# Patient Record
Sex: Female | Born: 1970 | Race: White | Hispanic: No | Smoking: Current every day smoker
Health system: Southern US, Community
[De-identification: ages and names within clinical notes are randomized; demographics above are authoritative.]

## PROBLEM LIST (undated history)

## (undated) DIAGNOSIS — J9819 Other pulmonary collapse: Secondary | ICD-10-CM

## (undated) DIAGNOSIS — F329 Major depressive disorder, single episode, unspecified: Secondary | ICD-10-CM

## (undated) DIAGNOSIS — E7800 Pure hypercholesterolemia, unspecified: Secondary | ICD-10-CM

## (undated) DIAGNOSIS — J449 Chronic obstructive pulmonary disease, unspecified: Secondary | ICD-10-CM

## (undated) DIAGNOSIS — F32A Depression, unspecified: Secondary | ICD-10-CM

## (undated) DIAGNOSIS — I10 Essential (primary) hypertension: Secondary | ICD-10-CM

## (undated) HISTORY — PX: ENDOMETRIAL ABLATION: SHX621

## (undated) HISTORY — PX: CHEST TUBE INSERTION: SHX231

---

## 2018-01-19 ENCOUNTER — Emergency Department (HOSPITAL_COMMUNITY): Payer: BLUE CROSS/BLUE SHIELD

## 2018-01-19 ENCOUNTER — Emergency Department (HOSPITAL_COMMUNITY)
Admission: EM | Admit: 2018-01-19 | Discharge: 2018-01-19 | Disposition: A | Discharge status: Home / Self Care | Payer: BLUE CROSS/BLUE SHIELD | Attending: Emergency Medicine | Admitting: Emergency Medicine

## 2018-01-19 ENCOUNTER — Encounter (HOSPITAL_COMMUNITY): Payer: Self-pay | Admitting: *Deleted

## 2018-01-19 ENCOUNTER — Other Ambulatory Visit: Payer: Self-pay

## 2018-01-19 DIAGNOSIS — E119 Type 2 diabetes mellitus without complications: Secondary | ICD-10-CM | POA: Insufficient documentation

## 2018-01-19 DIAGNOSIS — I10 Essential (primary) hypertension: Secondary | ICD-10-CM | POA: Insufficient documentation

## 2018-01-19 DIAGNOSIS — Z79899 Other long term (current) drug therapy: Secondary | ICD-10-CM | POA: Diagnosis not present

## 2018-01-19 DIAGNOSIS — R079 Chest pain, unspecified: Secondary | ICD-10-CM

## 2018-01-19 DIAGNOSIS — R0789 Other chest pain: Secondary | ICD-10-CM | POA: Insufficient documentation

## 2018-01-19 DIAGNOSIS — F17200 Nicotine dependence, unspecified, uncomplicated: Secondary | ICD-10-CM | POA: Diagnosis not present

## 2018-01-19 HISTORY — DX: Essential (primary) hypertension: I10

## 2018-01-19 HISTORY — DX: Major depressive disorder, single episode, unspecified: F32.9

## 2018-01-19 HISTORY — DX: Depression, unspecified: F32.A

## 2018-01-19 HISTORY — DX: Other pulmonary collapse: J98.19

## 2018-01-19 HISTORY — DX: Pure hypercholesterolemia, unspecified: E78.00

## 2018-01-19 LAB — BASIC METABOLIC PANEL
ANION GAP: 10 (ref 5–15)
BUN: 12 mg/dL (ref 6–20)
CO2: 25 mmol/L (ref 22–32)
Calcium: 8.8 mg/dL — ABNORMAL LOW (ref 8.9–10.3)
Chloride: 102 mmol/L (ref 98–111)
Creatinine, Ser: 0.91 mg/dL (ref 0.44–1.00)
GFR calc Af Amer: >60 mL/min (ref >60)
GFR calc non Af Amer: >60 mL/min (ref >60)
GLUCOSE: 106 mg/dL — AB (ref 70–99)
POTASSIUM: 3.2 mmol/L — AB (ref 3.5–5.1)
Sodium: 137 mmol/L (ref 135–145)

## 2018-01-19 LAB — CBC WITH DIFFERENTIAL/PLATELET
BASOS ABS: 0.1 10*3/uL (ref 0.0–0.1)
Basophils Relative: 1 %
Eosinophils Absolute: 0.2 10*3/uL (ref 0.0–0.7)
Eosinophils Relative: 3 %
HEMATOCRIT: 40.6 % (ref 36.0–46.0)
Hemoglobin: 13.9 g/dL (ref 12.0–15.0)
LYMPHS PCT: 30 %
Lymphs Abs: 2.1 10*3/uL (ref 0.7–4.0)
MCH: 32.6 pg (ref 26.0–34.0)
MCHC: 34.2 g/dL (ref 30.0–36.0)
MCV: 95.1 fL (ref 78.0–100.0)
MONO ABS: 0.5 10*3/uL (ref 0.1–1.0)
MONOS PCT: 7 %
NEUTROS ABS: 4.2 10*3/uL (ref 1.7–7.7)
Neutrophils Relative %: 59 %
Platelets: 288 10*3/uL (ref 150–400)
RBC: 4.27 MIL/uL (ref 3.87–5.11)
RDW: 13.9 % (ref 11.5–15.5)
WBC: 7 10*3/uL (ref 4.0–10.5)

## 2018-01-19 LAB — TROPONIN I: Troponin I: <0.03 ng/mL (ref <0.03)

## 2018-01-19 NOTE — ED Triage Notes (Signed)
Pt c/o left chest pain that goes into the left axilla along with SOB, nausea, dizziness x 1 week. Pt reports her Klonopin was recently decreased and she feels this may be related. Pt reports she also changed job positions 2 months ago and has been doing heavy lifting.

## 2018-01-19 NOTE — Discharge Instructions (Addendum)
To help treat the pain, take ibuprofen 3 times a day with meals.  We will also help to use heat on the sore area 3 or 4 times a day.  Additionally stopping smoking will likely improve your discomfort.  If you began coughing or develop a fever, please see your doctor for further treatment.  Avoid lifting as much as possible for several days.

## 2018-01-19 NOTE — ED Provider Notes (Signed)
[facility] EMERGENCY DEPARTMENT Provider Note   CSN: [id number] Arrival date & time: 01/19/18  1841     History   Chief Complaint Chief Complaint  Patient presents with  . Chest Pain    HPI Sherry [name] is a 47 y.o. female.  HPI   She presents for evaluation of pain left axilla, fairly constant for 4 days.  No specific known trauma although she changed her job about 2 weeks ago and now is lifting more than usual.  She denies fever, chills, cough, persistent shortness of breath.  There is no change in her pain going from supine to sitting.  She denies nausea, vomiting, weakness or dizziness.  No prior similar problem.  She smokes cigarettes.  She is taking her usual medications as prescribed.  She has a history of "lung collapse, left-sided.  She denies breast tenderness, nipple discharge or mass.  She states she had a normal mammogram, 2 months ago.  There are no other known modifying factors.  Past Medical History:  Diagnosis Date  . Depression   . Diabetes mellitus without complication ([place])   . High cholesterol   . Hypertension   . Lung collapse    left    There are no active problems to display for this patient.   Past Surgical History:  Procedure Laterality Date  . ENDOMETRIAL ABLATION       OB History   None      Home Medications    Prior to Admission medications   Medication Sig Start Date End Date Taking? Authorizing Provider  acetaminophen (TYLENOL) 650 MG CR tablet Take by mouth.   Yes [provider]  amitriptyline (ELAVIL) 50 MG tablet Take 50 mg by mouth daily.  01/07/16  Yes [provider]  clonazePAM (KLONOPIN) 0.5 MG tablet TK SS T PO QAM AND 1 T PO QHS 01/04/18  Yes [provider]  hydrochlorothiazide (HYDRODIURIL) 25 MG tablet Take 25 mg by mouth daily.  02/11/16  Yes [provider]  ibuprofen (ADVIL,MOTRIN) 200 MG tablet Take 200 mg by mouth every 6 (six) hours as needed for headache or mild pain.   Yes  [provider]  magnesium 30 MG tablet Take 250 mg by mouth daily.   Yes [provider]  pantoprazole (PROTONIX) 40 MG tablet TAKE (1) TABLET DAILY 12/09/15  Yes [provider]  QUEtiapine (SEROQUEL) 100 MG tablet Take 100 mg by mouth at bedtime.  02/11/16  Yes [provider]  rosuvastatin (CRESTOR) 40 MG tablet Take 40 mg by mouth daily.  02/10/16  Yes [provider]    Family History No family history on file.  Social History Social History   Tobacco Use  . Smoking status: Current Every Day Smoker  . Smokeless tobacco: Never Used  Substance Use Topics  . Alcohol use: Yes    Comment: occasionally   . Drug use: Never     Allergies   Dimenhydrinate   Review of Systems Review of Systems  All other systems reviewed and are negative.    Physical Exam Updated Vital Signs BP 110/76   Pulse 68   Temp 97.9 F (36.6 C) (Oral)   Resp 18   Ht 5\' 5"  (1.651 m)   Wt 64.4 kg (142 lb)   SpO2 100%   BMI 23.63 kg/m   Physical Exam  Constitutional: She is oriented to person, place, and time. She appears well-developed and well-nourished. No distress.  HENT:  Head: Normocephalic and atraumatic.  Right Ear: External ear normal.  Left Ear: External ear normal.  Eyes: Pupils are equal, round, and reactive to light. Conjunctivae and EOM are normal.  Neck: Normal range of motion and phonation normal. Neck supple.  Cardiovascular: Normal rate, regular rhythm and normal heart sounds.  Pulmonary/Chest: Effort normal and breath sounds normal. No respiratory distress. She exhibits tenderness (Moderate left upper and left axillary tenderness along the chest wall.  No associated deformity.). She exhibits no bony tenderness.  Abdominal: Soft. There is no tenderness.  Musculoskeletal: Normal range of motion. She exhibits no edema, tenderness or deformity.  Neurological: She is alert and oriented to person, place, and time. No cranial nerve deficit  or sensory deficit. She exhibits normal muscle tone. Coordination normal.  Skin: Skin is warm, dry and intact.  Psychiatric: She has a normal mood and affect. Her behavior is normal. Judgment and thought content normal.  Nursing note and vitals reviewed.    ED Treatments / Results  Labs (all labs ordered are listed, but only abnormal results are displayed) Labs Reviewed  BASIC METABOLIC PANEL - Abnormal; Notable for the following components:      Result Value   Potassium 3.2 (*)    Glucose, Bld 106 (*)    Calcium 8.8 (*)    All other components within normal limits  TROPONIN I  CBC WITH DIFFERENTIAL/PLATELET    EKG EKG Interpretation  Date/Time:  Thursday January 19 2018 18:52:09 EDT Ventricular Rate:  79 PR Interval:    QRS Duration: 94 QT Interval:  409 QTC Calculation: 469 R Axis:   92 Text Interpretation:  Sinus rhythm Consider left atrial enlargement Borderline right axis deviation Borderline T abnormalities, anterior leads No old tracing to compare Confirmed by Daleen Bo [phone]) on 01/19/2018 8:27:06 PM   Radiology Dg Chest 2 View  Result Date: 01/19/2018 CLINICAL DATA:  left chest pain that goes into the left axilla along with SOB, nausea, dizziness x 1 week. History of DM, HTN, collapsed lung. EXAM: CHEST - 2 VIEW COMPARISON:  None. FINDINGS: Midline trachea. Normal heart size and mediastinal contours. No pleural effusion or pneumothorax. Mild biapical pleural thickening. Moderate diffuse pulmonary interstitial thickening. No lobar consolidation. IMPRESSION: No acute cardiopulmonary disease. Peribronchial thickening which may relate to chronic bronchitis or smoking. Electronically Signed   By: Abigail Miyamoto M.D.   On: 01/19/2018 20:04    Procedures Procedures (including critical care time)  Medications Ordered in ED Medications - No data to display   Initial Impression / Assessment and Plan / ED Course  I have reviewed the triage vital signs and the nursing  notes.  Pertinent labs & imaging results that were available during my care of the patient were reviewed by me and considered in my medical decision making (see chart for details).  Clinical Course as of Jan 20 2123  Thu Jan 19, 2018  2026 Normal except potassium low, glucose high, calcium low  Basic metabolic panel(!) [EW]  6433 Normal  Troponin I [EW]  2026 Normal  CBC with Differential [EW]  2026 Abnormal, consistent with bronchitis  DG Chest 2 View [EW]  2034 Normal  Basic metabolic panel(!) [EW]  2951 Normal  Troponin I [EW]  2034 normal  CBC with Differential [EW]    Clinical Course User Index [EW] Daleen Bo, MD     Patient Vitals for the past 24 hrs:  BP Temp Temp src Pulse Resp SpO2 Height Weight  01/19/18 2030 110/76 - - 68 18 100 % - -  01/19/18 2000 102/71 - - 70 15 98 % - -  01/19/18 1900 120/83 - - 77 17 100 % - -  01/19/18 1853 114/77 97.9 F (36.6 C) Oral 80 20 98 % - -  01/19/18 1852 - - - - - - 5\' 5"  (1.651 m) 64.4 kg (142 lb)    At discharge- reevaluation with update and discussion. After initial assessment and treatment, an updated evaluation reveals she is comfortable has no further complaints.  Findings discussed and questions answered. Daleen Bo   Medical Decision Making: Nonspecific chest pain, doubt ACS, PE or pneumonia.  Patient is a smoker.  No good evidence for bronchitis at this time.  Likely component of chest wall pain, related to work efforts.  CRITICAL CARE-no Performed by: Daleen Bo   Nursing Notes Reviewed/ Care Coordinated Applicable Imaging Reviewed Interpretation of Laboratory Data incorporated into ED treatment  The patient appears reasonably screened and/or stabilized for discharge and I doubt any other medical condition or other [facility] requiring further screening, evaluation, or treatment in the ED at this time prior to discharge.  Plan: Home Medications-continue usual medications, ibuprofen for pain relief; Home  Treatments-heat to affected area; return here if the recommended treatment, does not improve the symptoms; Recommended follow up-PCP, as needed    Final Clinical Impressions(s) / ED Diagnoses   Final diagnoses:  Nonspecific chest pain    ED Discharge Orders    None       Daleen Bo, MD 01/19/18 2125

## 2018-02-26 ENCOUNTER — Encounter (HOSPITAL_COMMUNITY): Payer: Self-pay | Admitting: *Deleted

## 2018-02-26 ENCOUNTER — Emergency Department (HOSPITAL_COMMUNITY)
Admission: EM | Admit: 2018-02-26 | Discharge: 2018-02-26 | Disposition: A | Discharge status: Home / Self Care | Payer: BLUE CROSS/BLUE SHIELD | Attending: Emergency Medicine | Admitting: Emergency Medicine

## 2018-02-26 ENCOUNTER — Emergency Department (HOSPITAL_COMMUNITY): Payer: BLUE CROSS/BLUE SHIELD

## 2018-02-26 ENCOUNTER — Other Ambulatory Visit: Payer: Self-pay

## 2018-02-26 DIAGNOSIS — R0789 Other chest pain: Secondary | ICD-10-CM | POA: Diagnosis not present

## 2018-02-26 DIAGNOSIS — F17210 Nicotine dependence, cigarettes, uncomplicated: Secondary | ICD-10-CM | POA: Diagnosis not present

## 2018-02-26 DIAGNOSIS — I10 Essential (primary) hypertension: Secondary | ICD-10-CM | POA: Insufficient documentation

## 2018-02-26 DIAGNOSIS — Z79899 Other long term (current) drug therapy: Secondary | ICD-10-CM | POA: Insufficient documentation

## 2018-02-26 DIAGNOSIS — R079 Chest pain, unspecified: Secondary | ICD-10-CM | POA: Diagnosis present

## 2018-02-26 DIAGNOSIS — E7800 Pure hypercholesterolemia, unspecified: Secondary | ICD-10-CM | POA: Insufficient documentation

## 2018-02-26 LAB — TROPONIN I: Troponin I: <0.03 ng/mL (ref <0.03)

## 2018-02-26 LAB — BASIC METABOLIC PANEL
Anion gap: 8 (ref 5–15)
BUN: 14 mg/dL (ref 6–20)
CO2: 28 mmol/L (ref 22–32)
CREATININE: 0.74 mg/dL (ref 0.44–1.00)
Calcium: 9.2 mg/dL (ref 8.9–10.3)
Chloride: 103 mmol/L (ref 98–111)
GFR calc non Af Amer: >60 mL/min (ref >60)
GLUCOSE: 102 mg/dL — AB (ref 70–99)
Potassium: 3.4 mmol/L — ABNORMAL LOW (ref 3.5–5.1)
Sodium: 139 mmol/L (ref 135–145)

## 2018-02-26 LAB — CBC
HCT: 42.3 % (ref 36.0–46.0)
Hemoglobin: 14.2 g/dL (ref 12.0–15.0)
MCH: 32.2 pg (ref 26.0–34.0)
MCHC: 33.6 g/dL (ref 30.0–36.0)
MCV: 95.9 fL (ref 78.0–100.0)
Platelets: 298 10*3/uL (ref 150–400)
RBC: 4.41 MIL/uL (ref 3.87–5.11)
RDW: 13.9 % (ref 11.5–15.5)
WBC: 7.3 10*3/uL (ref 4.0–10.5)

## 2018-02-26 LAB — LIPASE, BLOOD: Lipase: 30 U/L (ref 11–51)

## 2018-02-26 MED ORDER — ONDANSETRON HCL 4 MG/2ML IJ SOLN
4.0000 mg | Freq: Once | INTRAMUSCULAR | Status: AC
  Administered 2018-02-26: 4 mg via INTRAVENOUS
  Filled 2018-02-26: qty 2

## 2018-02-26 MED ORDER — IBUPROFEN 600 MG PO TABS: 600.0000 mg | ORAL_TABLET | Freq: Four times a day (QID) | ORAL | 0 refills | Status: DC | PRN

## 2018-02-26 MED ORDER — MORPHINE SULFATE (PF) 4 MG/ML IV SOLN
4.0000 mg | Freq: Once | INTRAVENOUS | Status: AC
  Administered 2018-02-26: 4 mg via INTRAVENOUS
  Filled 2018-02-26: qty 1

## 2018-02-26 MED ORDER — KETOROLAC TROMETHAMINE 30 MG/ML IJ SOLN
30.0000 mg | Freq: Once | INTRAMUSCULAR | Status: AC
  Administered 2018-02-26: 30 mg via INTRAVENOUS
  Filled 2018-02-26: qty 1

## 2018-02-26 MED ORDER — HYDROCODONE-ACETAMINOPHEN 5-325 MG PO TABS: 1.0000 | ORAL_TABLET | ORAL | 0 refills | Status: DC | PRN

## 2018-02-26 NOTE — ED Provider Notes (Signed)
[facility] EMERGENCY DEPARTMENT Provider Note   CSN: [id number] Arrival date & time: 02/26/18  1544     History   Chief Complaint Chief Complaint  Patient presents with  . Chest Pain    HPI Sherry [name] is a 47 y.o. female.  Pt presents to the ED today with CP.  The pt said it started yesterday when she was getting into a friend's pool.  She was climbing down a ladder, but facing away from the ladder.  She felt a sudden pain as she was climbing.  It hurts with movement and with a deep breath.  She has never had anything like this in the past.     Past Medical History:  Diagnosis Date  . Depression   . High cholesterol   . Hypertension   . Lung collapse    left    There are no active problems to display for this patient.   Past Surgical History:  Procedure Laterality Date  . CHEST TUBE INSERTION    . ENDOMETRIAL ABLATION       OB History   None      Home Medications    Prior to Admission medications   Medication Sig Start Date End Date Taking? Authorizing Provider  acetaminophen (TYLENOL) 650 MG CR tablet Take by mouth.    [provider]  amitriptyline (ELAVIL) 50 MG tablet Take 50 mg by mouth daily.  01/07/16   [provider]  clonazePAM (KLONOPIN) 0.5 MG tablet TK SS T PO QAM AND 1 T PO QHS 01/04/18   [provider]  hydrochlorothiazide (HYDRODIURIL) 25 MG tablet Take 25 mg by mouth daily.  02/11/16   [provider]  HYDROcodone-acetaminophen (NORCO/VICODIN) 5-325 MG tablet Take 1 tablet by mouth every 4 (four) hours as needed. 02/26/18   Isla Pence, MD  ibuprofen (ADVIL,MOTRIN) 600 MG tablet Take 1 tablet (600 mg total) by mouth every 6 (six) hours as needed. 02/26/18   Isla Pence, MD  magnesium 30 MG tablet Take 250 mg by mouth daily.    [provider]  pantoprazole (PROTONIX) 40 MG tablet TAKE (1) TABLET DAILY 12/09/15   [provider]  QUEtiapine (SEROQUEL) 100 MG tablet Take 100 mg by mouth  at bedtime.  02/11/16   [provider]  rosuvastatin (CRESTOR) 40 MG tablet Take 40 mg by mouth daily.  02/10/16   [provider]    Family History No family history on file.  Social History Social History   Tobacco Use  . Smoking status: Current Every Day Smoker    Packs/day: 1.00    Types: Cigarettes  . Smokeless tobacco: Never Used  Substance Use Topics  . Alcohol use: Yes    Comment: occasionally   . Drug use: Never     Allergies   Dimenhydrinate   Review of Systems Review of Systems  Cardiovascular: Positive for chest pain.  All other systems reviewed and are negative.    Physical Exam Updated Vital Signs BP 98/63   Pulse 63   Temp 97.8 F (36.6 C) (Oral)   Resp 16   Ht 5' 5.5" (1.664 m)   Wt 61.2 kg   SpO2 96%   BMI 22.12 kg/m   Physical Exam  Constitutional: She is oriented to person, place, and time. She appears well-developed and well-nourished.  HENT:  Head: Normocephalic and atraumatic.  Eyes: Pupils are equal, round, and reactive to light. EOM are normal.  Neck: Normal range of motion. Neck supple.  Cardiovascular: Normal rate and regular rhythm.  Pulmonary/Chest: Effort normal and breath sounds normal.    Abdominal: Soft. Bowel sounds are normal.  Musculoskeletal: Normal range of motion.       Right lower leg: Normal.       Left lower leg: Normal.  Neurological: She is alert and oriented to person, place, and time.  Skin: Skin is warm and dry. Capillary refill takes less than 2 seconds.  Psychiatric: She has a normal mood and affect. Her behavior is normal.  Nursing note and vitals reviewed.    ED Treatments / Results  Labs (all labs ordered are listed, but only abnormal results are displayed) Labs Reviewed  BASIC METABOLIC PANEL - Abnormal; Notable for the following components:      Result Value   Potassium 3.4 (*)    Glucose, Bld 102 (*)    All other components within normal limits  CBC  TROPONIN I  LIPASE,  BLOOD    EKG EKG Interpretation  Date/Time:  Sunday February 26 2018 15:56:51 EDT Ventricular Rate:  78 PR Interval:  146 QRS Duration: 82 QT Interval:  378 QTC Calculation: 430 R Axis:   103 Text Interpretation:  Normal sinus rhythm Right atrial enlargement Rightward axis ST & T wave abnormality, consider anterolateral ischemia Abnormal ECG No significant change since last tracing Confirmed by Jolynda Townley ([id number]) on 02/26/2018 4:02:57 PM   Radiology Dg Chest 2 View  Result Date: 02/26/2018 CLINICAL DATA:  Sharp intermittent midsternal chest pain. EXAM: CHEST - 2 VIEW COMPARISON:  01/19/2018 FINDINGS: Cardiomediastinal silhouette is normal. Mediastinal contours appear intact. There is no evidence of focal airspace consolidation, pleural effusion or pneumothorax. Coarsening of the interstitium and peribronchial thickening, not significantly changed. Osseous structures are without acute abnormality. Soft tissues are grossly normal. IMPRESSION: No lobar consolidation. Coarsening of the interstitium and peribronchial thickening, with chronic appearance likely related to a chronic interstitial lung changes. Electronically Signed   By: Dobrinka  Dimitrova M.D.   On: 02/26/2018 16:59    Procedures Procedures (including critical care time)  Medications Ordered in ED Medications  ketorolac (TORADOL) 30 MG/ML injection 30 mg (has no administration in time range)  morphine 4 MG/ML injection 4 mg (has no administration in time range)  morphine 4 MG/ML injection 4 mg (4 mg Intravenous Given 02/26/18 1630)  ondansetron (ZOFRAN) injection 4 mg (4 mg Intravenous Given 02/26/18 1632)     Initial Impression / Assessment and Plan / ED Course  I have reviewed the triage vital signs and the nursing notes.  Pertinent labs & imaging results that were available during my care of the patient were reviewed by me and considered in my medical decision making (see chart for details).    Pain is very  pleuritic.  Very low suspicion for CAD.  Pt is stable for d/c  Return if worse.  Final Clinical Impressions(s) / ED Diagnoses   Final diagnoses:  Chest wall pain    ED Discharge Orders         Ordered    HYDROcodone-acetaminophen (NORCO/VICODIN) 5-325 MG tablet  Every 4 hours PRN     02/26/18 1713    ibuprofen (ADVIL,MOTRIN) 600 MG tablet  Every 6 hours PRN     08 /18/19 [place], Anacaren Kohan, MD 02/26/18 1734

## 2018-02-26 NOTE — ED Triage Notes (Addendum)
Pt c/o pulling sensation with intermittent sharp pain to mid chest that started yesterday when she was climbing down the stairs into a pool. No radiation of pain. Pt reports SOB when the pain gets really bad. Denies n/v, dizziness.

## 2018-02-26 NOTE — ED Notes (Signed)
Pt in rad 

## 2018-03-06 ENCOUNTER — Emergency Department (HOSPITAL_COMMUNITY): Payer: BLUE CROSS/BLUE SHIELD

## 2018-03-06 ENCOUNTER — Encounter (HOSPITAL_COMMUNITY): Payer: Self-pay | Admitting: Emergency Medicine

## 2018-03-06 ENCOUNTER — Emergency Department (HOSPITAL_COMMUNITY)
Admission: EM | Admit: 2018-03-06 | Discharge: 2018-03-07 | Disposition: A | Discharge status: Home / Self Care | Payer: BLUE CROSS/BLUE SHIELD | Attending: Emergency Medicine | Admitting: Emergency Medicine

## 2018-03-06 ENCOUNTER — Other Ambulatory Visit: Payer: Self-pay

## 2018-03-06 DIAGNOSIS — F17210 Nicotine dependence, cigarettes, uncomplicated: Secondary | ICD-10-CM | POA: Insufficient documentation

## 2018-03-06 DIAGNOSIS — R0789 Other chest pain: Secondary | ICD-10-CM | POA: Diagnosis not present

## 2018-03-06 DIAGNOSIS — Z79899 Other long term (current) drug therapy: Secondary | ICD-10-CM | POA: Insufficient documentation

## 2018-03-06 DIAGNOSIS — I10 Essential (primary) hypertension: Secondary | ICD-10-CM | POA: Insufficient documentation

## 2018-03-06 DIAGNOSIS — R079 Chest pain, unspecified: Secondary | ICD-10-CM | POA: Diagnosis present

## 2018-03-06 LAB — COMPREHENSIVE METABOLIC PANEL
ALT: 11 U/L (ref 0–44)
ANION GAP: 9 (ref 5–15)
AST: 14 U/L — ABNORMAL LOW (ref 15–41)
Albumin: 4.2 g/dL (ref 3.5–5.0)
Alkaline Phosphatase: 70 U/L (ref 38–126)
BILIRUBIN TOTAL: 0.4 mg/dL (ref 0.3–1.2)
BUN: 13 mg/dL (ref 6–20)
CALCIUM: 9.7 mg/dL (ref 8.9–10.3)
CO2: 27 mmol/L (ref 22–32)
Chloride: 103 mmol/L (ref 98–111)
Creatinine, Ser: 0.81 mg/dL (ref 0.44–1.00)
GFR calc non Af Amer: >60 mL/min (ref >60)
Glucose, Bld: 99 mg/dL (ref 70–99)
Potassium: 3.9 mmol/L (ref 3.5–5.1)
Sodium: 139 mmol/L (ref 135–145)
TOTAL PROTEIN: 7.3 g/dL (ref 6.5–8.1)

## 2018-03-06 LAB — CBC
HEMATOCRIT: 41.5 % (ref 36.0–46.0)
Hemoglobin: 14 g/dL (ref 12.0–15.0)
MCH: 32.7 pg (ref 26.0–34.0)
MCHC: 33.7 g/dL (ref 30.0–36.0)
MCV: 97 fL (ref 78.0–100.0)
Platelets: 341 10*3/uL (ref 150–400)
RBC: 4.28 MIL/uL (ref 3.87–5.11)
RDW: 14.6 % (ref 11.5–15.5)
WBC: 10.5 10*3/uL (ref 4.0–10.5)

## 2018-03-06 LAB — TROPONIN I: Troponin I: <0.03 ng/mL (ref <0.03)

## 2018-03-06 NOTE — ED Triage Notes (Signed)
Pt states she was here one week ago because her "chest popped." Pt states that today she was at work and when she moves her chest hurts worse. Pt states the pain feels the same as last week.

## 2018-03-07 LAB — D-DIMER, QUANTITATIVE: D-Dimer, Quant: <0.27 ug/mL-FEU (ref 0.00–0.50)

## 2018-03-07 MED ORDER — TRAMADOL HCL 50 MG PO TABS
100.0000 mg | ORAL_TABLET | Freq: Once | ORAL | Status: AC
  Administered 2018-03-07: 100 mg via ORAL
  Filled 2018-03-07: qty 2

## 2018-03-07 MED ORDER — ONDANSETRON HCL 4 MG PO TABS
4.0000 mg | ORAL_TABLET | Freq: Once | ORAL | Status: AC
  Administered 2018-03-07: 4 mg via ORAL
  Filled 2018-03-07: qty 1

## 2018-03-07 MED ORDER — MELOXICAM 15 MG PO TABS: 15.0000 mg | ORAL_TABLET | Freq: Every day | ORAL | 0 refills | Status: DC

## 2018-03-07 MED ORDER — KETOROLAC TROMETHAMINE 10 MG PO TABS
10.0000 mg | ORAL_TABLET | Freq: Once | ORAL | Status: AC
  Administered 2018-03-07: 10 mg via ORAL
  Filled 2018-03-07: qty 1

## 2018-03-07 MED ORDER — DEXAMETHASONE SODIUM PHOSPHATE 10 MG/ML IJ SOLN
10.0000 mg | Freq: Once | INTRAMUSCULAR | Status: AC
  Administered 2018-03-07: 10 mg via INTRAMUSCULAR
  Filled 2018-03-07: qty 1

## 2018-03-07 MED ORDER — TRAMADOL HCL 50 MG PO TABS: ORAL_TABLET | ORAL | 0 refills | Status: DC

## 2018-03-07 NOTE — ED Provider Notes (Signed)
[facility] EMERGENCY DEPARTMENT Provider Note   CSN: [id number] Arrival date & time: 03/06/18  2116     History   Chief Complaint Chief Complaint  Patient presents with  . Chest Pain    HPI Sherry [name] is a 47 y.o. female.  Patient is a 47 year old female who presents to the emergency department with complaint of chest pain. Patient states that on August 18 she developed pain in her chest.  The pain hurt when she would take a deep breath.  She felt like she felt a pop in her chest.  She was diagnosed with chest wall pain.  She was then evaluated by her primary physician, and taken out of work until today.  The patient states after 3 hours of work today the pain was severe.  The patient states that she has to do a lot of pulling and pushing, and at times she has to lift a few heavy objects.  She says she could not take the pain any longer and came to the emergency department.  She now has pain when she takes a deep breath or when she turns in a certain position.  She presents now for assistance with this pain.  The history is provided by the patient.  Chest Pain   Pertinent negatives include no abdominal pain, no back pain, no cough, no dizziness, no fever, no palpitations and no shortness of breath.  Pertinent negatives for past medical history include no seizures.    Past Medical History:  Diagnosis Date  . Depression   . High cholesterol   . Hypertension   . Lung collapse    left    There are no active problems to display for this patient.   Past Surgical History:  Procedure Laterality Date  . CHEST TUBE INSERTION    . ENDOMETRIAL ABLATION       OB History   None      Home Medications    Prior to Admission medications   Medication Sig Start Date End Date Taking? Authorizing Provider  acetaminophen (TYLENOL) 650 MG CR tablet Take 650 mg by mouth every 8 (eight) hours as needed for pain.    Yes [provider]  amitriptyline (ELAVIL) 50 MG tablet Take  50 mg by mouth every morning.  01/07/16  Yes [provider]  clonazePAM (KLONOPIN) 0.5 MG tablet Take 0.5 mg by mouth 2 (two) times daily.  01/04/18  Yes [provider]  hydrochlorothiazide (HYDRODIURIL) 25 MG tablet Take 25 mg by mouth daily.  02/11/16  Yes [provider]  ibuprofen (ADVIL,MOTRIN) 600 MG tablet Take 1 tablet (600 mg total) by mouth every 6 (six) hours as needed. 02/26/18  Yes Isla Pence, MD  Magnesium 250 MG TABS Take 250 mg by mouth daily as needed (for cramping).    Yes [provider]  pantoprazole (PROTONIX) 40 MG tablet Take 40 mg by mouth daily.  12/09/15  Yes [provider]  QUEtiapine (SEROQUEL) 100 MG tablet Take 100 mg by mouth at bedtime.  02/11/16  Yes [provider]  rosuvastatin (CRESTOR) 40 MG tablet Take 40 mg by mouth every morning.  02/10/16  Yes [provider]  HYDROcodone-acetaminophen (NORCO/VICODIN) 5-325 MG tablet Take 1 tablet by mouth every 4 (four) hours as needed. Patient not taking: Reported on 03/06/2018 02/26/18   Isla Pence, MD    Family History No family history on file.  Social History Social History   Tobacco Use  . Smoking status: Current  Every Day Smoker    Packs/day: 1.00    Types: Cigarettes  . Smokeless tobacco: Never Used  Substance Use Topics  . Alcohol use: Yes    Comment: occasionally   . Drug use: Never     Allergies   Dimenhydrinate   Review of Systems Review of Systems  Constitutional: Negative for activity change, chills and fever.       All ROS Neg except as noted in HPI  HENT: Negative for nosebleeds.   Eyes: Negative for photophobia and discharge.  Respiratory: Negative for cough, shortness of breath and wheezing.   Cardiovascular: Positive for chest pain. Negative for palpitations.  Gastrointestinal: Negative for abdominal pain and blood in stool.  Genitourinary: Negative for dysuria, frequency and hematuria.  Musculoskeletal: Negative for  arthralgias, back pain and neck pain.  Skin: Negative.   Neurological: Negative for dizziness, seizures and speech difficulty.  Psychiatric/Behavioral: Negative for confusion and hallucinations.     Physical Exam Updated Vital Signs BP 112/70 (BP Location: Right Arm)   Pulse 81   Temp 97.8 F (36.6 C) (Oral)   Resp 17   SpO2 98%   Physical Exam  Pulmonary/Chest: She exhibits tenderness. She exhibits no crepitus and no retraction.  Pt's husband in room during exam Chest pain at multiple sites with palpation as well as with taking a deep breath.       ED Treatments / Results  Labs (all labs ordered are listed, but only abnormal results are displayed) Labs Reviewed  COMPREHENSIVE METABOLIC PANEL - Abnormal; Notable for the following components:      Result Value   AST 14 (*)    All other components within normal limits  TROPONIN I  CBC    EKG None  Radiology Dg Chest 2 View  Result Date: 03/06/2018 CLINICAL DATA:  Mid chest pain and shortness of breath for a week. Cough for 2 days. EXAM: CHEST - 2 VIEW COMPARISON:  Chest radiograph February 26, 2018 and January 19, 2018 FINDINGS: Cardiomediastinal silhouette is normal. Similarly coarsened pulmonary interstitium. No pleural effusions or focal consolidations. Trachea projects midline and there is no pneumothorax. Biapical pleural thickening. Soft tissue planes and included osseous structures are non-suspicious. IMPRESSION: Similar coarsened pulmonary interstitium, possible chronic interstitial lung disease. Electronically Signed   By: Elon Alas M.D.   On: 03/06/2018 22:30    Procedures Procedures (including critical care time)  Medications Ordered in ED Medications  dexamethasone (DECADRON) injection 10 mg (has no administration in time range)  traMADol (ULTRAM) tablet 100 mg (has no administration in time range)  ketorolac (TORADOL) tablet 10 mg (has no administration in time range)  ondansetron (ZOFRAN) tablet 4  mg (has no administration in time range)     Initial Impression / Assessment and Plan / ED Course  I have reviewed the triage vital signs and the nursing notes.  Pertinent labs & imaging results that were available during my care of the patient were reviewed by me and considered in my medical decision making (see chart for details).       Final Clinical Impressions(s) / ED Diagnoses MDM  Vital signs are within normal limits.  Pulse oximetry is 98% on room air.  Within normal limits by my interpretation.  Troponin is negative for acute cardiac event.  Complete blood count is negative for acute problem. Comprehensive metabolic panel is well within normal limits. D-dimer is negative for acute event.  Patient feels some better after pain medication.  I reviewed the findings  with the patient in terms which she understands.  Patient was given a note to return to work in 1 week.  I have asked the patient to call her primary doctor tomorrow and set up an appointment for follow-up and recheck.  Patient is in agreement with this plan.    Final diagnoses:  Chest wall pain    ED Discharge Orders         Ordered    traMADol (ULTRAM) 50 MG tablet     03/07/18 0146    meloxicam (MOBIC) 15 MG tablet  Daily     03/07/18 0146           Lily Kocher, PA-C 03/07/18 0159    Ezequiel Essex, MD 03/07/18 [phone]

## 2018-03-07 NOTE — ED Provider Notes (Signed)
ED ECG REPORT   Date: 03/07/2018  Rate: 82  Rhythm: normal sinus rhythm  QRS Axis: normal  Intervals: normal  ST/T Wave abnormalities: nonspecific ST changes  Conduction Disutrbances:none  Narrative Interpretation:   Old EKG Reviewed: unchanged  I have personally reviewed the EKG tracing and agree with the computerized printout as noted.    Ezequiel Essex, MD 03/07/18 [phone]

## 2018-03-07 NOTE — Discharge Instructions (Addendum)
Your chest x-ray is negative for fracture or dislocation.  Your chest x-ray does show some chronic markings probably related to smoking.  Please stop smoking.  No acute problem with review of blood work. Use meloxicam 1 tablet daily with food.  Use Ultram every 6 hours as needed for pain.  Please see Dr. Yong Channel in the office for follow-up concerning your chest wall pain.

## 2018-06-05 ENCOUNTER — Other Ambulatory Visit: Payer: Self-pay

## 2018-06-05 ENCOUNTER — Encounter (HOSPITAL_COMMUNITY): Payer: Self-pay | Admitting: Emergency Medicine

## 2018-06-05 ENCOUNTER — Emergency Department (HOSPITAL_COMMUNITY)
Admission: EM | Admit: 2018-06-05 | Discharge: 2018-06-06 | Disposition: A | Discharge status: Home / Self Care | Payer: Self-pay | Attending: Emergency Medicine | Admitting: Emergency Medicine

## 2018-06-05 ENCOUNTER — Emergency Department (HOSPITAL_COMMUNITY): Payer: Self-pay

## 2018-06-05 DIAGNOSIS — Z760 Encounter for issue of repeat prescription: Secondary | ICD-10-CM | POA: Insufficient documentation

## 2018-06-05 DIAGNOSIS — R0789 Other chest pain: Secondary | ICD-10-CM | POA: Insufficient documentation

## 2018-06-05 DIAGNOSIS — Z79899 Other long term (current) drug therapy: Secondary | ICD-10-CM | POA: Insufficient documentation

## 2018-06-05 DIAGNOSIS — I10 Essential (primary) hypertension: Secondary | ICD-10-CM | POA: Insufficient documentation

## 2018-06-05 DIAGNOSIS — F419 Anxiety disorder, unspecified: Secondary | ICD-10-CM | POA: Insufficient documentation

## 2018-06-05 DIAGNOSIS — F17210 Nicotine dependence, cigarettes, uncomplicated: Secondary | ICD-10-CM | POA: Insufficient documentation

## 2018-06-05 DIAGNOSIS — E7800 Pure hypercholesterolemia, unspecified: Secondary | ICD-10-CM | POA: Insufficient documentation

## 2018-06-05 LAB — CBC
HEMATOCRIT: 47 % — AB (ref 36.0–46.0)
Hemoglobin: 15.1 g/dL — ABNORMAL HIGH (ref 12.0–15.0)
MCH: 31.8 pg (ref 26.0–34.0)
MCHC: 32.1 g/dL (ref 30.0–36.0)
MCV: 98.9 fL (ref 80.0–100.0)
Platelets: 357 10*3/uL (ref 150–400)
RBC: 4.75 MIL/uL (ref 3.87–5.11)
RDW: 13.2 % (ref 11.5–15.5)
WBC: 8 10*3/uL (ref 4.0–10.5)
nRBC: 0 % (ref 0.0–0.2)

## 2018-06-05 LAB — BASIC METABOLIC PANEL
Anion gap: 10 (ref 5–15)
BUN: 10 mg/dL (ref 6–20)
CHLORIDE: 102 mmol/L (ref 98–111)
CO2: 27 mmol/L (ref 22–32)
CREATININE: 0.72 mg/dL (ref 0.44–1.00)
Calcium: 9.4 mg/dL (ref 8.9–10.3)
GFR calc Af Amer: >60 mL/min (ref >60)
GFR calc non Af Amer: >60 mL/min (ref >60)
Glucose, Bld: 109 mg/dL — ABNORMAL HIGH (ref 70–99)
Potassium: 3.4 mmol/L — ABNORMAL LOW (ref 3.5–5.1)
Sodium: 139 mmol/L (ref 135–145)

## 2018-06-05 LAB — TROPONIN I: Troponin I: <0.03 ng/mL (ref <0.03)

## 2018-06-05 MED ORDER — LORAZEPAM 2 MG/ML IJ SOLN
1.0000 mg | Freq: Once | INTRAMUSCULAR | Status: AC
  Administered 2018-06-05: 1 mg via INTRAMUSCULAR
  Filled 2018-06-05: qty 1

## 2018-06-05 NOTE — ED Provider Notes (Signed)
[facility], [place] EMERGENCY DEPARTMENT Provider Note   CSN: [id number] Arrival date & time: 06/05/18  1746     History   Chief Complaint Chief Complaint  Patient presents with  . Chest Pain    HPI Sherry [name] is a 47 y.o. female with a history of hypertension, hypercholesterolemia and bipolar disorder with anxiety and panic attacks presenting with a 4-day history of chest pressure with shortness of breath.  She describes the sensation radiates into her left axilla and also into her upper back and across her bilateral shoulders.  She reports previous similar symptoms with anxiety and panic episodes.  She states she has been increasingly anxious since running out of her Klonopin 4 days ago.  Additionally she ran out of her Zoloft yesterday and the Seroquel which she takes for sleep 3 days ago.  She just received a letter from [facility] where her behavioral health provider is, stating that she will be out of the office until the end of January.  Patient is very upset and concerned about been able to get her medicines refilled.  She has had very little sleep in the past 3 days.  She denies homicidal or suicidal ideation.  Her chest pressure has been constant, not worsening with exertion or positional changes.  She has found no alleviators for her symptoms.  The history is provided by the patient and the spouse.    Past Medical History:  Diagnosis Date  . Depression   . High cholesterol   . Hypertension   . Lung collapse    left    There are no active problems to display for this patient.   Past Surgical History:  Procedure Laterality Date  . CHEST TUBE INSERTION    . ENDOMETRIAL ABLATION       OB History   None      Home Medications    Prior to Admission medications   Medication Sig Start Date End Date Taking? Authorizing Provider  acetaminophen (TYLENOL) 650 MG CR tablet Take 650 mg by mouth every 8 (eight) hours as needed for pain.    Yes [provider]  hydrochlorothiazide (HYDRODIURIL) 25 MG tablet Take 25 mg by mouth every morning.  02/11/16  Yes [provider]  ibuprofen (ADVIL,MOTRIN) 600 MG tablet Take 1 tablet (600 mg total) by mouth every 6 (six) hours as needed. 02/26/18  Yes Isla Pence, MD  Magnesium 250 MG TABS Take 250 mg by mouth daily as needed (for cramping).    Yes [provider]  metoprolol succinate (TOPROL-XL) 50 MG 24 hr tablet Take 50 mg by mouth every morning. Take with or immediately following a meal.   Yes [provider]  pantoprazole (PROTONIX) 40 MG tablet Take 40 mg by mouth every morning.  12/09/15  Yes [provider]  rosuvastatin (CRESTOR) 40 MG tablet Take 40 mg by mouth every morning.  02/10/16  Yes [provider]  clonazePAM (KLONOPIN) 0.5 MG tablet Take 1 tablet (0.5 mg total) by mouth 2 (two) times daily as needed for up to 10 days for anxiety. 06/06/18 06/16/18  Evalee Jefferson, PA-C  HYDROcodone-acetaminophen (NORCO/VICODIN) 5-325 MG tablet Take 1 tablet by mouth every 4 (four) hours as needed. Patient not taking: Reported on 03/06/2018 02/26/18   Isla Pence, MD  QUEtiapine (SEROQUEL) 400 MG tablet Take 1 tablet (400 mg total) by mouth at bedtime. 06/06/18   Evalee Jefferson, PA-C  sertraline (ZOLOFT) 100 MG tablet Take 0.5 tablets (50 mg total)  by mouth daily. 06/06/18   Evalee Jefferson, PA-C    Family History No family history on file.  Social History Social History   Tobacco Use  . Smoking status: Current Every Day Smoker    Packs/day: 0.50    Types: Cigarettes  . Smokeless tobacco: Never Used  Substance Use Topics  . Alcohol use: Yes    Comment: occasionally   . Drug use: Never     Allergies   Dimenhydrinate   Review of Systems Review of Systems  Constitutional: Positive for appetite change. Negative for fever.  HENT: Negative for congestion and sore throat.   Eyes: Negative.  Negative for photophobia and visual disturbance.  Respiratory:  Positive for shortness of breath. Negative for chest tightness.   Cardiovascular: Positive for chest pain.  Gastrointestinal: Negative for abdominal pain, nausea and vomiting.  Genitourinary: Negative.   Musculoskeletal: Negative for arthralgias, joint swelling and neck pain.  Skin: Negative.  Negative for rash and wound.  Neurological: Negative for dizziness, weakness, light-headedness, numbness and headaches.  Psychiatric/Behavioral: Positive for sleep disturbance. Negative for agitation, hallucinations and suicidal ideas. The patient is nervous/anxious.      Physical Exam Updated Vital Signs BP 111/71   Pulse 68   Temp 98 F (36.7 C) (Oral)   Resp 13   Ht 5' 5.5" (1.664 m)   Wt 65.8 kg   SpO2 98%   BMI 23.76 kg/m   Physical Exam  Constitutional: She appears well-developed and well-nourished.  Patient quite anxious and tearful upon first evaluation.  HENT:  Head: Normocephalic and atraumatic.  Eyes: Conjunctivae are normal.  Neck: Normal range of motion.  Cardiovascular: Normal rate, regular rhythm, normal heart sounds and intact distal pulses.  Pulmonary/Chest: Effort normal and breath sounds normal. No respiratory distress. She has no decreased breath sounds. She has no wheezes. She has no rhonchi. She has no rales. She exhibits tenderness.  Reproducible chest pain at left axilla    Abdominal: Soft. Bowel sounds are normal. There is no tenderness.  Musculoskeletal: Normal range of motion.  Neurological: She is alert.  Skin: Skin is warm and dry.  Psychiatric: She has a normal mood and affect.  Nursing note and vitals reviewed.    ED Treatments / Results  Labs (all labs ordered are listed, but only abnormal results are displayed) Labs Reviewed  BASIC METABOLIC PANEL - Abnormal; Notable for the following components:      Result Value   Potassium 3.4 (*)    Glucose, Bld 109 (*)    All other components within normal limits  CBC - Abnormal; Notable for the  following components:   Hemoglobin 15.1 (*)    HCT 47.0 (*)    All other components within normal limits  TROPONIN I    EKG EKG Interpretation  Date/Time:  Monday June 05 2018 18:11:25 EST Ventricular Rate:  82 PR Interval:  150 QRS Duration: 76 QT Interval:  372 QTC Calculation: 434 R Axis:   106 Text Interpretation:  Normal sinus rhythm Possible Left atrial enlargement Rightward axis ST & T wave abnormality, consider anterior ischemia Artifact When compared with ECG of 02/26/2018 No significant change was found Confirmed by Francine Graven [phone]) on 06/05/2018 10:51:59 PM  ED ECG REPORT   Date: 06/05/2018 23:08  Repeated due to artifact  Rate: 66  Rhythm: normal sinus rhythm  QRS Axis: right  Intervals: normal  ST/T Wave abnormalities: nonspecific ST changes  Conduction Disutrbances:none  Narrative Interpretation:   Old EKG Reviewed: unchanged  I have personally reviewed the EKG tracing and agree with the computerized printout as noted.   Radiology Dg Chest 2 View  Result Date: 06/05/2018 CLINICAL DATA:  Chest pain and shortness of breath for 2 days, unable to sleep, history hypertension, smoker EXAM: CHEST - 2 VIEW COMPARISON:  03/06/2018 FINDINGS: Normal heart size, mediastinal contours, and pulmonary vascularity. Lungs emphysematous with minimal biapical scarring. No acute infiltrate, pleural effusion or pneumothorax. Osseous structures unremarkable. IMPRESSION: COPD changes without infiltrate. Electronically Signed   By: Lavonia Dana M.D.   On: 06/05/2018 18:31    Procedures Procedures (including critical care time)  Medications Ordered in ED Medications  LORazepam (ATIVAN) tablet 1 mg (has no administration in time range)  LORazepam (ATIVAN) injection 1 mg (1 mg Intramuscular Given 06/05/18 2246)     Initial Impression / Assessment and Plan / ED Course  I have reviewed the triage vital signs and the nursing notes.  Pertinent labs & imaging results  that were available during my care of the patient were reviewed by me and considered in my medical decision making (see chart for details).     Pt with anxiety and panic with insomnia since being out of several of her medications including klonopin, sertraline and seroquel.  She has reproducible chest pain with no ekg changes and a negative troponin, sx lasting 3 days duration and associates this with other episodes of anxiety.  No evidence for cardiac source of sx, especially with reproducible symptoms.  She was given an IM dose of Ativan and her anxiety and symptoms were greatly reduced.  We discussed options for obtaining her medication refills, namely that these will need to come from her primary physician while she is awaiting the return of her psychiatrist.  She was given a refill for 10 days of medication given her adequate time to contact her PCP for further assistance with these medications.  The [place] narcotic database was reviewed and is consistent with patient stated story.  She also presented here with her pill bottles to confirm her medication dosages.  The patient appears reasonably screened and/or stabilized for discharge and I doubt any other medical condition or other [facility] requiring further screening, evaluation, or treatment in the ED at this time prior to discharge.   Final Clinical Impressions(s) / ED Diagnoses   Final diagnoses:  Anxiety  Chest wall pain  Encounter for medication refill    ED Discharge Orders         Ordered    clonazePAM (KLONOPIN) 0.5 MG tablet  2 times daily PRN     06/06/18 0044    sertraline (ZOLOFT) 100 MG tablet  Daily     06/06/18 0044    QUEtiapine (SEROQUEL) 400 MG tablet  Daily at bedtime     06/06/18 0044           Evalee Jefferson, PA-C 06/06/18 0125    Francine Graven, DO 06/08/18 1846

## 2018-06-05 NOTE — ED Notes (Signed)
ED Provider at bedside. 

## 2018-06-05 NOTE — ED Triage Notes (Signed)
Pt c/o LT sided CP that radiates to LT axilla with SOB and lightheadedness. Also endorses pain to bilateral shoulders and lower back. Pt states she ran out of Klonopin and thinks this may be part of her problem.

## 2018-06-05 NOTE — ED Notes (Signed)
Gave EKG to Dr.Mcmanus 

## 2018-06-05 NOTE — ED Notes (Signed)
Pt reports her Psychiatrist is out of office until January and ran out of klonopin.

## 2018-06-06 MED ORDER — SERTRALINE HCL 100 MG PO TABS: 50.0000 mg | ORAL_TABLET | Freq: Every day | ORAL | 0 refills | Status: DC

## 2018-06-06 MED ORDER — LORAZEPAM 1 MG PO TABS
1.0000 mg | ORAL_TABLET | Freq: Once | ORAL | Status: AC
  Administered 2018-06-06: 1 mg via ORAL
  Filled 2018-06-06: qty 1

## 2018-06-06 MED ORDER — CLONAZEPAM 0.5 MG PO TABS: 0.5000 mg | ORAL_TABLET | Freq: Two times a day (BID) | ORAL | 0 refills | Status: DC | PRN

## 2018-06-06 MED ORDER — QUETIAPINE FUMARATE 400 MG PO TABS: 400.0000 mg | ORAL_TABLET | Freq: Every day | ORAL | 0 refills | Status: AC

## 2018-06-06 NOTE — Discharge Instructions (Addendum)
You have been prescribed medication for the next 10 days for your depression and anxiety.  However, your primary doctor should be the one assisting you with your refills until Dr Greggory Brandy has returned to the office.  You have received a sedating medication here, do not drive yourself home tonight.  See the referrals for other options for mental health care.

## 2018-06-20 ENCOUNTER — Emergency Department (HOSPITAL_COMMUNITY)
Admission: EM | Admit: 2018-06-20 | Discharge: 2018-06-21 | Disposition: A | Discharge status: Home / Self Care | Payer: Self-pay | Attending: Emergency Medicine | Admitting: Emergency Medicine

## 2018-06-20 DIAGNOSIS — Z79899 Other long term (current) drug therapy: Secondary | ICD-10-CM | POA: Insufficient documentation

## 2018-06-20 DIAGNOSIS — I10 Essential (primary) hypertension: Secondary | ICD-10-CM | POA: Insufficient documentation

## 2018-06-20 DIAGNOSIS — R42 Dizziness and giddiness: Secondary | ICD-10-CM | POA: Insufficient documentation

## 2018-06-20 DIAGNOSIS — F17210 Nicotine dependence, cigarettes, uncomplicated: Secondary | ICD-10-CM | POA: Insufficient documentation

## 2018-06-20 NOTE — ED Triage Notes (Addendum)
Pt comes in with multiple complaints. C/o dizziness that started yesterday and then nausea all day and emesis x1 in waiting room. Also said her back is hurting.

## 2018-06-21 ENCOUNTER — Other Ambulatory Visit: Payer: Self-pay

## 2018-06-21 ENCOUNTER — Emergency Department (HOSPITAL_COMMUNITY): Payer: Self-pay

## 2018-06-21 ENCOUNTER — Encounter (HOSPITAL_COMMUNITY): Payer: Self-pay

## 2018-06-21 LAB — URINALYSIS, ROUTINE W REFLEX MICROSCOPIC
Bacteria, UA: NONE SEEN
Bilirubin Urine: NEGATIVE
GLUCOSE, UA: NEGATIVE mg/dL
KETONES UR: NEGATIVE mg/dL
Leukocytes, UA: NEGATIVE
NITRITE: NEGATIVE
PH: 6 (ref 5.0–8.0)
Protein, ur: NEGATIVE mg/dL
SPECIFIC GRAVITY, URINE: 1.013 (ref 1.005–1.030)

## 2018-06-21 LAB — COMPREHENSIVE METABOLIC PANEL
ALT: 17 U/L (ref 0–44)
AST: 20 U/L (ref 15–41)
Albumin: 3.7 g/dL (ref 3.5–5.0)
Alkaline Phosphatase: 56 U/L (ref 38–126)
Anion gap: 7 (ref 5–15)
BUN: 14 mg/dL (ref 6–20)
CO2: 28 mmol/L (ref 22–32)
CREATININE: 0.8 mg/dL (ref 0.44–1.00)
Calcium: 9 mg/dL (ref 8.9–10.3)
Chloride: 101 mmol/L (ref 98–111)
GFR calc non Af Amer: >60 mL/min (ref >60)
Glucose, Bld: 117 mg/dL — ABNORMAL HIGH (ref 70–99)
Potassium: 3.7 mmol/L (ref 3.5–5.1)
Sodium: 136 mmol/L (ref 135–145)
TOTAL PROTEIN: 6.4 g/dL — AB (ref 6.5–8.1)
Total Bilirubin: 0.6 mg/dL (ref 0.3–1.2)

## 2018-06-21 LAB — CBC WITH DIFFERENTIAL/PLATELET
Abs Immature Granulocytes: 0.02 10*3/uL (ref 0.00–0.07)
BASOS ABS: 0.1 10*3/uL (ref 0.0–0.1)
Basophils Relative: 1 %
Eosinophils Absolute: 0.2 10*3/uL (ref 0.0–0.5)
Eosinophils Relative: 3 %
HEMATOCRIT: 45.9 % (ref 36.0–46.0)
Hemoglobin: 14.9 g/dL (ref 12.0–15.0)
Immature Granulocytes: 0 %
Lymphocytes Relative: 37 %
Lymphs Abs: 2.7 10*3/uL (ref 0.7–4.0)
MCH: 32.1 pg (ref 26.0–34.0)
MCHC: 32.5 g/dL (ref 30.0–36.0)
MCV: 98.9 fL (ref 80.0–100.0)
Monocytes Absolute: 0.5 10*3/uL (ref 0.1–1.0)
Monocytes Relative: 7 %
NEUTROS PCT: 52 %
Neutro Abs: 3.8 10*3/uL (ref 1.7–7.7)
Platelets: 326 10*3/uL (ref 150–400)
RBC: 4.64 MIL/uL (ref 3.87–5.11)
RDW: 13.2 % (ref 11.5–15.5)
WBC: 7.4 10*3/uL (ref 4.0–10.5)
nRBC: 0 % (ref 0.0–0.2)

## 2018-06-21 LAB — TROPONIN I: Troponin I: <0.03 ng/mL (ref <0.03)

## 2018-06-21 LAB — D-DIMER, QUANTITATIVE: D-Dimer, Quant: <0.27 ug/mL-FEU (ref 0.00–0.50)

## 2018-06-21 LAB — PREGNANCY, URINE: Preg Test, Ur: NEGATIVE

## 2018-06-21 MED ORDER — ONDANSETRON 4 MG PO TBDP: 4.0000 mg | ORAL_TABLET | Freq: Three times a day (TID) | ORAL | 0 refills | Status: DC | PRN

## 2018-06-21 MED ORDER — KETOROLAC TROMETHAMINE 30 MG/ML IJ SOLN
30.0000 mg | Freq: Once | INTRAMUSCULAR | Status: AC
  Administered 2018-06-21: 30 mg via INTRAVENOUS
  Filled 2018-06-21: qty 1

## 2018-06-21 MED ORDER — SODIUM CHLORIDE 0.9 % IV BOLUS
1000.0000 mL | Freq: Once | INTRAVENOUS | Status: AC
  Administered 2018-06-21: 1000 mL via INTRAVENOUS

## 2018-06-21 MED ORDER — ONDANSETRON HCL 4 MG/2ML IJ SOLN
4.0000 mg | Freq: Once | INTRAMUSCULAR | Status: AC
  Administered 2018-06-21: 4 mg via INTRAVENOUS
  Filled 2018-06-21: qty 2

## 2018-06-21 MED ORDER — MECLIZINE HCL 12.5 MG PO TABS
25.0000 mg | ORAL_TABLET | Freq: Once | ORAL | Status: AC
  Administered 2018-06-21: 25 mg via ORAL
  Filled 2018-06-21: qty 2

## 2018-06-21 MED ORDER — MECLIZINE HCL 25 MG PO TABS: 25.0000 mg | ORAL_TABLET | Freq: Three times a day (TID) | ORAL | 0 refills | Status: DC | PRN

## 2018-06-21 NOTE — ED Notes (Signed)
Pt states nausea and dizziness has went away however now complains of a headache 5/10 edp notified

## 2018-06-21 NOTE — Discharge Instructions (Addendum)
Your testing is reassuring.  Take the meclizine as needed for dizziness.  Do not take it if you are working or driving as it can make you sleepy.  Do not take it with your Klonopin.  Follow-up with your doctor.  Return to the ED if develop new or worsening symptoms.

## 2018-06-21 NOTE — ED Provider Notes (Signed)
[facility] EMERGENCY DEPARTMENT Provider Note   CSN: [id number] Arrival date & time: 06/20/18  2347     History   Chief Complaint Chief Complaint  Patient presents with  . Dizziness  . Nausea    HPI Sherry [name] is a 47 y.o. female.  Patient presents with "dizziness" for the past 2 days that is intermittent.  She has a difficult time describing the dizziness but states it is a lightheaded feeling and sometimes a spinning feeling as well.  It lasts for several seconds to minutes at a time and occurs frequently.  It is somewhat worse when she changes positions.  She has had nausea all day and vomited one time today upon arrival to the ED.  She denies any focal weakness, numbness or tingling.  She denies any illicit drug or alcohol use.  She does take chronic benzodiazepines for bipolar disorder.  States she has not had this dizziness in the past.  Also endorses diffuse gradual onset headache associated with nausea.  No fevers or chills.  Complains of left upper chest and axillary pain which is a chronic problem for her but states this is now worse with the ongoing dizziness.  Pain is worse with palpation.  Does have some shortness of breath and pain with breathing.  Denies any vision change.  The history is provided by the patient.  Dizziness  Associated symptoms: chest pain, headaches, nausea, vomiting and weakness   Associated symptoms: no shortness of breath     Past Medical History:  Diagnosis Date  . Depression   . High cholesterol   . Hypertension   . Lung collapse    left    There are no active problems to display for this patient.   Past Surgical History:  Procedure Laterality Date  . CHEST TUBE INSERTION    . ENDOMETRIAL ABLATION       OB History   None      Home Medications    Prior to Admission medications   Medication Sig Start Date End Date Taking? Authorizing Provider  acetaminophen (TYLENOL) 650 MG CR tablet Take 650 mg by mouth every 8 (eight)  hours as needed for pain.     [provider]  clonazePAM (KLONOPIN) 0.5 MG tablet Take 1 tablet (0.5 mg total) by mouth 2 (two) times daily as needed for up to 10 days for anxiety. 06/06/18 06/16/18  Evalee Jefferson, PA-C  hydrochlorothiazide (HYDRODIURIL) 25 MG tablet Take 25 mg by mouth every morning.  02/11/16   [provider]  HYDROcodone-acetaminophen (NORCO/VICODIN) 5-325 MG tablet Take 1 tablet by mouth every 4 (four) hours as needed. Patient not taking: Reported on 03/06/2018 02/26/18   Isla Pence, MD  ibuprofen (ADVIL,MOTRIN) 600 MG tablet Take 1 tablet (600 mg total) by mouth every 6 (six) hours as needed. 02/26/18   Isla Pence, MD  Magnesium 250 MG TABS Take 250 mg by mouth daily as needed (for cramping).     [provider]  metoprolol succinate (TOPROL-XL) 50 MG 24 hr tablet Take 50 mg by mouth every morning. Take with or immediately following a meal.    [provider]  pantoprazole (PROTONIX) 40 MG tablet Take 40 mg by mouth every morning.  12/09/15   [provider]  QUEtiapine (SEROQUEL) 400 MG tablet Take 1 tablet (400 mg total) by mouth at bedtime. 06/06/18   Evalee Jefferson, PA-C  rosuvastatin (CRESTOR) 40 MG tablet Take 40 mg by mouth every morning.  02/10/16  [provider]  sertraline (ZOLOFT) 100 MG tablet Take 0.5 tablets (50 mg total) by mouth daily. 06/06/18   Evalee Jefferson, PA-C    Family History History reviewed. No pertinent family history.  Social History Social History   Tobacco Use  . Smoking status: Current Every Day Smoker    Packs/day: 0.50    Types: Cigarettes  . Smokeless tobacco: Never Used  Substance Use Topics  . Alcohol use: Yes    Comment: occasionally   . Drug use: Never     Allergies   Dimenhydrinate   Review of Systems Review of Systems  Constitutional: Negative for activity change, appetite change and fever.  HENT: Negative for congestion, postnasal drip and rhinorrhea.   Eyes:  Negative for visual disturbance.  Respiratory: Negative for cough, chest tightness and shortness of breath.   Cardiovascular: Positive for chest pain.  Gastrointestinal: Positive for nausea and vomiting. Negative for abdominal pain.  Genitourinary: Negative for dysuria, flank pain, hematuria, vaginal bleeding and vaginal discharge.  Musculoskeletal: Positive for arthralgias and myalgias.  Skin: Negative for rash.  Neurological: Positive for dizziness, weakness, light-headedness and headaches.   all other systems are negative except as noted in the HPI and PMH.     Physical Exam Updated Vital Signs BP 111/78   Pulse 78   Temp 97.8 F (36.6 C) (Oral)   Resp 18   Ht 5\' 5"  (1.651 m)   Wt 65.8 kg   SpO2 100%   BMI 24.13 kg/m   Physical Exam  Constitutional: She is oriented to person, place, and time. She appears well-developed and well-nourished. No distress.  HENT:  Head: Normocephalic and atraumatic.  Mouth/Throat: Oropharynx is clear and moist. No oropharyngeal exudate.  Horizontal nystagmus on the right that is fatigable  Dry cerumen bilaterally  Eyes: Pupils are equal, round, and reactive to light. Conjunctivae and EOM are normal.  Neck: Normal range of motion. Neck supple.  No meningismus.  Cardiovascular: Normal rate, regular rhythm, normal heart sounds and intact distal pulses.  No murmur heard. Pulmonary/Chest: Effort normal and breath sounds normal. No respiratory distress. She exhibits no tenderness.  Abdominal: Soft. There is no tenderness. There is no rebound and no guarding.  Musculoskeletal: Normal range of motion. She exhibits no edema or tenderness.  Neurological: She is alert and oriented to person, place, and time. No cranial nerve deficit. She exhibits normal muscle tone. Coordination normal.  CN 2-12 intact, no ataxia on finger to nose, fatigable nystagmus on right 5/5 strength throughout, no pronator drift, Romberg negative, normal gait.   Skin: Skin is  warm. Capillary refill takes less than 2 seconds.  Psychiatric: She has a normal mood and affect. Her behavior is normal.  Nursing note and vitals reviewed.    ED Treatments / Results  Labs (all labs ordered are listed, but only abnormal results are displayed) Labs Reviewed  URINALYSIS, ROUTINE W REFLEX MICROSCOPIC - Abnormal; Notable for the following components:      Result Value   Hgb urine dipstick SMALL (*)    All other components within normal limits  COMPREHENSIVE METABOLIC PANEL - Abnormal; Notable for the following components:   Glucose, Bld 117 (*)    Total Protein 6.4 (*)    All other components within normal limits  PREGNANCY, URINE  CBC WITH DIFFERENTIAL/PLATELET  TROPONIN I  D-DIMER, QUANTITATIVE (NOT AT [facility])    EKG EKG Interpretation  Date/Time:  Wednesday June 21 2018 00:06:46 EST Ventricular Rate:  76 PR Interval:  QRS Duration: 90 QT Interval:  386 QTC Calculation: 434 R Axis:   85 Text Interpretation:  Sinus rhythm Nonspecific T abnormalities, anterior leads No significant change was found Confirmed by Ezequiel Essex [phone]) on 06/21/2018 12:36:30 AM   Radiology Dg Chest 2 View  Result Date: 06/21/2018 CLINICAL DATA:  Dizziness, nausea, vomiting and back pain since yesterday. Smoker. EXAM: CHEST - 2 VIEW COMPARISON:  06/05/2018. FINDINGS: Normal sized heart. Clear lungs with stable mild prominence of the interstitial markings, mild hyperexpansion of the lungs and mild biapical pleural and parenchymal scarring. Mild thoracic spine degenerative changes. IMPRESSION: No acute abnormality. Stable mild changes of COPD. Electronically Signed   By: Claudie Revering M.D.   On: 06/21/2018 01:30   Ct Head Wo Contrast  Result Date: 06/21/2018 CLINICAL DATA:  Dizziness since yesterday. Nausea today with vomiting. EXAM: CT HEAD WITHOUT CONTRAST TECHNIQUE: Contiguous axial images were obtained from the base of the skull through the vertex without intravenous  contrast. COMPARISON:  None. FINDINGS: Brain: Normal appearing cerebral hemispheres and posterior fossa structures. Normal size and position of the ventricles. No intracranial hemorrhage, mass lesion or CT evidence of acute infarction. Vascular: No hyperdense vessel or unexpected calcification. Skull: Normal. Negative for fracture or focal lesion. Sinuses/Orbits: Unremarkable. Other: None. IMPRESSION: Normal examination. Electronically Signed   By: Claudie Revering M.D.   On: 06/21/2018 01:28    Procedures Procedures (including critical care time)  Medications Ordered in ED Medications  sodium chloride 0.9 % bolus 1,000 mL (has no administration in time range)  ondansetron (ZOFRAN) injection 4 mg (has no administration in time range)  meclizine (ANTIVERT) tablet 25 mg (has no administration in time range)     Initial Impression / Assessment and Plan / ED Course  I have reviewed the triage vital signs and the nursing notes.  Pertinent labs & imaging results that were available during my care of the patient were reviewed by me and considered in my medical decision making (see chart for details).    2 days of dizziness with nausea.  No neurological deficits.  Does have horizontal nystagmus on exam. Describes mostly lightheadedness with intermittent possible vertigo as well.   IVF given. EKG unchanged. Orthostatics positive by heart rate. HCG negative.  Labs reassuring.  Troponin and d-dimer negative.  Chest x-ray negative.  Chest pain has been ongoing for several weeks.  Low suspicion for ACS, PE, or dissection.  CT head is negative.  Dizziness has resolved after meclizine and Zofran.  She is tolerating p.o. and ambulatory.  Suspect peripheral vertigo.  She does have fatigable horizontal nystagmus.  No ataxia.  She is able to ambulate without assistance.  Low suspicion for central cause of vertigo.  Patient tolerating p.o. and ambulatory.  She will be given meclizine and Zofran for home use.   Cautioned not to take this if she is working or driving and did not take it with her Klonopin. Follow-up with PCP.  Return precautions discussed.  Final Clinical Impressions(s) / ED Diagnoses   Final diagnoses:  Vertigo    ED Discharge Orders    None       Jhordyn Hoopingarner, Annie Main, MD 06/21/18 [phone]

## 2018-06-21 NOTE — ED Notes (Signed)
Pt ambulated to restroom without assistance.

## 2018-06-22 NOTE — ED Notes (Signed)
Pt called requesting something cheaper than zofran for nausea.  Dr. Laverta Baltimore prescribed phenergan 25mg  tabs po every 6 hours prn n/v dispense 15 and no refills.  Prescription called in to [facility] in [place].

## 2018-08-02 ENCOUNTER — Emergency Department (HOSPITAL_COMMUNITY): Admission: EM | Admit: 2018-08-02 | Discharge: 2018-08-02 | Discharge status: Against Medical Advice | Payer: Self-pay

## 2018-08-02 DIAGNOSIS — F329 Major depressive disorder, single episode, unspecified: Secondary | ICD-10-CM | POA: Insufficient documentation

## 2018-08-02 DIAGNOSIS — M5412 Radiculopathy, cervical region: Secondary | ICD-10-CM | POA: Insufficient documentation

## 2018-08-02 DIAGNOSIS — F17210 Nicotine dependence, cigarettes, uncomplicated: Secondary | ICD-10-CM | POA: Insufficient documentation

## 2018-08-02 DIAGNOSIS — R42 Dizziness and giddiness: Secondary | ICD-10-CM | POA: Insufficient documentation

## 2018-08-02 DIAGNOSIS — I10 Essential (primary) hypertension: Secondary | ICD-10-CM | POA: Insufficient documentation

## 2018-08-02 DIAGNOSIS — Z79899 Other long term (current) drug therapy: Secondary | ICD-10-CM | POA: Insufficient documentation

## 2018-08-02 NOTE — ED Triage Notes (Signed)
As RN was going to call for pt to be seen in triage, registration notified RN that pt had just left waiting area prior to being seen.

## 2018-08-03 ENCOUNTER — Other Ambulatory Visit: Payer: Self-pay

## 2018-08-03 ENCOUNTER — Emergency Department (HOSPITAL_COMMUNITY)
Admission: EM | Admit: 2018-08-03 | Discharge: 2018-08-03 | Disposition: A | Discharge status: Home / Self Care | Payer: Self-pay | Attending: Emergency Medicine | Admitting: Emergency Medicine

## 2018-08-03 ENCOUNTER — Emergency Department (HOSPITAL_COMMUNITY): Payer: Self-pay

## 2018-08-03 ENCOUNTER — Encounter (HOSPITAL_COMMUNITY): Payer: Self-pay | Admitting: *Deleted

## 2018-08-03 DIAGNOSIS — M5412 Radiculopathy, cervical region: Secondary | ICD-10-CM

## 2018-08-03 DIAGNOSIS — R42 Dizziness and giddiness: Secondary | ICD-10-CM

## 2018-08-03 LAB — CBC WITH DIFFERENTIAL/PLATELET
ABS IMMATURE GRANULOCYTES: 0.02 10*3/uL (ref 0.00–0.07)
Basophils Absolute: 0.1 10*3/uL (ref 0.0–0.1)
Basophils Relative: 1 %
Eosinophils Absolute: 0.1 10*3/uL (ref 0.0–0.5)
Eosinophils Relative: 2 %
HCT: 38.9 % (ref 36.0–46.0)
Hemoglobin: 12.8 g/dL (ref 12.0–15.0)
Immature Granulocytes: 0 %
Lymphocytes Relative: 34 %
Lymphs Abs: 2.4 10*3/uL (ref 0.7–4.0)
MCH: 32.7 pg (ref 26.0–34.0)
MCHC: 32.9 g/dL (ref 30.0–36.0)
MCV: 99.5 fL (ref 80.0–100.0)
Monocytes Absolute: 0.5 10*3/uL (ref 0.1–1.0)
Monocytes Relative: 7 %
Neutro Abs: 4 10*3/uL (ref 1.7–7.7)
Neutrophils Relative %: 56 %
Platelets: 406 10*3/uL — ABNORMAL HIGH (ref 150–400)
RBC: 3.91 MIL/uL (ref 3.87–5.11)
RDW: 14.6 % (ref 11.5–15.5)
WBC: 7 10*3/uL (ref 4.0–10.5)
nRBC: 0 % (ref 0.0–0.2)

## 2018-08-03 LAB — BASIC METABOLIC PANEL
Anion gap: 8 (ref 5–15)
BUN: 12 mg/dL (ref 6–20)
CO2: 25 mmol/L (ref 22–32)
Calcium: 8.8 mg/dL — ABNORMAL LOW (ref 8.9–10.3)
Chloride: 105 mmol/L (ref 98–111)
Creatinine, Ser: 0.73 mg/dL (ref 0.44–1.00)
GFR calc Af Amer: >60 mL/min (ref >60)
GFR calc non Af Amer: >60 mL/min (ref >60)
Glucose, Bld: 104 mg/dL — ABNORMAL HIGH (ref 70–99)
POTASSIUM: 3.8 mmol/L (ref 3.5–5.1)
Sodium: 138 mmol/L (ref 135–145)

## 2018-08-03 LAB — TROPONIN I: Troponin I: <0.03 ng/mL (ref <0.03)

## 2018-08-03 MED ORDER — TRAMADOL HCL 50 MG PO TABS: 50.0000 mg | ORAL_TABLET | Freq: Four times a day (QID) | ORAL | 0 refills | Status: DC | PRN

## 2018-08-03 MED ORDER — METHOCARBAMOL 500 MG PO TABS: 500.0000 mg | ORAL_TABLET | Freq: Three times a day (TID) | ORAL | 0 refills | Status: DC | PRN

## 2018-08-03 MED ORDER — PREDNISONE 20 MG PO TABS: ORAL_TABLET | ORAL | 0 refills | Status: DC

## 2018-08-03 MED ORDER — MECLIZINE HCL 12.5 MG PO TABS
25.0000 mg | ORAL_TABLET | Freq: Once | ORAL | Status: AC
  Administered 2018-08-03: 25 mg via ORAL
  Filled 2018-08-03: qty 2

## 2018-08-03 MED ORDER — KETOROLAC TROMETHAMINE 30 MG/ML IJ SOLN
30.0000 mg | Freq: Once | INTRAMUSCULAR | Status: AC
  Administered 2018-08-03: 30 mg via INTRAVENOUS
  Filled 2018-08-03: qty 1

## 2018-08-03 MED ORDER — OXYCODONE-ACETAMINOPHEN 5-325 MG PO TABS
1.0000 | ORAL_TABLET | Freq: Once | ORAL | Status: AC
  Administered 2018-08-03: 1 via ORAL
  Filled 2018-08-03: qty 1

## 2018-08-03 MED ORDER — MECLIZINE HCL 25 MG PO TABS: 25.0000 mg | ORAL_TABLET | Freq: Three times a day (TID) | ORAL | 0 refills | Status: DC | PRN

## 2018-08-03 MED ORDER — METOCLOPRAMIDE HCL 5 MG/ML IJ SOLN
10.0000 mg | Freq: Once | INTRAMUSCULAR | Status: AC
  Administered 2018-08-03: 10 mg via INTRAVENOUS
  Filled 2018-08-03: qty 2

## 2018-08-03 MED ORDER — SODIUM CHLORIDE 0.9 % IV BOLUS
1000.0000 mL | Freq: Once | INTRAVENOUS | Status: AC
  Administered 2018-08-03: 1000 mL via INTRAVENOUS

## 2018-08-03 MED ORDER — DEXAMETHASONE SODIUM PHOSPHATE 10 MG/ML IJ SOLN
10.0000 mg | Freq: Once | INTRAMUSCULAR | Status: AC
  Administered 2018-08-03: 10 mg via INTRAVENOUS
  Filled 2018-08-03: qty 1

## 2018-08-03 NOTE — ED Triage Notes (Signed)
Pt c/o right shoulder pain and dizziness and states it feels like "something is crawling in my right ear"; pt also c/o bilateral lower back pain

## 2018-08-03 NOTE — ED Provider Notes (Signed)
[facility] EMERGENCY DEPARTMENT Provider Note   CSN: [id number] Arrival date & time: 08/02/18  2235     History   Chief Complaint Chief Complaint  Patient presents with  . Shoulder Pain    HPI Sherry [name] is a 48 y.o. female.  Patient presents to the emergency department with multiple complaints.  Patient reports that she has been experiencing headaches with pain on the right side of her neck.  Pain is now going down her right arm and into the right shoulder blade area.  She describes this as a burning type sensation.  Pain worsens with movement of the neck and arm.  She denies injury.  Patient concerned because she has a family history of heart disease.  Patient reports that the headache is been present for several days and she experiences dizziness with it.  She feels like she is spinning.  She has history of vertigo.  This feels similar.  She reports recent brain scan, would not want to have another one.     Past Medical History:  Diagnosis Date  . Depression   . High cholesterol   . Hypertension   . Lung collapse    left    There are no active problems to display for this patient.   Past Surgical History:  Procedure Laterality Date  . CHEST TUBE INSERTION    . ENDOMETRIAL ABLATION       OB History   No obstetric history on file.      Home Medications    Prior to Admission medications   Medication Sig Start Date End Date Taking? Authorizing Provider  acetaminophen (TYLENOL) 650 MG CR tablet Take 650 mg by mouth every 8 (eight) hours as needed for pain.     [provider]  clonazePAM (KLONOPIN) 0.5 MG tablet Take 1 tablet (0.5 mg total) by mouth 2 (two) times daily as needed for up to 10 days for anxiety. 06/06/18 06/16/18  Evalee Jefferson, PA-C  hydrochlorothiazide (HYDRODIURIL) 25 MG tablet Take 25 mg by mouth every morning.  02/11/16   [provider]  HYDROcodone-acetaminophen (NORCO/VICODIN) 5-325 MG tablet Take 1 tablet by mouth every 4  (four) hours as needed. Patient not taking: Reported on 03/06/2018 02/26/18   Isla Pence, MD  ibuprofen (ADVIL,MOTRIN) 600 MG tablet Take 1 tablet (600 mg total) by mouth every 6 (six) hours as needed. 02/26/18   Isla Pence, MD  Magnesium 250 MG TABS Take 250 mg by mouth daily as needed (for cramping).     [provider]  meclizine (ANTIVERT) 25 MG tablet Take 1 tablet (25 mg total) by mouth 3 (three) times daily as needed for dizziness. 06/21/18   Rancour, Annie Main, MD  metoprolol succinate (TOPROL-XL) 50 MG 24 hr tablet Take 50 mg by mouth every morning. Take with or immediately following a meal.    [provider]  ondansetron (ZOFRAN ODT) 4 MG disintegrating tablet Take 1 tablet (4 mg total) by mouth every 8 (eight) hours as needed for nausea or vomiting. 06/21/18   Rancour, Annie Main, MD  pantoprazole (PROTONIX) 40 MG tablet Take 40 mg by mouth every morning.  12/09/15   [provider]  QUEtiapine (SEROQUEL) 400 MG tablet Take 1 tablet (400 mg total) by mouth at bedtime. 06/06/18   Evalee Jefferson, PA-C  rosuvastatin (CRESTOR) 40 MG tablet Take 40 mg by mouth every morning.  02/10/16   [provider]  sertraline (ZOLOFT) 100 MG tablet Take 0.5 tablets (50 mg total) by  mouth daily. 06/06/18   Evalee Jefferson, PA-C    Family History History reviewed. No pertinent family history.  Social History Social History   Tobacco Use  . Smoking status: Current Every Day Smoker    Packs/day: 0.50    Types: Cigarettes  . Smokeless tobacco: Never Used  Substance Use Topics  . Alcohol use: Yes    Comment: occasionally   . Drug use: Never     Allergies   Dimenhydrinate   Review of Systems Review of Systems  Musculoskeletal: Positive for arthralgias, back pain and neck pain.  Neurological: Positive for dizziness and headaches.  All other systems reviewed and are negative.    Physical Exam Updated Vital Signs BP 97/72 (BP Location: Right Arm)   Pulse (!)  112   Temp 97.7 F (36.5 C) (Oral)   Resp 18   Ht 5' 5.5" (1.664 m)   Wt 68 kg   SpO2 99%   BMI 24.58 kg/m   Physical Exam Vitals signs and nursing note reviewed.  Constitutional:      General: She is not in acute distress.    Appearance: Normal appearance. She is well-developed.  HENT:     Head: Normocephalic and atraumatic.     Right Ear: Hearing normal.     Left Ear: Hearing normal.     Nose: Nose normal.  Eyes:     Conjunctiva/sclera: Conjunctivae normal.     Pupils: Pupils are equal, round, and reactive to light.  Neck:     Musculoskeletal: Normal range of motion and neck supple.  Cardiovascular:     Rate and Rhythm: Regular rhythm.     Heart sounds: S1 normal and S2 normal. No murmur. No friction rub. No gallop.   Pulmonary:     Effort: Pulmonary effort is normal. No respiratory distress.     Breath sounds: Normal breath sounds.  Chest:     Chest wall: No tenderness.  Abdominal:     General: Bowel sounds are normal.     Palpations: Abdomen is soft.     Tenderness: There is no abdominal tenderness. There is no guarding or rebound. Negative signs include Murphy's sign and McBurney's sign.     Hernia: No hernia is present.  Musculoskeletal: Normal range of motion.  Skin:    General: Skin is warm and dry.     Findings: No rash.  Neurological:     Mental Status: She is alert and oriented to person, place, and time.     GCS: GCS eye subscore is 4. GCS verbal subscore is 5. GCS motor subscore is 6.     Cranial Nerves: No cranial nerve deficit.     Sensory: No sensory deficit.     Coordination: Coordination normal.     Deep Tendon Reflexes:     Reflex Scores:      Tricep reflexes are 2+ on the right side.      Bicep reflexes are 2+ on the right side.    Comments: Normal strength and sensation bilateral upper extremities  Psychiatric:        Speech: Speech normal.        Behavior: Behavior normal.        Thought Content: Thought content normal.      ED  Treatments / Results  Labs (all labs ordered are listed, but only abnormal results are displayed) Labs Reviewed  CBC WITH DIFFERENTIAL/PLATELET - Abnormal; Notable for the following components:      Result Value   Platelets  406 (*)    All other components within normal limits  BASIC METABOLIC PANEL - Abnormal; Notable for the following components:   Glucose, Bld 104 (*)    Calcium 8.8 (*)    All other components within normal limits  TROPONIN I    EKG None  Radiology Dg Chest 2 View  Result Date: 08/03/2018 CLINICAL DATA:  48 year old female with chest pain. EXAM: CHEST - 2 VIEW COMPARISON:  Chest radiograph dated 06/21/2018 FINDINGS: Mild diffuse interstitial coarsening and chronic bronchitic changes. No focal consolidation, pleural effusion, or pneumothorax. Biapical subpleural thickening/scarring. The cardiac silhouette is within normal limits. There is osteopenia with degenerative changes of the spine. No acute osseous pathology. IMPRESSION: No active cardiopulmonary disease. Electronically Signed   By: Anner Crete M.D.   On: 08/03/2018 02:09    Procedures Procedures (including critical care time)  Medications Ordered in ED Medications  sodium chloride 0.9 % bolus 1,000 mL (1,000 mLs Intravenous New Bag/Given 08/03/18 0133)  ketorolac (TORADOL) 30 MG/ML injection 30 mg (30 mg Intravenous Given 08/03/18 0134)  metoCLOPramide (REGLAN) injection 10 mg (10 mg Intravenous Given 08/03/18 0135)  meclizine (ANTIVERT) tablet 25 mg (25 mg Oral Given 08/03/18 0133)  dexamethasone (DECADRON) injection 10 mg (10 mg Intravenous Given 08/03/18 0248)  oxyCODONE-acetaminophen (PERCOCET/ROXICET) 5-325 MG per tablet 1 tablet (1 tablet Oral Given 08/03/18 0248)     Initial Impression / Assessment and Plan / ED Course  I have reviewed the triage vital signs and the nursing notes.  Pertinent labs & imaging results that were available during my care of the patient were reviewed by me and  considered in my medical decision making (see chart for details).     Patient presents with complaints of headache, dizziness, neck and back pain.  Patient experiencing pain along the right side of her neck that is causing a burning sensation into the right shoulder and burning pain down the right arm.  This is consistent with a cervical radiculopathy.  She has normal strength bilaterally.  Normal reflexes.  Normal sensation.  Motor function is normal across all 3 nerves in the hand.  Does not require any imaging at this time, can follow-up as an outpatient with symptomatic treatment.  Patient reports family history of heart disease.  This is very atypical.  Cardiac evaluation unremarkable.  Patient complaining of headache.  This is not an unusual headache for her.  She reports recent head CT does not want another one tonight.  As her neurologic exam is normal, I do not feel she requires repeat imaging.  She does have a history of vertigo.  Patient treated symptomatically.  Final Clinical Impressions(s) / ED Diagnoses   Final diagnoses:  Cervical radiculopathy  Vertigo    ED Discharge Orders    None       Pollina, Gwenyth Allegra, MD 08/03/18 [phone]

## 2018-12-02 ENCOUNTER — Emergency Department (HOSPITAL_COMMUNITY): Payer: Self-pay

## 2018-12-02 ENCOUNTER — Encounter (HOSPITAL_COMMUNITY): Payer: Self-pay | Admitting: Emergency Medicine

## 2018-12-02 ENCOUNTER — Emergency Department (HOSPITAL_COMMUNITY)
Admission: EM | Admit: 2018-12-02 | Discharge: 2018-12-02 | Disposition: A | Discharge status: Home / Self Care | Payer: Self-pay | Attending: Emergency Medicine | Admitting: Emergency Medicine

## 2018-12-02 ENCOUNTER — Other Ambulatory Visit: Payer: Self-pay

## 2018-12-02 DIAGNOSIS — Z79899 Other long term (current) drug therapy: Secondary | ICD-10-CM | POA: Insufficient documentation

## 2018-12-02 DIAGNOSIS — G8929 Other chronic pain: Secondary | ICD-10-CM | POA: Insufficient documentation

## 2018-12-02 DIAGNOSIS — F17210 Nicotine dependence, cigarettes, uncomplicated: Secondary | ICD-10-CM | POA: Insufficient documentation

## 2018-12-02 DIAGNOSIS — M545 Low back pain: Secondary | ICD-10-CM | POA: Insufficient documentation

## 2018-12-02 DIAGNOSIS — R079 Chest pain, unspecified: Secondary | ICD-10-CM | POA: Insufficient documentation

## 2018-12-02 DIAGNOSIS — R609 Edema, unspecified: Secondary | ICD-10-CM | POA: Insufficient documentation

## 2018-12-02 DIAGNOSIS — I10 Essential (primary) hypertension: Secondary | ICD-10-CM | POA: Insufficient documentation

## 2018-12-02 LAB — PREGNANCY, URINE: Preg Test, Ur: NEGATIVE

## 2018-12-02 LAB — CBC WITH DIFFERENTIAL/PLATELET
Abs Immature Granulocytes: 0.04 10*3/uL (ref 0.00–0.07)
Basophils Absolute: 0 10*3/uL (ref 0.0–0.1)
Basophils Relative: 0 %
Eosinophils Absolute: 0.1 10*3/uL (ref 0.0–0.5)
Eosinophils Relative: 2 %
HCT: 39 % (ref 36.0–46.0)
Hemoglobin: 12.4 g/dL (ref 12.0–15.0)
Immature Granulocytes: 1 %
Lymphocytes Relative: 28 %
Lymphs Abs: 2.2 10*3/uL (ref 0.7–4.0)
MCH: 31.6 pg (ref 26.0–34.0)
MCHC: 31.8 g/dL (ref 30.0–36.0)
MCV: 99.5 fL (ref 80.0–100.0)
Monocytes Absolute: 0.8 10*3/uL (ref 0.1–1.0)
Monocytes Relative: 10 %
Neutro Abs: 4.7 10*3/uL (ref 1.7–7.7)
Neutrophils Relative %: 59 %
Platelets: 366 10*3/uL (ref 150–400)
RBC: 3.92 MIL/uL (ref 3.87–5.11)
RDW: 14.4 % (ref 11.5–15.5)
WBC: 7.8 10*3/uL (ref 4.0–10.5)
nRBC: 0 % (ref 0.0–0.2)

## 2018-12-02 LAB — TROPONIN I: Troponin I: <0.03 ng/mL (ref <0.03)

## 2018-12-02 LAB — COMPREHENSIVE METABOLIC PANEL
ALT: 25 U/L (ref 0–44)
AST: 22 U/L (ref 15–41)
Albumin: 3.3 g/dL — ABNORMAL LOW (ref 3.5–5.0)
Alkaline Phosphatase: 91 U/L (ref 38–126)
Anion gap: 11 (ref 5–15)
BUN: 14 mg/dL (ref 6–20)
CO2: 23 mmol/L (ref 22–32)
Calcium: 8.5 mg/dL — ABNORMAL LOW (ref 8.9–10.3)
Chloride: 107 mmol/L (ref 98–111)
Creatinine, Ser: 0.85 mg/dL (ref 0.44–1.00)
GFR calc Af Amer: >60 mL/min (ref >60)
GFR calc non Af Amer: >60 mL/min (ref >60)
Glucose, Bld: 100 mg/dL — ABNORMAL HIGH (ref 70–99)
Potassium: 4.2 mmol/L (ref 3.5–5.1)
Sodium: 141 mmol/L (ref 135–145)
Total Bilirubin: 0.5 mg/dL (ref 0.3–1.2)
Total Protein: 6.3 g/dL — ABNORMAL LOW (ref 6.5–8.1)

## 2018-12-02 LAB — URINALYSIS, ROUTINE W REFLEX MICROSCOPIC
Bacteria, UA: NONE SEEN
Bilirubin Urine: NEGATIVE
Glucose, UA: NEGATIVE mg/dL
Ketones, ur: NEGATIVE mg/dL
Leukocytes,Ua: NEGATIVE
Nitrite: NEGATIVE
Protein, ur: NEGATIVE mg/dL
Specific Gravity, Urine: 1.012 (ref 1.005–1.030)
pH: 5 (ref 5.0–8.0)

## 2018-12-02 LAB — LIPASE, BLOOD: Lipase: 29 U/L (ref 11–51)

## 2018-12-02 MED ORDER — CYCLOBENZAPRINE HCL 10 MG PO TABS: 10.0000 mg | ORAL_TABLET | Freq: Three times a day (TID) | ORAL | 0 refills | Status: DC | PRN

## 2018-12-02 MED ORDER — KETOROLAC TROMETHAMINE 30 MG/ML IJ SOLN
30.0000 mg | Freq: Once | INTRAMUSCULAR | Status: AC
  Administered 2018-12-02: 30 mg via INTRAVENOUS
  Filled 2018-12-02: qty 1

## 2018-12-02 NOTE — ED Triage Notes (Signed)
Pt c/o of central chest pain since march, back pain x 3 months and bilateral ankle swelling x 3 days .

## 2018-12-02 NOTE — ED Provider Notes (Signed)
[facility] EMERGENCY DEPARTMENT Provider Note   CSN: [id number] Arrival date & time: 12/02/18  1629    History   Chief Complaint Chief Complaint  Patient presents with  . Chest Pain  . Foot Swelling    HPI Sherry [name] is a 48 y.o. female.  She is present in the emergency department with multiple complaints.  She said both of her ankles have been swelling since this morning.  They feel numb.  No trauma.  She is also had chest pain daily left-sided into her left axilla and shoulder and neck that is been going on for months since her doctor took her off her Xanax.  It seems worse when she is stressed out.  She is also had worsening low back pain and feels that there are knots there on the sides of her spine that when she pushes on them cause severe pain.  She is also had a little bit of right upper quadrant pain intermittently for a few months.  No fevers or chills no vomiting or diarrhea no urinary symptoms.  No recent travel or sick exposures.     The history is provided by the patient.  Chest Pain  Pain location:  L chest Pain quality: aching   Pain radiates to:  Neck and L shoulder Pain severity:  Moderate Onset quality:  Gradual Timing:  Intermittent Progression:  Unchanged Chronicity:  New Context: stress   Relieved by:  Nothing Exacerbated by: stress. Ineffective treatments:  None tried Associated symptoms: abdominal pain, anxiety, back pain, lower extremity edema and numbness   Associated symptoms: no cough, no fever, no nausea, no shortness of breath and no vomiting   Risk factors: smoking   Risk factors: no prior DVT/PE     Past Medical History:  Diagnosis Date  . Depression   . High cholesterol   . Hypertension   . Lung collapse    left    There are no active problems to display for this patient.   Past Surgical History:  Procedure Laterality Date  . CHEST TUBE INSERTION    . ENDOMETRIAL ABLATION       OB History   No obstetric history on file.      Home Medications    Prior to Admission medications   Medication Sig Start Date End Date Taking? Authorizing Provider  acetaminophen (TYLENOL) 650 MG CR tablet Take 650 mg by mouth every 8 (eight) hours as needed for pain.     [provider]  clonazePAM (KLONOPIN) 0.5 MG tablet Take 1 tablet (0.5 mg total) by mouth 2 (two) times daily as needed for up to 10 days for anxiety. 06/06/18 06/16/18  Evalee Jefferson, PA-C  hydrochlorothiazide (HYDRODIURIL) 25 MG tablet Take 25 mg by mouth every morning.  02/11/16   [provider]  ibuprofen (ADVIL,MOTRIN) 600 MG tablet Take 1 tablet (600 mg total) by mouth every 6 (six) hours as needed. 02/26/18   Isla Pence, MD  Magnesium 250 MG TABS Take 250 mg by mouth daily as needed (for cramping).     [provider]  meclizine (ANTIVERT) 25 MG tablet Take 1 tablet (25 mg total) by mouth 3 (three) times daily as needed for dizziness. 08/03/18   Orpah Greek, MD  methocarbamol (ROBAXIN) 500 MG tablet Take 1 tablet (500 mg total) by mouth every 8 (eight) hours as needed for muscle spasms. 08/03/18   Orpah Greek, MD  metoprolol succinate (TOPROL-XL) 50 MG 24 hr tablet Take 50 mg  by mouth every morning. Take with or immediately following a meal.    [provider]  ondansetron (ZOFRAN ODT) 4 MG disintegrating tablet Take 1 tablet (4 mg total) by mouth every 8 (eight) hours as needed for nausea or vomiting. 06/21/18   Rancour, Annie Main, MD  pantoprazole (PROTONIX) 40 MG tablet Take 40 mg by mouth every morning.  12/09/15   [provider]  predniSONE (DELTASONE) 20 MG tablet 3 tabs po daily x 3 days, then 2 tabs x 3 days, then 1.5 tabs x 3 days, then 1 tab x 3 days, then 0.5 tabs x 3 days 08/03/18   Orpah Greek, MD  QUEtiapine (SEROQUEL) 400 MG tablet Take 1 tablet (400 mg total) by mouth at bedtime. 06/06/18   Evalee Jefferson, PA-C  rosuvastatin (CRESTOR) 40 MG tablet Take 40 mg by mouth every morning.   02/10/16   [provider]  sertraline (ZOLOFT) 100 MG tablet Take 0.5 tablets (50 mg total) by mouth daily. 06/06/18   Evalee Jefferson, PA-C  traMADol (ULTRAM) 50 MG tablet Take 1 tablet (50 mg total) by mouth every 6 (six) hours as needed. 08/03/18   Orpah Greek, MD    Family History No family history on file.  Social History Social History   Tobacco Use  . Smoking status: Current Every Day Smoker    Packs/day: 0.50    Types: Cigarettes  . Smokeless tobacco: Never Used  Substance Use Topics  . Alcohol use: Yes    Comment: occasionally   . Drug use: Never     Allergies   Dimenhydrinate   Review of Systems Review of Systems  Constitutional: Negative for fever.  HENT: Negative for sore throat.   Eyes: Negative for visual disturbance.  Respiratory: Negative for cough and shortness of breath.   Cardiovascular: Positive for chest pain and leg swelling.  Gastrointestinal: Positive for abdominal pain. Negative for nausea and vomiting.  Genitourinary: Negative for dysuria.  Musculoskeletal: Positive for back pain.  Skin: Negative for rash.  Neurological: Positive for numbness.     Physical Exam Updated Vital Signs BP 113/69   Pulse 90   Temp 98.5 F (36.9 C)   Resp (!) 21   SpO2 98%   Physical Exam Vitals signs and nursing note reviewed.  Constitutional:      General: She is not in acute distress.    Appearance: She is well-developed.  HENT:     Head: Normocephalic and atraumatic.  Eyes:     Conjunctiva/sclera: Conjunctivae normal.  Neck:     Musculoskeletal: Normal range of motion and neck supple.     Thyroid: No thyromegaly.  Cardiovascular:     Rate and Rhythm: Normal rate and regular rhythm.     Heart sounds: Normal heart sounds. No murmur.  Pulmonary:     Effort: Pulmonary effort is normal. No respiratory distress.     Breath sounds: Normal breath sounds.  Abdominal:     Palpations: Abdomen is soft. There is no mass.     Tenderness:  There is no abdominal tenderness.  Musculoskeletal: Normal range of motion.     Right lower leg: She exhibits no tenderness.     Left lower leg: She exhibits no tenderness.  Skin:    General: Skin is warm and dry.     Capillary Refill: Capillary refill takes less than 2 seconds.  Neurological:     General: No focal deficit present.     Mental Status: She is alert and  oriented to person, place, and time.     Motor: No weakness.      ED Treatments / Results  Labs (all labs ordered are listed, but only abnormal results are displayed) Labs Reviewed  COMPREHENSIVE METABOLIC PANEL - Abnormal; Notable for the following components:      Result Value   Glucose, Bld 100 (*)    Calcium 8.5 (*)    Total Protein 6.3 (*)    Albumin 3.3 (*)    All other components within normal limits  URINALYSIS, ROUTINE W REFLEX MICROSCOPIC - Abnormal; Notable for the following components:   Hgb urine dipstick SMALL (*)    All other components within normal limits  LIPASE, BLOOD  TROPONIN I  CBC WITH DIFFERENTIAL/PLATELET  PREGNANCY, URINE    EKG EKG Interpretation  Date/Time:  Saturday Dec 02 2018 16:48:03 EDT Ventricular Rate:  94 PR Interval:    QRS Duration: 98 QT Interval:  368 QTC Calculation: 461 R Axis:   101 Text Interpretation:  Sinus rhythm Right axis deviation Borderline T abnormalities, anterior leads Baseline wander in lead(s) II aVF similar to prior 1/20 Confirmed by Aletta Edouard [phone]) on 12/02/2018 4:53:47 PM   Radiology Dg Chest 2 View  Result Date: 12/02/2018 CLINICAL DATA:  Chest pain EXAM: CHEST - 2 VIEW COMPARISON:  August 03, 2018 and June 21, 2018 FINDINGS: There is chronic interstitial thickening bilaterally with areas of scarring in each upper lobe. There is no frank edema or consolidation. The heart size and pulmonary vascularity are normal. No adenopathy. No evident bone lesions. IMPRESSION: Chronic interstitial thickening with scattered areas of scarring. No  frank edema or consolidation. No new opacity evident. Stable cardiac silhouette. No evident adenopathy. Electronically Signed   By: Lowella Grip III M.D.   On: 12/02/2018 17:25    Procedures Procedures (including critical care time)  Medications Ordered in ED Medications  ketorolac (TORADOL) 30 MG/ML injection 30 mg (30 mg Intravenous Given by Other 12/02/18 1749)     Initial Impression / Assessment and Plan / ED Course  I have reviewed the triage vital signs and the nursing notes.  Pertinent labs & imaging results that were available during my care of the patient were reviewed by me and considered in my medical decision making (see chart for details).  Clinical Course as of Dec 03 1215  Sat Dec 02, 6155  [id number] 48 year old female here with various complaints including ankle swelling chest pain abdominal pain back pain.  Her exam and vital signs are unremarkable.  Differential diagnosis includes ACS, pneumonia, pneumothorax, cholelithiasis, renal insufficiency.    [MB]  3536 Patient's lab work-up and chest x-ray EKG have been fairly unremarkable.  I think the best I can do for her is probably 4 months of muscle spasm medication for her back.  She will need to follow-up with her primary care doctor regarding her anxiety medications   [MB]  1801 I reviewed the results with the patient and answered her questions to the best my ability.  She said she has been on Flexeril in the past with some improvement.   [MB]    Clinical Course User Index [MB] Hayden Rasmussen, MD   Evon Dejarnett was evaluated in Emergency Department on 12/02/2018 for the symptoms described in the history of present illness. She was evaluated in the context of the global COVID-19 pandemic, which necessitated consideration that the patient might be at risk for infection with the SARS-CoV-2 virus that causes COVID-19. Institutional protocols and algorithms  that pertain to the evaluation of patients at risk for COVID-19 are  in a state of rapid change based on information released by regulatory bodies including the CDC and federal and state organizations. These policies and algorithms were followed during the patient's care in the ED.       Final Clinical Impressions(s) / ED Diagnoses   Final diagnoses:  Nonspecific chest pain  Chronic bilateral low back pain, unspecified whether sciatica present  Peripheral edema    ED Discharge Orders         Ordered    cyclobenzaprine (FLEXERIL) 10 MG tablet  3 times daily PRN     12/02/18 1803           Hayden Rasmussen, MD 12/03/18 1217

## 2018-12-02 NOTE — Discharge Instructions (Signed)
You were seen in the emergency department for various complaints including chest pain back pain and swelling in your ankles.  You had blood work urinalysis EKG and chest x-ray that did not show an obvious explanation for your symptoms.  You should continue your regular medications and we are starting you on a medication to help with muscle spasms.  Please contact your primary care doctor for further evaluation of your symptoms.  Return if any concerns.

## 2018-12-02 NOTE — ED Notes (Signed)
ED Provider at bedside. 

## 2018-12-02 NOTE — ED Notes (Signed)
Patient transported to X-ray 

## 2019-03-03 ENCOUNTER — Other Ambulatory Visit: Payer: Self-pay

## 2019-03-03 ENCOUNTER — Encounter (HOSPITAL_COMMUNITY): Payer: Self-pay

## 2019-03-03 ENCOUNTER — Emergency Department (HOSPITAL_COMMUNITY)
Admission: EM | Admit: 2019-03-03 | Discharge: 2019-03-04 | Disposition: A | Discharge status: Home / Self Care | Payer: Self-pay | Attending: Emergency Medicine | Admitting: Emergency Medicine

## 2019-03-03 DIAGNOSIS — F17210 Nicotine dependence, cigarettes, uncomplicated: Secondary | ICD-10-CM | POA: Insufficient documentation

## 2019-03-03 DIAGNOSIS — K5900 Constipation, unspecified: Secondary | ICD-10-CM | POA: Insufficient documentation

## 2019-03-03 DIAGNOSIS — I10 Essential (primary) hypertension: Secondary | ICD-10-CM | POA: Insufficient documentation

## 2019-03-03 DIAGNOSIS — N390 Urinary tract infection, site not specified: Secondary | ICD-10-CM | POA: Insufficient documentation

## 2019-03-03 DIAGNOSIS — Z79899 Other long term (current) drug therapy: Secondary | ICD-10-CM | POA: Insufficient documentation

## 2019-03-03 LAB — COMPREHENSIVE METABOLIC PANEL
ALT: 17 U/L (ref 0–44)
AST: 19 U/L (ref 15–41)
Albumin: 3.8 g/dL (ref 3.5–5.0)
Alkaline Phosphatase: 94 U/L (ref 38–126)
Anion gap: 9 (ref 5–15)
BUN: 11 mg/dL (ref 6–20)
CO2: 30 mmol/L (ref 22–32)
Calcium: 9.1 mg/dL (ref 8.9–10.3)
Chloride: 101 mmol/L (ref 98–111)
Creatinine, Ser: 0.77 mg/dL (ref 0.44–1.00)
GFR calc Af Amer: >60 mL/min (ref >60)
GFR calc non Af Amer: >60 mL/min (ref >60)
Glucose, Bld: 98 mg/dL (ref 70–99)
Potassium: 4 mmol/L (ref 3.5–5.1)
Sodium: 140 mmol/L (ref 135–145)
Total Bilirubin: 0.3 mg/dL (ref 0.3–1.2)
Total Protein: 7 g/dL (ref 6.5–8.1)

## 2019-03-03 LAB — CBC
HCT: 42 % (ref 36.0–46.0)
Hemoglobin: 13.3 g/dL (ref 12.0–15.0)
MCH: 30.6 pg (ref 26.0–34.0)
MCHC: 31.7 g/dL (ref 30.0–36.0)
MCV: 96.6 fL (ref 80.0–100.0)
Platelets: 373 10*3/uL (ref 150–400)
RBC: 4.35 MIL/uL (ref 3.87–5.11)
RDW: 15.4 % (ref 11.5–15.5)
WBC: 9.5 10*3/uL (ref 4.0–10.5)
nRBC: 0 % (ref 0.0–0.2)

## 2019-03-03 LAB — URINALYSIS, ROUTINE W REFLEX MICROSCOPIC
Bilirubin Urine: NEGATIVE
Glucose, UA: NEGATIVE mg/dL
Ketones, ur: NEGATIVE mg/dL
Nitrite: NEGATIVE
Protein, ur: NEGATIVE mg/dL
RBC / HPF: >50 RBC/hpf — ABNORMAL HIGH (ref 0–5)
Specific Gravity, Urine: 1.006 (ref 1.005–1.030)
WBC, UA: >50 WBC/hpf — ABNORMAL HIGH (ref 0–5)
pH: 6 (ref 5.0–8.0)

## 2019-03-03 LAB — LIPASE, BLOOD: Lipase: 29 U/L (ref 11–51)

## 2019-03-03 LAB — PREGNANCY, URINE: Preg Test, Ur: NEGATIVE

## 2019-03-03 MED ORDER — ONDANSETRON HCL 4 MG/2ML IJ SOLN
4.0000 mg | Freq: Once | INTRAMUSCULAR | Status: AC
  Administered 2019-03-03: 4 mg via INTRAVENOUS
  Filled 2019-03-03: qty 2

## 2019-03-03 MED ORDER — SODIUM CHLORIDE 0.9% FLUSH: 3.0000 mL | Freq: Once | INTRAVENOUS | Status: DC

## 2019-03-03 MED ORDER — MORPHINE SULFATE (PF) 4 MG/ML IV SOLN
4.0000 mg | Freq: Once | INTRAVENOUS | Status: AC
  Administered 2019-03-03: 4 mg via INTRAVENOUS
  Filled 2019-03-03: qty 1

## 2019-03-03 NOTE — ED Triage Notes (Signed)
Pt believes she has kidney stones. States she has blood in her urine as well as dysuria. Feels like she has to go continuously.

## 2019-03-03 NOTE — ED Notes (Signed)
ED Provider at bedside. 

## 2019-03-04 ENCOUNTER — Emergency Department (HOSPITAL_COMMUNITY): Payer: Self-pay

## 2019-03-04 MED ORDER — HYDROCODONE-ACETAMINOPHEN 5-325 MG PO TABS
1.0000 | ORAL_TABLET | Freq: Once | ORAL | Status: AC
  Administered 2019-03-04: 02:00:00 1 via ORAL
  Filled 2019-03-04: qty 1

## 2019-03-04 MED ORDER — CEPHALEXIN 500 MG PO CAPS: 500.0000 mg | ORAL_CAPSULE | Freq: Two times a day (BID) | ORAL | 0 refills | Status: DC

## 2019-03-04 MED ORDER — HYDROCODONE-ACETAMINOPHEN 5-325 MG PO TABS: 1.0000 | ORAL_TABLET | ORAL | 0 refills | Status: DC | PRN

## 2019-03-04 MED ORDER — POLYETHYLENE GLYCOL 3350 17 G PO PACK: 17.0000 g | PACK | Freq: Every day | ORAL | 0 refills | Status: DC

## 2019-03-04 MED ORDER — CEPHALEXIN 500 MG PO CAPS
500.0000 mg | ORAL_CAPSULE | Freq: Once | ORAL | Status: AC
  Administered 2019-03-04: 02:00:00 500 mg via ORAL
  Filled 2019-03-04: qty 1

## 2019-03-04 MED ORDER — PHENAZOPYRIDINE HCL 200 MG PO TABS: 200.0000 mg | ORAL_TABLET | Freq: Three times a day (TID) | ORAL | 0 refills | Status: DC

## 2019-03-04 MED ORDER — BISACODYL 5 MG PO TBEC
5.0000 mg | DELAYED_RELEASE_TABLET | Freq: Once | ORAL | Status: AC
  Administered 2019-03-04: 02:00:00 5 mg via ORAL
  Filled 2019-03-04: qty 1

## 2019-03-04 MED ORDER — PHENAZOPYRIDINE HCL 100 MG PO TABS
200.0000 mg | ORAL_TABLET | Freq: Once | ORAL | Status: AC
  Administered 2019-03-04: 02:00:00 200 mg via ORAL
  Filled 2019-03-04: qty 2

## 2019-03-04 NOTE — Discharge Instructions (Addendum)
Complete the entire course of the antibiotics prescribed taking your next tablet tomorrow when you wake.  Use the remaining Pyridium to help you with the urinary symptoms, note that this medication will turn your urine bright yellowish-orange and is normal.  Your CT scan shows that you are very constipated which may also be causing your back pain.  You were given a Dulcolax laxative tablet this evening that should work within 12 hours.  I recommend taking MiraLAX daily which has been prescribed which is a safe daily medicine to help prevent constipation.  Your CT scan showed [place] a glimpse of your lower edges of your lungs and there is a suggestion of an possible interstitial lung disease which can occur with chronic scarring.  It is recommended that she have a CT scan of your chest to further define this observation.  Please call your doctor to arrange this for you.  You do not have a kidney stone.

## 2019-03-04 NOTE — ED Notes (Signed)
Patient transported to CT 

## 2019-03-04 NOTE — ED Provider Notes (Signed)
[facility] EMERGENCY DEPARTMENT Provider Note   CSN: [id number] Arrival date & time: 03/03/19  1620     History   Chief Complaint Chief Complaint  Patient presents with   Dysuria    HPI Sherry [name] is a 48 y.o. female with a history of HTN, depression and kidney stones presenting with increased urinary frequency, urgency, hematuria and right lower back pain which started 3 days ago.  She denies fevers or chills, but has had n/v when pain becomes severe. Reports sx similar to prior kidney stone.   Denies diarrhea, has had constipation, last bm 3 days ago.  Denies vaginal dc.  Also denies pain in legs, no urinary incontinence, no peripheral weakness/numbness.  No hx of sig low back pain.  She has found no alleviators for her symptoms. She has taken tylenol without relief.      The history is provided by the patient.    Past Medical History:  Diagnosis Date   Depression    High cholesterol    Hypertension    Lung collapse    left    There are no active problems to display for this patient.   Past Surgical History:  Procedure Laterality Date   CHEST TUBE INSERTION     ENDOMETRIAL ABLATION       OB History   No obstetric history on file.      Home Medications    Prior to Admission medications   Medication Sig Start Date End Date Taking? Authorizing Provider  acetaminophen (TYLENOL) 650 MG CR tablet Take 650 mg by mouth every 8 (eight) hours as needed for pain.     [provider]  cephALEXin (KEFLEX) 500 MG capsule Take 1 capsule (500 mg total) by mouth 2 (two) times daily. 03/04/19   Evalee Jefferson, PA-C  clonazePAM (KLONOPIN) 0.5 MG tablet Take 1 tablet (0.5 mg total) by mouth 2 (two) times daily as needed for up to 10 days for anxiety. 06/06/18 06/16/18  Evalee Jefferson, PA-C  cyclobenzaprine (FLEXERIL) 10 MG tablet Take 1 tablet (10 mg total) by mouth 3 (three) times daily as needed for muscle spasms. 12/02/18   Hayden Rasmussen, MD  hydrochlorothiazide  (HYDRODIURIL) 25 MG tablet Take 25 mg by mouth every morning.  02/11/16   [provider]  ibuprofen (ADVIL,MOTRIN) 600 MG tablet Take 1 tablet (600 mg total) by mouth every 6 (six) hours as needed. 02/26/18   Isla Pence, MD  Magnesium 250 MG TABS Take 250 mg by mouth daily as needed (for cramping).     [provider]  meclizine (ANTIVERT) 25 MG tablet Take 1 tablet (25 mg total) by mouth 3 (three) times daily as needed for dizziness. 08/03/18   Orpah Greek, MD  methocarbamol (ROBAXIN) 500 MG tablet Take 1 tablet (500 mg total) by mouth every 8 (eight) hours as needed for muscle spasms. 08/03/18   Orpah Greek, MD  metoprolol succinate (TOPROL-XL) 50 MG 24 hr tablet Take 50 mg by mouth every morning. Take with or immediately following a meal.    [provider]  ondansetron (ZOFRAN ODT) 4 MG disintegrating tablet Take 1 tablet (4 mg total) by mouth every 8 (eight) hours as needed for nausea or vomiting. 06/21/18   Rancour, Annie Main, MD  pantoprazole (PROTONIX) 40 MG tablet Take 40 mg by mouth every morning.  12/09/15   [provider]  phenazopyridine (PYRIDIUM) 200 MG tablet Take 1 tablet (200 mg total) by mouth 3 (three) times daily.  03/04/19   Evalee Jefferson, PA-C  polyethylene glycol (MIRALAX / GLYCOLAX) 17 g packet Take 17 g by mouth daily. 03/04/19   Donnika Kucher, Almyra Free, PA-C  predniSONE (DELTASONE) 20 MG tablet 3 tabs po daily x 3 days, then 2 tabs x 3 days, then 1.5 tabs x 3 days, then 1 tab x 3 days, then 0.5 tabs x 3 days 08/03/18   Orpah Greek, MD  QUEtiapine (SEROQUEL) 400 MG tablet Take 1 tablet (400 mg total) by mouth at bedtime. 06/06/18   Evalee Jefferson, PA-C  rosuvastatin (CRESTOR) 40 MG tablet Take 40 mg by mouth every morning.  02/10/16   [provider]  sertraline (ZOLOFT) 100 MG tablet Take 0.5 tablets (50 mg total) by mouth daily. 06/06/18   Evalee Jefferson, PA-C  traMADol (ULTRAM) 50 MG tablet Take 1 tablet (50 mg total) by  mouth every 6 (six) hours as needed. 08/03/18   Orpah Greek, MD    Family History No family history on file.  Social History Social History   Tobacco Use   Smoking status: Current Every Day Smoker    Packs/day: 0.50    Types: Cigarettes   Smokeless tobacco: Never Used  Substance Use Topics   Alcohol use: Yes    Comment: occasionally    Drug use: Never     Allergies   Dimenhydrinate   Review of Systems Review of Systems  Constitutional: Negative for chills and fever.  HENT: Negative for congestion and sore throat.   Eyes: Negative.   Respiratory: Negative for chest tightness and shortness of breath.   Cardiovascular: Negative for chest pain.  Gastrointestinal: Positive for nausea and vomiting. Negative for abdominal pain.  Genitourinary: Positive for dysuria, flank pain, hematuria and urgency.  Musculoskeletal: Negative for arthralgias, joint swelling and neck pain.  Skin: Negative.  Negative for rash and wound.  Neurological: Negative for dizziness, weakness, light-headedness, numbness and headaches.  Psychiatric/Behavioral: Negative.      Physical Exam Updated Vital Signs BP (!) 118/92 (BP Location: Left Arm)    Pulse 85    Temp 98.5 F (36.9 C) (Oral)    Resp 15    Ht 5\' 5"  (1.651 m)    Wt 81.6 kg    SpO2 96%    BMI 29.95 kg/m   Physical Exam Vitals signs and nursing note reviewed.  Constitutional:      Appearance: She is well-developed.  HENT:     Head: Normocephalic and atraumatic.  Eyes:     Conjunctiva/sclera: Conjunctivae normal.  Neck:     Musculoskeletal: Normal range of motion.  Cardiovascular:     Rate and Rhythm: Normal rate and regular rhythm.     Heart sounds: Normal heart sounds.  Pulmonary:     Effort: Pulmonary effort is normal.     Breath sounds: Normal breath sounds. No wheezing.  Abdominal:     General: Bowel sounds are normal.     Palpations: Abdomen is soft.     Tenderness: There is no abdominal tenderness. There is  no right CVA tenderness or left CVA tenderness.     Comments: No cva ttp.  Bilateral low back soreness.  Musculoskeletal: Normal range of motion.  Skin:    General: Skin is warm and dry.  Neurological:     Mental Status: She is alert.      ED Treatments / Results  Labs (all labs ordered are listed, but only abnormal results are displayed) Labs Reviewed  URINALYSIS, ROUTINE W REFLEX MICROSCOPIC -  Abnormal; Notable for the following components:      Result Value   APPearance CLOUDY (*)    Hgb urine dipstick MODERATE (*)    Leukocytes,Ua LARGE (*)    RBC / HPF >50 (*)    WBC, UA >50 (*)    Bacteria, UA RARE (*)    All other components within normal limits  LIPASE, BLOOD  COMPREHENSIVE METABOLIC PANEL  CBC  PREGNANCY, URINE    EKG None  Radiology Ct Renal Stone Study  Result Date: 03/04/2019 CLINICAL DATA:  Flank pain. Recurrent stone disease suspected. Patient reports bilateral lower abdominal pain. Hematuria, dysuria, frequent urination. Nausea. EXAM: CT ABDOMEN AND PELVIS WITHOUT CONTRAST TECHNIQUE: Multidetector CT imaging of the abdomen and pelvis was performed following the standard protocol without IV contrast. COMPARISON:  None. FINDINGS: Lower chest: Subpleural reticulation and possible honeycombing at the bases. No pleural fluid or confluent airspace disease. Hepatobiliary: No focal liver abnormality is seen. Gallbladder is physiologically distended without wall thickening or calcified gallstone. Common bile duct dilatation measuring 13 mm at the porta hepatis and tapering distally. No visualized choledocholithiasis. Pancreas: No ductal dilatation or inflammation. Spleen: Normal in size without focal abnormality. Adrenals/Urinary Tract: Left adrenal thickening without dominant nodule. Normal right adrenal gland. No hydronephrosis or perinephric edema. No renal or ureteral stones. Both ureters are decompressed without stones along the course. Urinary bladder is partially  distended. No bladder wall thickening. No bladder stone. Stomach/Bowel: Stomach is within normal limits. Appendix appears normal. No evidence of bowel wall thickening, distention, or inflammatory changes. Large colonic stool burden. There is colonic tortuosity. Vascular/Lymphatic: Mild aortoiliac atherosclerosis. No abdominopelvic adenopathy. Reproductive: Uterus and bilateral adnexa are unremarkable. Other: No free air, free fluid, or intra-abdominal fluid collection. Musculoskeletal: Degenerative disc disease at L5-S1. There are no acute or suspicious osseous abnormalities. IMPRESSION: 1. No renal stones or obstructive uropathy. 2. Large colonic stool burden with colonic tortuosity, can be seen with constipation. 3. Common bile duct dilatation without abnormal gallbladder distention. In the setting of normal LFTs, no dedicated further imaging is needed. 4. Subpleural reticulation at the lung bases with possible areas of honeycombing. Findings may represent interstitial lung disease but only partially included. Consider nonemergent high-resolution chest CT on an elective basis. Aortic Atherosclerosis (ICD10-I70.0). Electronically Signed   By: Keith Rake M.D.   On: 03/04/2019 01:36    Procedures Procedures (including critical care time)  Medications Ordered in ED Medications  sodium chloride flush (NS) 0.9 % injection 3 mL (has no administration in time range)  cephALEXin (KEFLEX) capsule 500 mg (has no administration in time range)  phenazopyridine (PYRIDIUM) tablet 200 mg (has no administration in time range)  HYDROcodone-acetaminophen (NORCO/VICODIN) 5-325 MG per tablet 1 tablet (has no administration in time range)  bisacodyl (DULCOLAX) EC tablet 5 mg (has no administration in time range)  ondansetron (ZOFRAN) injection 4 mg (4 mg Intravenous Given 03/03/19 2343)  morphine 4 MG/ML injection 4 mg (4 mg Intravenous Given 03/03/19 2343)     Initial Impression / Assessment and Plan / ED Course   I have reviewed the triage vital signs and the nursing notes.  Pertinent labs & imaging results that were available during my care of the patient were reviewed by me and considered in my medical decision making (see chart for details).        Labs and imaging reviewed, interpreted and discussed with pt.  Normal cmet and kidney function, normal wbc count. UA sig for increased rbc's and wbc's, urine cx  ordered. Pt tx for uti, keflex started.  Also discussed constipation, given dulcolax here, prescribed miralax for daily use.  Return precautions discussed.  The patient appears reasonably screened and/or stabilized for discharge and I doubt any other medical condition or other [facility] requiring further screening, evaluation, or treatment in the ED at this time prior to discharge.   Final Clinical Impressions(s) / ED Diagnoses   Final diagnoses:  Lower urinary tract infectious disease  Constipation, unspecified constipation type    ED Discharge Orders         Ordered    phenazopyridine (PYRIDIUM) 200 MG tablet  3 times daily     03/04/19 0146    cephALEXin (KEFLEX) 500 MG capsule  2 times daily     03/04/19 0146    polyethylene glycol (MIRALAX / GLYCOLAX) 17 g packet  Daily     03/04/19 0150           Evalee Jefferson, PA-C 03/04/19 1244    Ezequiel Essex, MD 03/04/19 2303

## 2019-03-05 MED FILL — Hydrocodone-Acetaminophen Tab 5-325 MG: ORAL | Qty: 6 | Status: AC

## 2019-05-22 ENCOUNTER — Other Ambulatory Visit: Payer: Self-pay | Admitting: Internal Medicine

## 2019-05-22 ENCOUNTER — Telehealth: Payer: Self-pay | Admitting: *Deleted

## 2019-05-22 ENCOUNTER — Other Ambulatory Visit (HOSPITAL_COMMUNITY): Payer: Self-pay | Admitting: Internal Medicine

## 2019-05-22 DIAGNOSIS — F17200 Nicotine dependence, unspecified, uncomplicated: Secondary | ICD-10-CM

## 2019-05-22 DIAGNOSIS — R935 Abnormal findings on diagnostic imaging of other abdominal regions, including retroperitoneum: Secondary | ICD-10-CM

## 2019-05-24 ENCOUNTER — Telehealth: Payer: Self-pay | Admitting: *Deleted

## 2019-05-29 ENCOUNTER — Encounter: Payer: Self-pay | Admitting: Gastroenterology

## 2019-06-20 ENCOUNTER — Other Ambulatory Visit: Payer: Self-pay

## 2019-06-20 DIAGNOSIS — Z20822 Contact with and (suspected) exposure to covid-19: Secondary | ICD-10-CM

## 2019-06-21 ENCOUNTER — Other Ambulatory Visit: Payer: Self-pay

## 2019-06-21 ENCOUNTER — Encounter: Payer: Self-pay | Admitting: Nurse Practitioner

## 2019-06-21 ENCOUNTER — Ambulatory Visit (INDEPENDENT_AMBULATORY_CARE_PROVIDER_SITE_OTHER): Payer: BC Managed Care – PPO | Admitting: Nurse Practitioner

## 2019-06-21 DIAGNOSIS — K219 Gastro-esophageal reflux disease without esophagitis: Secondary | ICD-10-CM

## 2019-06-21 DIAGNOSIS — K5900 Constipation, unspecified: Secondary | ICD-10-CM

## 2019-06-21 DIAGNOSIS — Z1211 Encounter for screening for malignant neoplasm of colon: Secondary | ICD-10-CM

## 2019-06-21 DIAGNOSIS — Z0000 Encounter for general adult medical examination without abnormal findings: Secondary | ICD-10-CM | POA: Diagnosis not present

## 2019-06-21 LAB — NOVEL CORONAVIRUS, NAA: SARS-CoV-2, NAA: NOT DETECTED

## 2019-06-21 MED ORDER — PEG 3350-KCL-NA BICARB-NACL 420 G PO SOLR: 4000.0000 mL | ORAL | 0 refills | Status: DC

## 2019-06-21 MED ORDER — PANTOPRAZOLE SODIUM 40 MG PO TBEC: 40.0000 mg | DELAYED_RELEASE_TABLET | Freq: Two times a day (BID) | ORAL | 3 refills | Status: DC

## 2019-06-21 NOTE — Assessment & Plan Note (Signed)
Patient had colonoscopy over 10 years ago and was in [place].  She cannot remember where performed is.  She states they found polyps and was not given recommended repeat exam..  She is turning 14 January.  She is currently due for screening colonoscopy.  The only symptom she is having some constipation and GERD which we will manage as per below.  No overt/red flag or warning signs or symptoms.  We will proceed with scheduling of colonoscopy at this time.  Proceed with colonoscopy on propofol/MAC with Dr. Oneida Alar in the near future. The risks, benefits, and alternatives have been discussed in detail with the patient. They state understanding and desire to proceed.   The patient is currently on Klonopin, Seroquel, Zoloft.  No other anticoagulants, anxiolytics, chronic pain medications, antidepressants, antidiabetics, or iron supplements.  We will plan for the procedure on propofol/MAC to promote adequate sedation.

## 2019-06-21 NOTE — Patient Instructions (Signed)
Your health issues we discussed today were:   Need for colonoscopy: 1. We will schedule your colonoscopy for you 2. Further recommendations will follow your colonoscopy  Constipation: 1. I am giving you samples of Linzess 72 mcg. 2. Take this once a day, pursing in the morning on an empty stomach 3. Call us in 2 weeks and let us know if it is helping your bowel movements 4. Call us if you have any problems  GERD (reflux/heartburn): 1. I am restarting your Protonix medication to help with your heartburn. 2. I sent in Protonix 40 mg.  Take this twice a day: First in the morning when you awaken and 30 minutes before your last meal the day. 3. Causing him any worsening or severe symptoms  Overall I recommend:  1. Continue your other current medications 2. Return for follow-up in 4 months 3. Call us if you have any questions or concerns.   Because of recent events of COVID-19 ("Coronavirus"), follow CDC recommendations:  1. Wash your hand frequently 2. Avoid touching your face 3. Stay away from people who are sick 4. If you have symptoms such as fever, cough, shortness of breath then call your healthcare provider for further guidance 5. If you are sick, STAY AT HOME unless otherwise directed by your healthcare provider. 6. Follow directions from state and national officials regarding staying safe   At [facility] we value your feedback. You may receive a survey about your visit today. Please share your experience as we strive to create trusting relationships with our patients to provide genuine, compassionate, quality care.  We appreciate your understanding and patience as we review any laboratory studies, imaging, and other diagnostic tests that are ordered as we care for you. Our office policy is 5 business days for review of these results, and any emergent or urgent results are addressed in a timely manner for your best interest. If you do not hear from our office in  1 week, please contact us.   We also encourage the use of MyChart, which contains your medical information for your review as well. If you are not enrolled in this feature, an access code is on this after visit summary for your convenience. Thank you for allowing [place] to be involved in your care.  It was great to see you today!  I hope you have a Merry Christmas and Happy Holidays!!

## 2019-06-21 NOTE — Assessment & Plan Note (Signed)
The patient describes element of chronic constipation.  Somewhat worsening as of late.  She has a bowel movement about every 1 to 2 weeks.  Hard stools with straining.  Denies hematochezia or other red flag/warning signs or symptoms.  Has tried over-the-counter options without success.  At this point we will try her on Linzess 72 mcg daily.  We will give her samples and request progress report in 2 weeks.  We can dose adjust or switch agents depending on her clinical response.  I warned her of possible diarrhea which is typically self resolving.  Follow-up in 4 months.

## 2019-06-21 NOTE — Progress Notes (Signed)
Primary Care Physician:  Abran Richard, MD Primary Gastroenterologist:  Dr. Oneida Alar  Chief Complaint  Patient presents with  . Consult    TCS done years ago. PH polyps. FH colon cancer: MGM, MGF, PGM, PGF, Paternal uncle    HPI:   Sherry [name] is a 48 y.o. female who presents on referral from primary care to schedule colonoscopy.  Nurse/phone triage was deferred office visit due to medications likely necessitating augmented sedation.  Reviewed information associated with the referral including office visit dated 05/28/2019 which is yearly physical.  Noted due for colon cancer screening.  She turns 49 in January 2021.  No history of colonoscopy found in our system.  Today she states she's doing ok overall. She had a COVID-19 test because she was told by someone she'd need to have it done before her colonoscopy. She is NOT having COVID-19 symptoms. She had a colonoscopy in [facility] more than 10 years ago; found some polyps and cannot remember when she was supposed to have it redone. Has chronic constipation with a bowel movement every 1-2 weeks; tried MiraLAX, Ex-Lax not effective. Has abdominal discomfort which improves after defecation. Hard stools, straining. Prolonged sitting. Drinks a lot of water, moderate amount of fiber intake. Has also tried prunes, which only helped once. Denies hematochezia, melena, N/V, fever, chills, unintentional weight loss. Denies URI or flu-like symptoms. Denies loss of sense of taste or smell. Denies chest pain, dyspnea, dizziness, lightheadedness, syncope, near syncope. Denies any other upper or lower GI symptoms.  At the end of the visit she states she's had worsening GERD lately, frequent. Was previously on protonix which helped. TUMS not helping.  Past Medical History:  Diagnosis Date  . Depression   . High cholesterol   . Hypertension   . Lung collapse    left    Past Surgical History:  Procedure Laterality Date  . CHEST TUBE INSERTION    .  ENDOMETRIAL ABLATION      Current Outpatient Medications  Medication Sig Dispense Refill  . acetaminophen (TYLENOL) 650 MG CR tablet Take 650 mg by mouth every 8 (eight) hours as needed for pain.     . clonazePAM (KLONOPIN) 0.5 MG tablet Take 1 tablet (0.5 mg total) by mouth 2 (two) times daily as needed for up to 10 days for anxiety. 20 tablet 0  . hydrochlorothiazide (HYDRODIURIL) 25 MG tablet Take 25 mg by mouth every morning.     Marland Kitchen ibuprofen (ADVIL,MOTRIN) 600 MG tablet Take 1 tablet (600 mg total) by mouth every 6 (six) hours as needed. 30 tablet 0  . Magnesium 250 MG TABS Take 250 mg by mouth daily as needed (for cramping).     . metoprolol succinate (TOPROL-XL) 50 MG 24 hr tablet Take 50 mg by mouth every morning. Take with or immediately following a meal.    . QUEtiapine (SEROQUEL) 400 MG tablet Take 1 tablet (400 mg total) by mouth at bedtime. 10 tablet 0  . rosuvastatin (CRESTOR) 40 MG tablet Take 40 mg by mouth every morning.     . sertraline (ZOLOFT) 100 MG tablet Take 0.5 tablets (50 mg total) by mouth daily. 10 tablet 0   No current facility-administered medications for this visit.    Allergies as of 06/21/2019 - Review Complete 06/21/2019  Allergen Reaction Noted  . Dimenhydrinate Other (See Comments) and Swelling 10/29/2013    Family History  Problem Relation Age of Onset  . Colon polyps Mother   . Colon cancer  Maternal Grandmother   . Colon cancer Maternal Grandfather   . Colon cancer Paternal Grandmother   . Colon cancer Paternal Grandfather   . Colon cancer Paternal Uncle     Social History   Socioeconomic History  . Marital status: Divorced    Spouse name: Not on file  . Number of children: Not on file  . Years of education: Not on file  . Highest education level: Not on file  Occupational History  . Not on file  Tobacco Use  . Smoking status: Current Every Day Smoker    Packs/day: 0.50    Types: Cigarettes  . Smokeless tobacco: Never Used   Substance and Sexual Activity  . Alcohol use: Yes    Comment: occasionally   . Drug use: Never  . Sexual activity: Not on file  Other Topics Concern  . Not on file  Social History Narrative  . Not on file   Social Determinants of Health   Financial Resource Strain:   . Difficulty of Paying Living Expenses: Not on file  Food Insecurity:   . Worried About Charity fundraiser in the Last Year: Not on file  . Ran Out of Food in the Last Year: Not on file  Transportation Needs:   . Lack of Transportation (Medical): Not on file  . Lack of Transportation (Non-Medical): Not on file  Physical Activity:   . Days of Exercise per Week: Not on file  . Minutes of Exercise per Session: Not on file  Stress:   . Feeling of Stress : Not on file  Social Connections:   . Frequency of Communication with Friends and Family: Not on file  . Frequency of Social Gatherings with Friends and Family: Not on file  . Attends Religious Services: Not on file  . Active Member of Clubs or Organizations: Not on file  . Attends Archivist Meetings: Not on file  . Marital Status: Not on file  Intimate Partner Violence:   . Fear of Current or Ex-Partner: Not on file  . Emotionally Abused: Not on file  . Physically Abused: Not on file  . Sexually Abused: Not on file    Review of Systems: General: Negative for anorexia, weight loss, fever, chills, fatigue, weakness. ENT: Negative for hoarseness, difficulty swallowing. CV: Negative for chest pain, angina, palpitations, peripheral edema.  Respiratory: Negative for dyspnea at rest, cough, sputum, wheezing.  GI: See history of present illness. MS: Negative for joint pain, low back pain.  Derm: Negative for rash or itching.  Endo: Negative for unusual weight change.  Heme: Negative for bruising or bleeding. Allergy: Negative for rash or hives.    Physical Exam: BP 106/69   Pulse 84   Temp 97.6 F (36.4 C) (Oral)   Ht 5\' 5"  (1.651 m)   Wt 172  lb 9.6 oz (78.3 kg)   BMI 28.72 kg/m  General:   Alert and oriented. Pleasant and cooperative. Well-nourished and well-developed.  Eyes:  Without icterus, sclera clear and conjunctiva pink.  Ears:  Normal auditory acuity. Cardiovascular:  S1, S2 present without murmurs appreciated. Extremities without clubbing or edema. Respiratory:  Clear to auscultation bilaterally. No wheezes, rales, or rhonchi. No distress.  Gastrointestinal:  +BS, soft, non-tender and non-distended. No HSM noted. No guarding or rebound. No masses appreciated.  Rectal:  Deferred  Musculoskalatal:  Symmetrical without gross deformities. Skin:  Intact without significant lesions or rashes. Neurologic:  Alert and oriented x4;  grossly normal neurologically. Psych:  Alert and cooperative. Normal mood and affect. Heme/Lymph/Immune: No significant cervical adenopathy. No excessive bruising noted.    06/21/2019 9:04 AM   Disclaimer: This note was dictated with voice recognition software. Similar sounding words can inadvertently be transcribed and may not be corrected upon review.

## 2019-06-21 NOTE — Assessment & Plan Note (Signed)
Apparent chronic GERD.  She is having worsening symptoms recently of this.  She was previously on Protonix which helped.  Tums has not been helping as of late.  At this point I will restart her Protonix.  I will start her at 40 mg twice a day to try to get symptoms under control.  Can likely reduce to once a day at her follow-up visit.  Follow-up in 4 months.

## 2019-06-21 NOTE — Progress Notes (Signed)
Cc'ed to pcp °

## 2019-06-26 ENCOUNTER — Telehealth: Payer: Self-pay | Admitting: Gastroenterology

## 2019-06-26 NOTE — Telephone Encounter (Signed)
PATIENT CALLED AND SAID HER INSURANCE DID NOT COVER PROTONIX AND WOULD LIKE SOMETHING DIFFERENT CALLED INTO HER PHARMACY

## 2019-06-26 NOTE — Telephone Encounter (Signed)
Spoke with pts insurance company due to a PA being submitted last week. The PA was approved and pt will let her pharmacy know. PA transaction number is [id number]

## 2019-06-26 NOTE — Telephone Encounter (Signed)
Spoke with pt. Pt has tried and failed Protonix and Nexium. A PA will need to be submitted. Pt is aware that we will contact her when we have heard back from her insurance company.

## 2019-07-05 IMAGING — CT CT HEAD W/O CM
3 series · 16 of 46 positions shown, 19 images · non-contrast
Comparison: None.

CLINICAL DATA: Dizziness since yesterday. Nausea today with
vomiting.

EXAM:
CT HEAD WITHOUT CONTRAST
TECHNIQUE: Contiguous axial images were obtained from the base of the skull
through the vertex without intravenous contrast.

[Series 2: head trauma wo · axial · 0.39mm/px · z∈[+1644,+1764]mm · 10 of 29 slices shown, 13 images]
[im 3/29  brain]
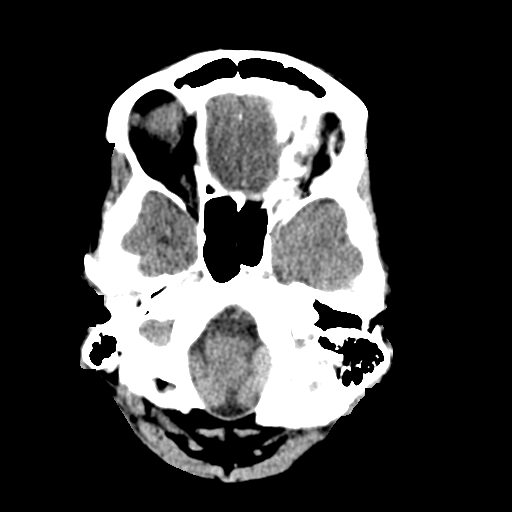
[im 3/29  bone]
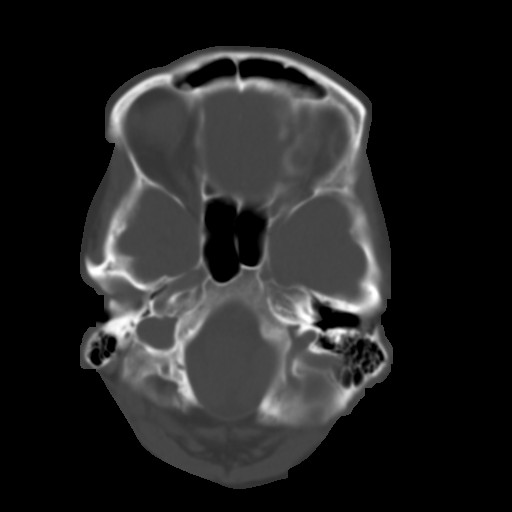
[im 6/29  brain]
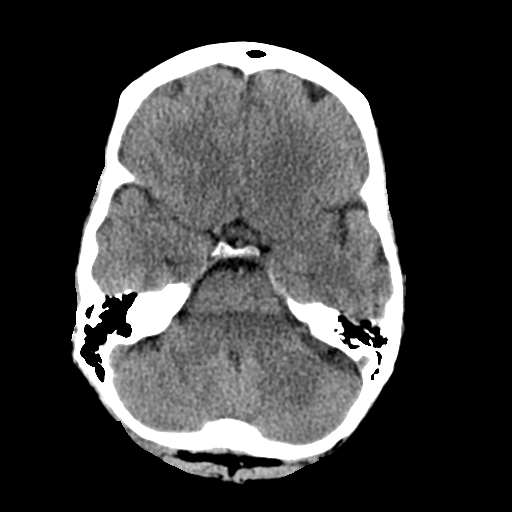
[im 8/29  brain]
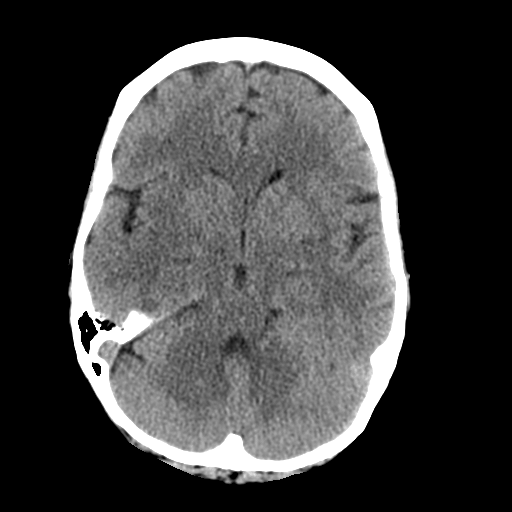
[im 11/29  brain]
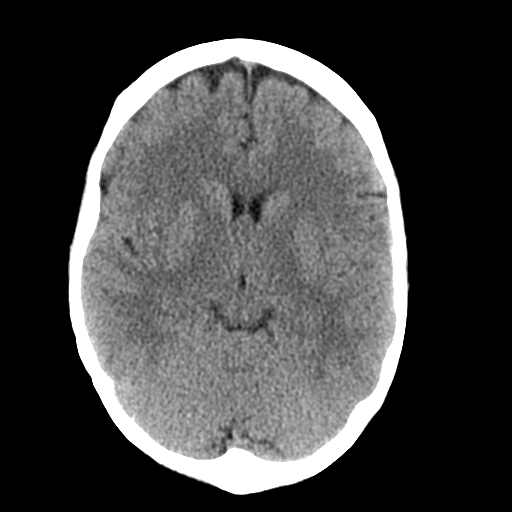
[im 14/29  brain]
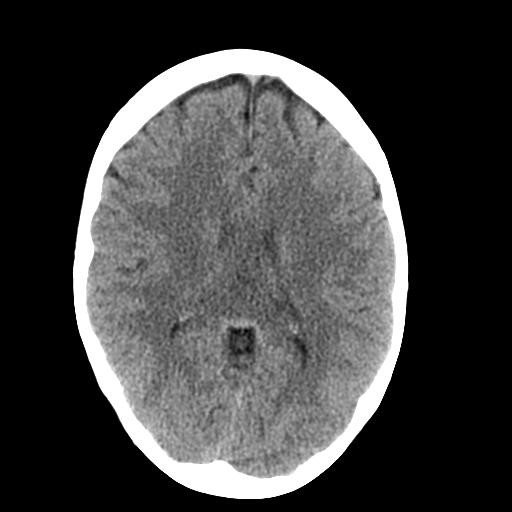
[im 14/29  bone]
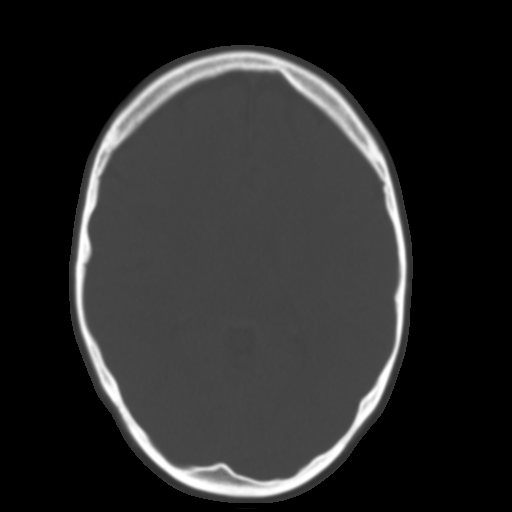
[im 16/29  brain]
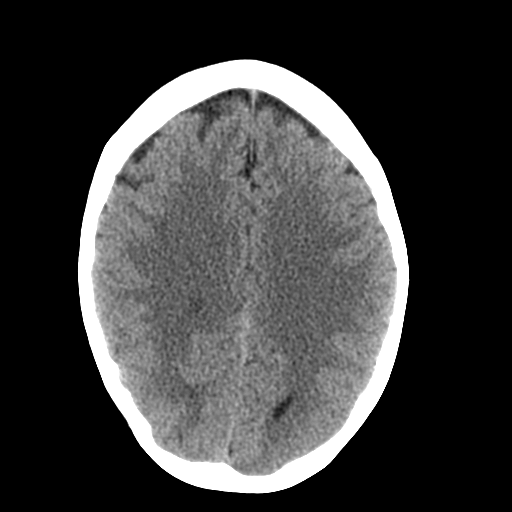
[im 19/29  brain]
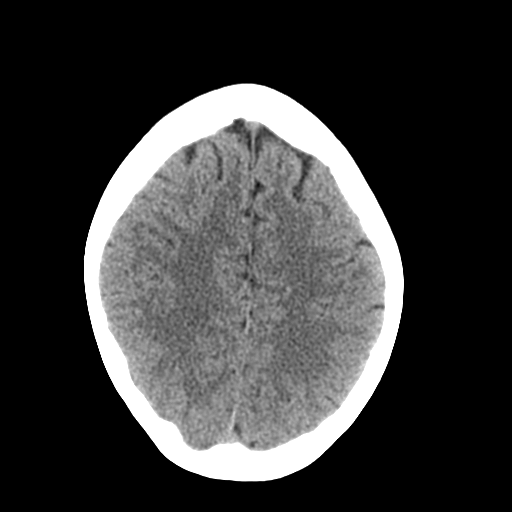
[im 22/29  brain]
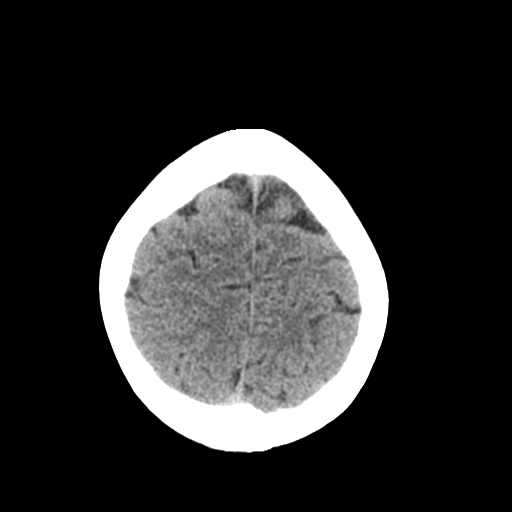
[im 24/29  brain]
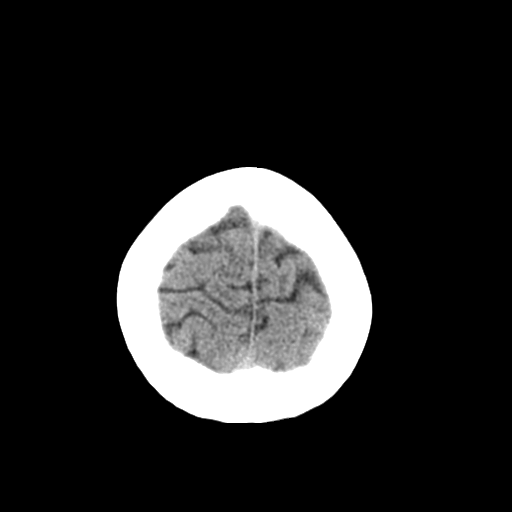
[im 24/29  bone]
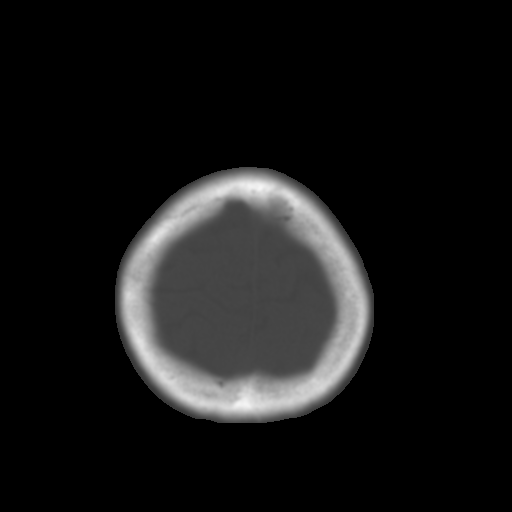
[im 27/29  brain]
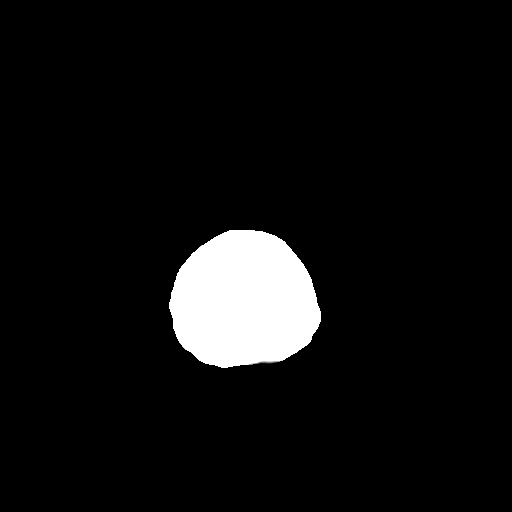

[Series 4: coronal soft tissue · coronal · 0.29mm/px · 3 of 66 slices shown]
[im 22/66  brain]
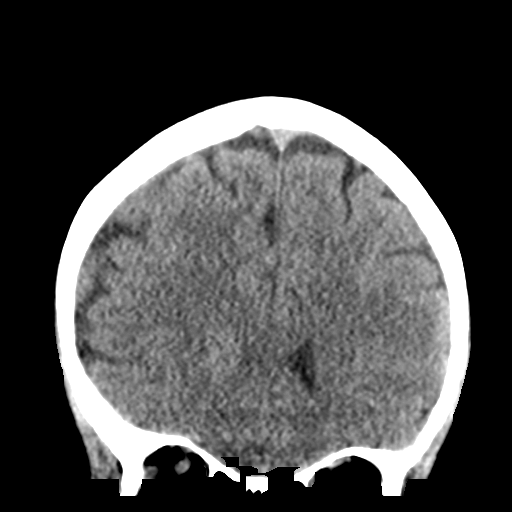
[im 29/66  brain]
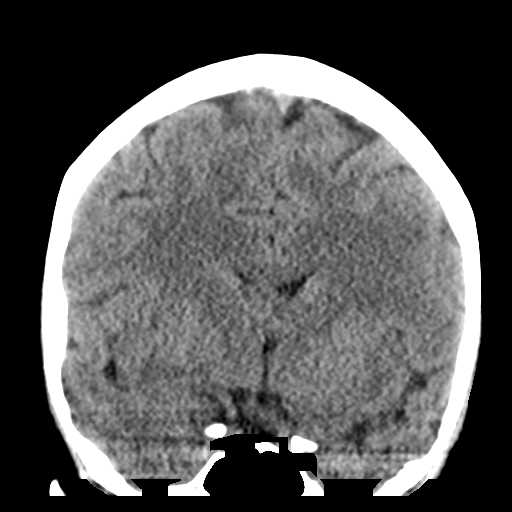
[im 37/66  brain]
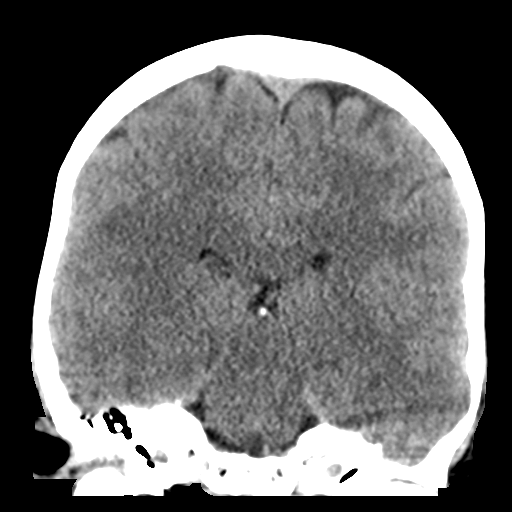

[Series 5: sagittal soft tissue · sagittal · 0.29mm/px · 3 of 47 slices shown]
[im 16/47  brain]
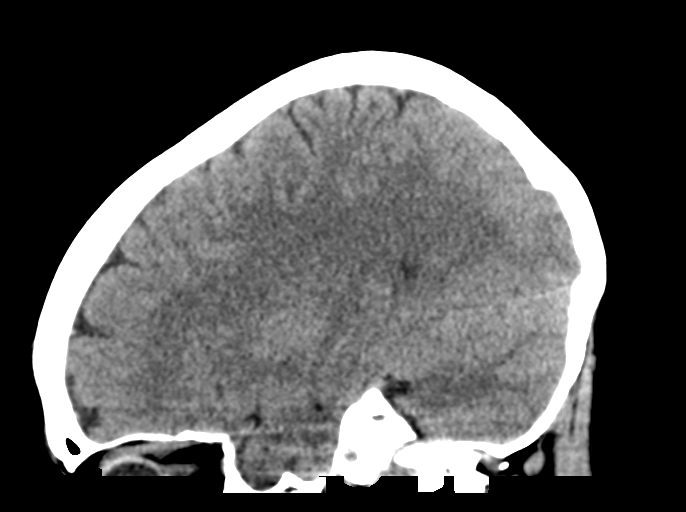
[im 24/47  brain]
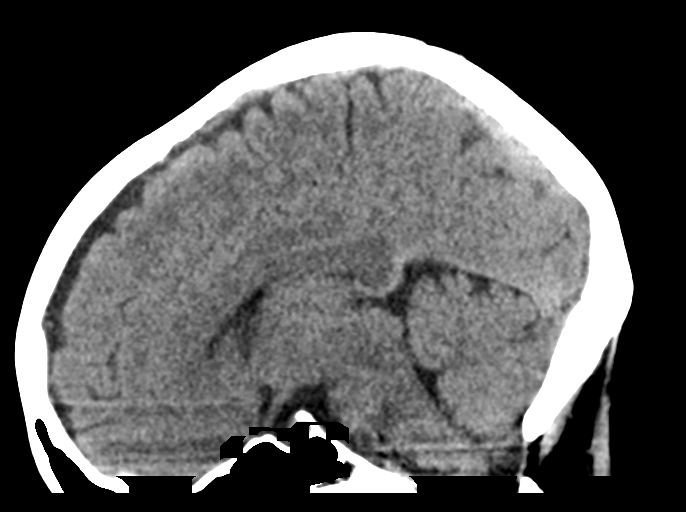
[im 31/47  brain]
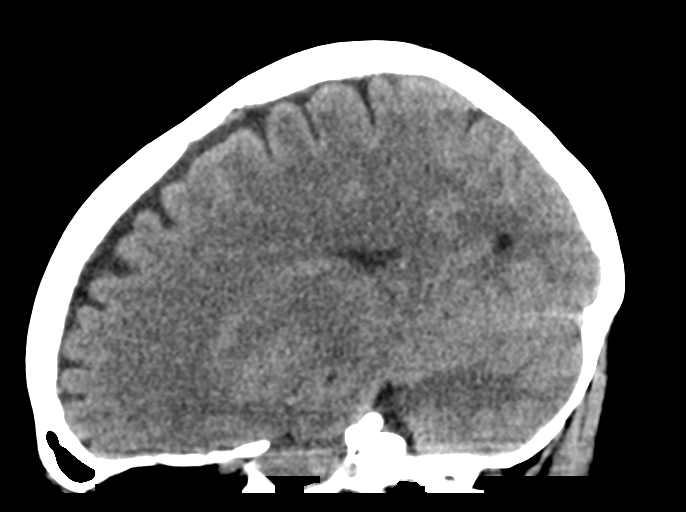

[16 of 46 positions shown; findings below may reference images not displayed]

FINDINGS: Brain: Normal appearing cerebral hemispheres and posterior fossa
structures. Normal size and position of the ventricles. No
intracranial hemorrhage, mass lesion or CT evidence of acute
infarction.

Vascular: No hyperdense vessel or unexpected calcification.

Skull: Normal. Negative for fracture or focal lesion.

Sinuses/Orbits: Unremarkable.

Other: None.
IMPRESSION: Normal examination.

## 2019-07-26 ENCOUNTER — Telehealth: Payer: Self-pay

## 2019-07-26 NOTE — Telephone Encounter (Signed)
LM for a return call in reference to her pantoprazole.  See separate note. It was approved.

## 2019-07-27 ENCOUNTER — Telehealth: Payer: Self-pay | Admitting: Gastroenterology

## 2019-07-27 DIAGNOSIS — K5900 Constipation, unspecified: Secondary | ICD-10-CM

## 2019-07-27 MED ORDER — LINACLOTIDE 72 MCG PO CAPS: 72.0000 ug | ORAL_CAPSULE | Freq: Every day | ORAL | 3 refills | Status: DC

## 2019-07-27 NOTE — Telephone Encounter (Signed)
Pt was returning a call from [place]

## 2019-07-27 NOTE — Telephone Encounter (Signed)
Rx sent to [facility] as requested

## 2019-07-27 NOTE — Telephone Encounter (Signed)
PT said the Linzess 72 mcg samples worked great and she would like an Rx sent to [facility] on [address] to [place], NP who saw her in the office.

## 2019-07-30 NOTE — Telephone Encounter (Signed)
Tried to call and VM not set up.  

## 2019-08-17 IMAGING — DX DG CHEST 2V
2 series · 2 of 2 positions shown · non-contrast
Comparison: Chest radiograph dated 06/21/2018

CLINICAL DATA: 40-year-old female with chest pain.

EXAM:
CHEST - 2 VIEW

[chest pa]
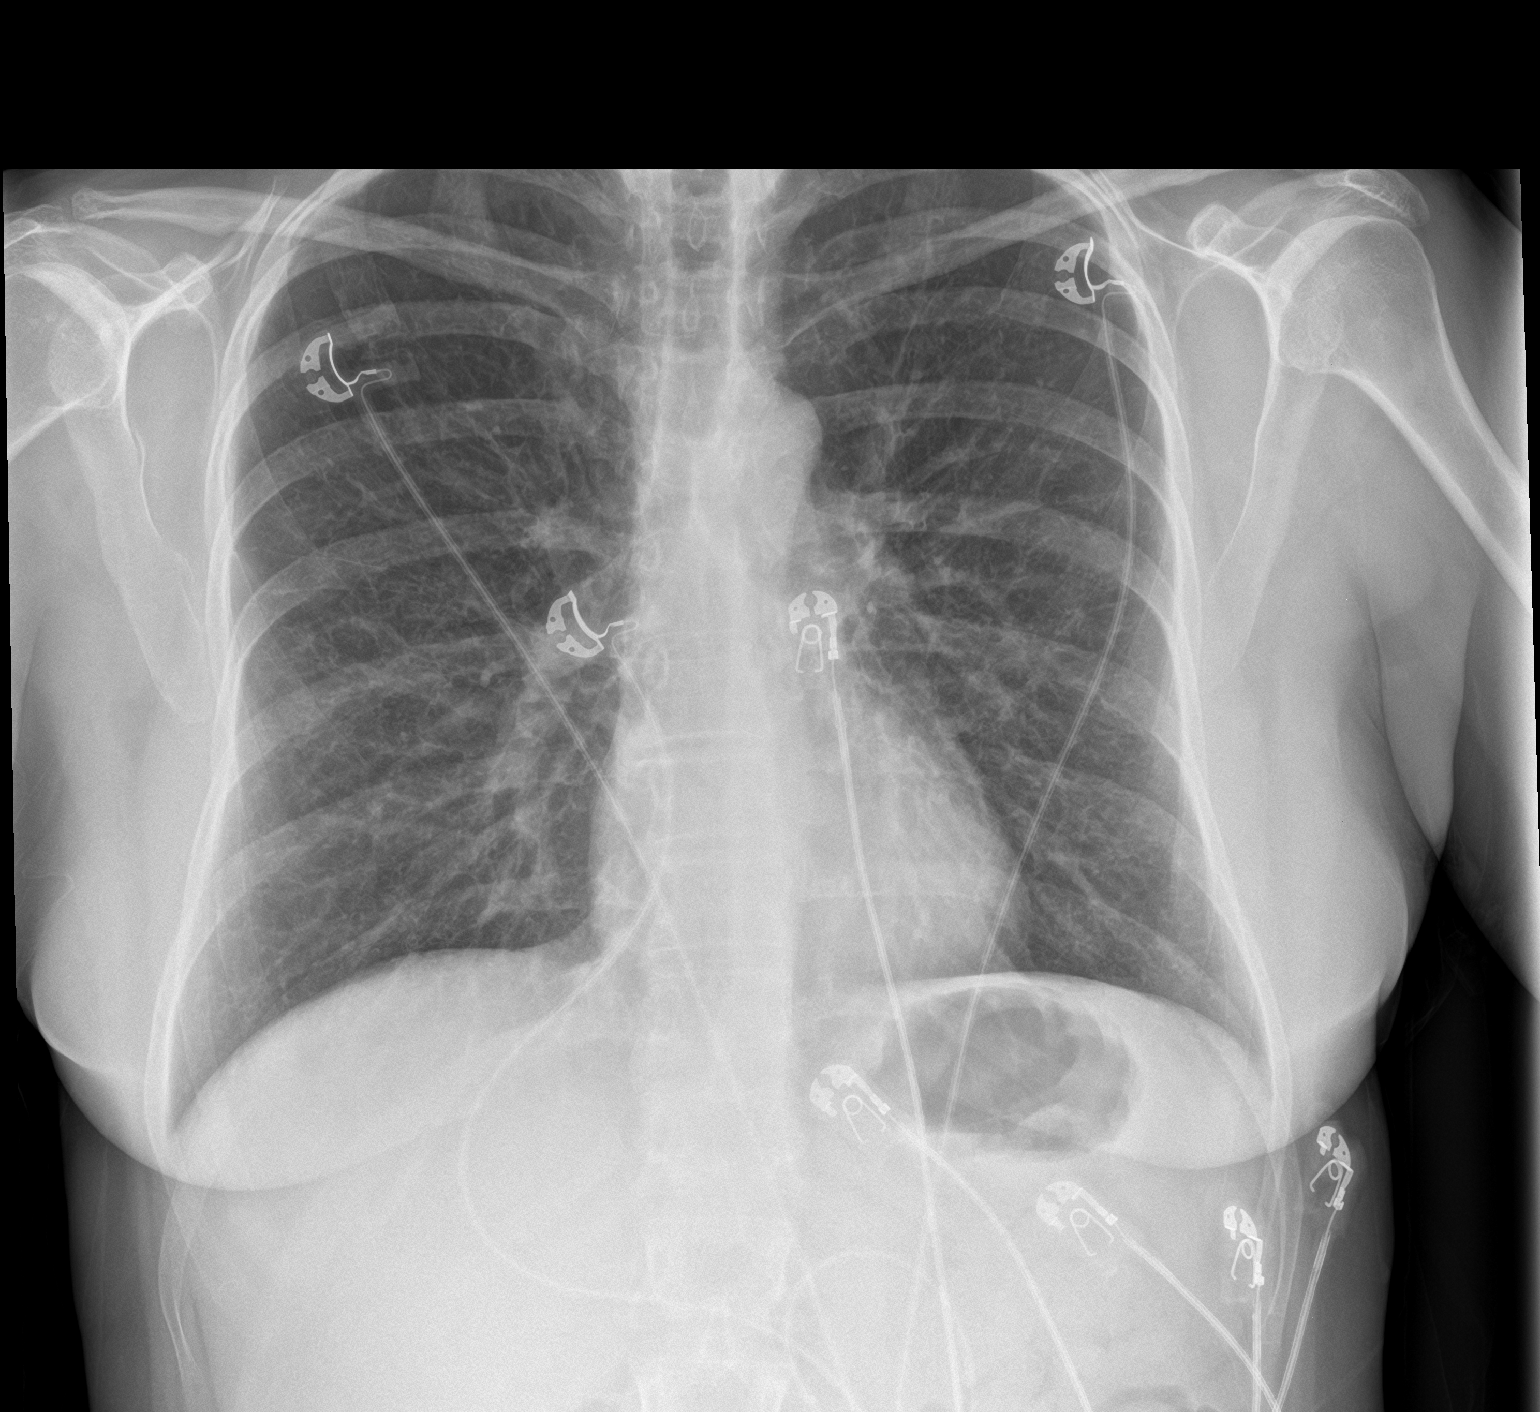

[chest lat]
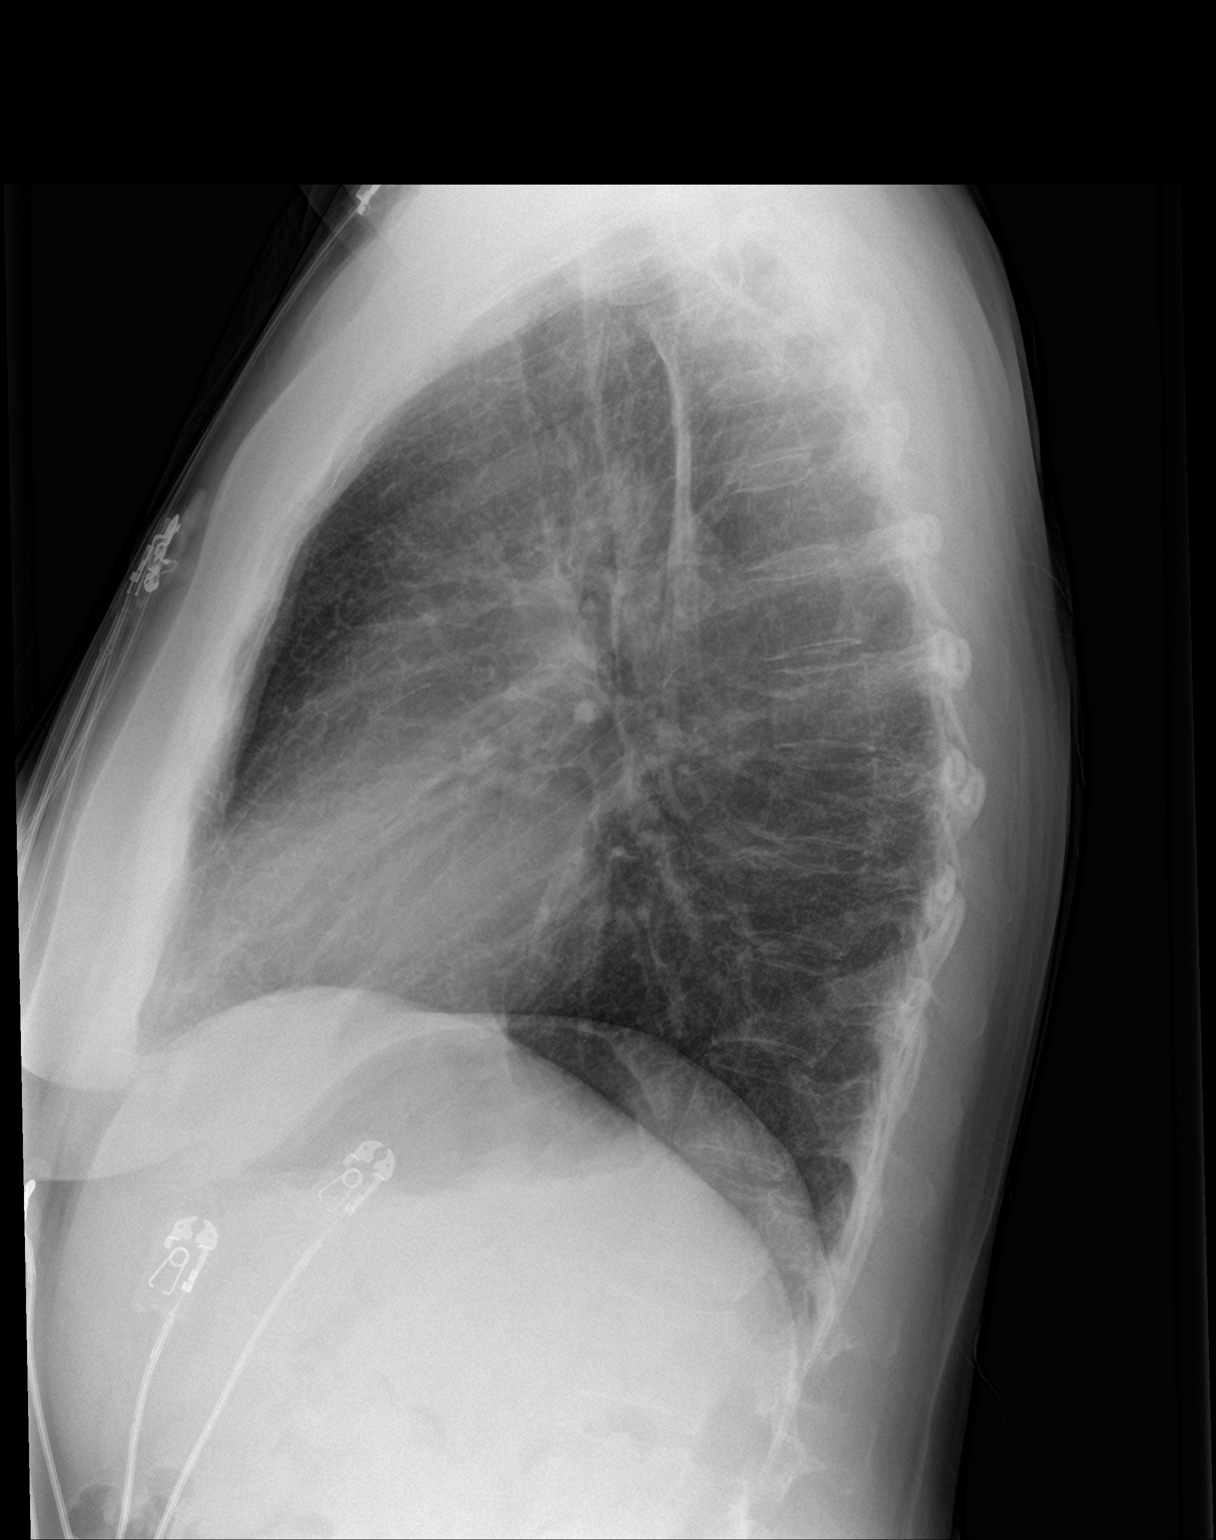

[2 of 2 positions shown; findings below may reference images not displayed]

FINDINGS: Mild diffuse interstitial coarsening and chronic bronchitic changes.
No focal consolidation, pleural effusion, or pneumothorax. Biapical
subpleural thickening/scarring. The cardiac silhouette is within
normal limits. There is osteopenia with degenerative changes of the
spine. No acute osseous pathology.
IMPRESSION: No active cardiopulmonary disease.

## 2019-08-19 ENCOUNTER — Emergency Department (HOSPITAL_COMMUNITY)
Admission: EM | Admit: 2019-08-19 | Discharge: 2019-08-19 | Disposition: A | Discharge status: Home / Self Care | Payer: BC Managed Care – PPO | Attending: Emergency Medicine | Admitting: Emergency Medicine

## 2019-08-19 ENCOUNTER — Emergency Department (HOSPITAL_COMMUNITY): Payer: BC Managed Care – PPO

## 2019-08-19 ENCOUNTER — Encounter (HOSPITAL_COMMUNITY): Payer: Self-pay

## 2019-08-19 ENCOUNTER — Other Ambulatory Visit: Payer: Self-pay

## 2019-08-19 DIAGNOSIS — R0789 Other chest pain: Secondary | ICD-10-CM | POA: Insufficient documentation

## 2019-08-19 DIAGNOSIS — W108XXA Fall (on) (from) other stairs and steps, initial encounter: Secondary | ICD-10-CM | POA: Insufficient documentation

## 2019-08-19 DIAGNOSIS — S7002XA Contusion of left hip, initial encounter: Secondary | ICD-10-CM | POA: Diagnosis not present

## 2019-08-19 DIAGNOSIS — W19XXXA Unspecified fall, initial encounter: Secondary | ICD-10-CM

## 2019-08-19 DIAGNOSIS — Y9389 Activity, other specified: Secondary | ICD-10-CM | POA: Insufficient documentation

## 2019-08-19 DIAGNOSIS — Y92008 Other place in unspecified non-institutional (private) residence as the place of occurrence of the external cause: Secondary | ICD-10-CM | POA: Diagnosis not present

## 2019-08-19 DIAGNOSIS — S79912A Unspecified injury of left hip, initial encounter: Secondary | ICD-10-CM | POA: Diagnosis present

## 2019-08-19 DIAGNOSIS — Z79899 Other long term (current) drug therapy: Secondary | ICD-10-CM | POA: Insufficient documentation

## 2019-08-19 DIAGNOSIS — Y999 Unspecified external cause status: Secondary | ICD-10-CM | POA: Insufficient documentation

## 2019-08-19 DIAGNOSIS — F17210 Nicotine dependence, cigarettes, uncomplicated: Secondary | ICD-10-CM | POA: Insufficient documentation

## 2019-08-19 DIAGNOSIS — I10 Essential (primary) hypertension: Secondary | ICD-10-CM | POA: Insufficient documentation

## 2019-08-19 MED ORDER — HYDROCODONE-ACETAMINOPHEN 5-325 MG PO TABS: 1.0000 | ORAL_TABLET | Freq: Four times a day (QID) | ORAL | 0 refills | Status: DC | PRN

## 2019-08-19 NOTE — Discharge Instructions (Addendum)
X-ray showed no evidence of any acute injury.  So symptoms consistent of soft tissue bruising and chest wall pain.  Can take Motrin or Naprosyn for the pain.  Also can take the hydrocodone provided for more severe pain.  Work note provided.  Follow-up with your doctor if not improving in 2 days.

## 2019-08-19 NOTE — ED Triage Notes (Signed)
Pt presents to ED after fall this morning down icy steps. Pt denies LOC or hitting head. Pt c/o right arm pain, left sided back pain, chest soreness.

## 2019-08-19 NOTE — ED Notes (Signed)
Leaving for work this am and slipped and fell down stairs   Now with pain under her R arm pit into her mid back  [pain with deep breaths  Also L hip pain with a knot there  She reports she had to call into work and will need a work excuse   Denies LOC or injury to head or neck

## 2019-08-19 NOTE — ED Provider Notes (Signed)
[facility] Provider Note   CSN: [id number] Arrival date & time: 08/19/19  1120     History Chief Complaint  Patient presents with  . Fall    Anysia Lehane is a 49 y.o. female.  Patient slipped this morning at about 6AM coming down her stairs stairs first steps slipped and and fell.  No loss of consciousness did not hit her head no neck pain.  Patient with complaint of pain in the right rib chest area under the axillary area.  Also with left iliac crest hip pain.  Patient was on her way to work when this happened.        Past Medical History:  Diagnosis Date  . Depression   . High cholesterol   . Hypertension   . Lung collapse    left    Patient Active Problem List   Diagnosis Date Noted  . Preventative health care 06/21/2019  . GERD (gastroesophageal reflux disease) 06/21/2019  . Constipation 06/21/2019    Past Surgical History:  Procedure Laterality Date  . CHEST TUBE INSERTION    . ENDOMETRIAL ABLATION       OB History   No obstetric history on file.     Family History  Problem Relation Age of Onset  . Colon polyps Mother   . Colon cancer Maternal Grandmother   . Colon cancer Maternal Grandfather   . Colon cancer Paternal Grandmother   . Colon cancer Paternal Grandfather   . Colon cancer Paternal Uncle     Social History   Tobacco Use  . Smoking status: Current Every Day Smoker    Packs/day: 0.50    Types: Cigarettes  . Smokeless tobacco: Never Used  Substance Use Topics  . Alcohol use: Yes    Comment: occasionally   . Drug use: Never    Home Medications Prior to Admission medications   Medication Sig Start Date End Date Taking? Authorizing Provider  acetaminophen (TYLENOL) 650 MG CR tablet Take 650 mg by mouth every 8 (eight) hours as needed for pain.    Yes [provider]  clonazePAM (KLONOPIN) 0.5 MG tablet Take 1 tablet (0.5 mg total) by mouth 2 (two) times daily as needed for up to 10 days for anxiety.  06/06/18 08/19/19 Yes Idol, Almyra Free, PA-C  hydrochlorothiazide (HYDRODIURIL) 25 MG tablet Take 25 mg by mouth every morning.  02/11/16  Yes [provider]  metoprolol succinate (TOPROL-XL) 50 MG 24 hr tablet Take 50 mg by mouth every morning. Take with or immediately following a meal.   Yes [provider]  pantoprazole (PROTONIX) 40 MG tablet Take 1 tablet (40 mg total) by mouth 2 (two) times daily before a meal. 06/21/19  Yes Carlis Stable, NP  QUEtiapine (SEROQUEL) 400 MG tablet Take 1 tablet (400 mg total) by mouth at bedtime. 06/06/18  Yes Idol, Almyra Free, PA-C  rosuvastatin (CRESTOR) 40 MG tablet Take 40 mg by mouth every morning.  02/10/16  Yes [provider]  sertraline (ZOLOFT) 100 MG tablet Take 0.5 tablets (50 mg total) by mouth daily. 06/06/18  Yes Idol, Almyra Free, PA-C  venlafaxine XR (EFFEXOR-XR) 150 MG 24 hr capsule Take 150 mg by mouth daily. 08/06/19  Yes [provider]  venlafaxine XR (EFFEXOR-XR) 75 MG 24 hr capsule Take 75 mg by mouth daily. 08/07/19  Yes [provider]  HYDROcodone-acetaminophen (NORCO/VICODIN) 5-325 MG tablet Take 1 tablet by mouth every 6 (six) hours as needed. 08/19/19   Fredia Sorrow, MD  linaclotide (LINZESS) 72 MCG capsule Take 1 capsule (72 mcg total) by mouth daily before breakfast. Patient not taking: Reported on 08/19/2019 07/27/19   Carlis Stable, NP    Allergies    Dimenhydrinate  Review of Systems   Review of Systems  Constitutional: Negative for chills and fever.  HENT: Negative for congestion, rhinorrhea and sore throat.   Eyes: Negative for visual disturbance.  Respiratory: Negative for cough and shortness of breath.   Cardiovascular: Positive for chest pain. Negative for leg swelling.  Gastrointestinal: Negative for abdominal pain, diarrhea, nausea and vomiting.  Genitourinary: Negative for dysuria.  Musculoskeletal: Negative for back pain and neck pain.  Skin: Negative for rash.  Neurological: Negative for  dizziness, light-headedness and headaches.  Hematological: Does not bruise/bleed easily.  Psychiatric/Behavioral: Negative for confusion.    Physical Exam Updated Vital Signs BP 102/65 (BP Location: Left Arm)   Pulse 79   Temp 97.9 F (36.6 C) (Oral)   Ht 1.651 m (5\' 5" )   Wt 70.3 kg   SpO2 98%   BMI 25.79 kg/m   Physical Exam Vitals and nursing note reviewed.  Constitutional:      General: She is not in acute distress.    Appearance: Normal appearance. She is well-developed.  HENT:     Head: Normocephalic and atraumatic.  Eyes:     Extraocular Movements: Extraocular movements intact.     Conjunctiva/sclera: Conjunctivae normal.     Pupils: Pupils are equal, round, and reactive to light.  Cardiovascular:     Rate and Rhythm: Normal rate and regular rhythm.     Heart sounds: No murmur.  Pulmonary:     Effort: Pulmonary effort is normal. No respiratory distress.     Breath sounds: Normal breath sounds.  Chest:     Chest wall: Tenderness present.  Abdominal:     Palpations: Abdomen is soft.     Tenderness: There is no abdominal tenderness.  Musculoskeletal:        General: Tenderness present.     Cervical back: Normal range of motion and neck supple.     Comments: Tenderness to palpation right lateral upper rib area.  Under the axillary area.  Also some tenderness on the left iliac crest area.  Neurovascularly intact distally.  Skin:    General: Skin is warm and dry.     Capillary Refill: Capillary refill takes less than 2 seconds.  Neurological:     General: No focal deficit present.     Mental Status: She is alert and oriented to person, place, and time.     ED Results / Procedures / Treatments   Labs (all labs ordered are listed, but only abnormal results are displayed) Labs Reviewed - No data to display  EKG None  Radiology DG Ribs Unilateral W/Chest Right  Result Date: 08/19/2019 CLINICAL DATA:  49 year old who slipped and fell on her icy porch earlier  today, complaining of LEFT hip pain, POSTERIOR RIGHT shoulder pain, and POSTERIOR RIGHT rib pain. Initial encounter. EXAM: RIGHT RIBS AND CHEST - 3+ VIEW COMPARISON:  Chest x-ray 12/02/2018 and earlier. FINDINGS: No fractures identified involving the RIGHT ribs. No intrinsic osseous abnormalities. Cardiomediastinal silhouette unremarkable and unchanged. Prominent bronchovascular markings diffusely a mild central peribronchial thickening, unchanged. Lungs otherwise clear. No localized airspace consolidation. No pleural effusions. No pneumothorax. Normal pulmonary vascularity. IMPRESSION: 1. No RIGHT rib fractures identified. 2. Stable mild changes of chronic bronchitis and/or asthma. No acute cardiopulmonary disease. Electronically Signed   By: Marcello Moores  Lawrence M.D.   On: 08/19/2019 12:47   DG Shoulder Right  Result Date: 08/19/2019 CLINICAL DATA:  49 year old who fell on her icy porch earlier today and complains of RIGHT shoulder pain, RIGHT POSTERIOR rib pain and LEFT hip pain. Initial encounter. EXAM: RIGHT SHOULDER - 2+ VIEW COMPARISON:  None. FINDINGS: No evidence of acute, subacute or healed fracture. Glenohumeral joint anatomically aligned with well-preserved joint space. Subacromial space well-preserved. Acromioclavicular joint anatomically aligned without significant degenerative changes. Well preserved bone mineral density. No intrinsic osseous abnormality. IMPRESSION: Normal examination. Electronically Signed   By: Evangeline Dakin M.D.   On: 08/19/2019 12:48   DG Hip Unilat W or Wo Pelvis 2-3 Views Left  Result Date: 08/19/2019 CLINICAL DATA:  49 year old who fell on her icy porch earlier today and complains of RIGHT shoulder pain, RIGHT POSTERIOR rib pain and LEFT hip pain. Initial encounter. EXAM: DG HIP (WITH OR WITHOUT PELVIS) 2-3V LEFT COMPARISON:  None. FINDINGS: No evidence of acute fracture or dislocation. Well-preserved joint space. Benign bone island in the femoral neck. No significant  intrinsic osseous abnormality. Well-preserved bone mineral density. Included AP pelvis demonstrates a normal-appearing contralateral RIGHT hip. Sacroiliac joints and symphysis pubis anatomically aligned without diastasis or significant degenerative changes. IMPRESSION: No acute or significant osseous abnormality. Electronically Signed   By: Evangeline Dakin M.D.   On: 08/19/2019 12:51    Procedures Procedures (including critical care time)  Medications Ordered in ED Medications - No data to display  ED Course  I have reviewed the triage vital signs and the nursing notes.  Pertinent labs & imaging results that were available during my care of the patient were reviewed by me and considered in my medical decision making (see chart for details).    MDM Rules/Calculators/A&P                      X-rays of right shoulder chest and right rib series and left hip and pelvis without any acute abnormalities.  Symptoms consistent with soft tissue injury.  Patient nontoxic no acute distress.  Will provide with a work note to be out of work till Wednesday.  And a short course of hydrocodone.  Patient can also take anti-inflammatories like Motrin  or Naprosyn   Final Clinical Impression(s) / ED Diagnoses Final diagnoses:  Fall, initial encounter  Chest wall pain  Contusion of left hip, initial encounter    Rx / DC Orders ED Discharge Orders         Ordered    HYDROcodone-acetaminophen (NORCO/VICODIN) 5-325 MG tablet  Every 6 hours PRN     08/19/19 1258           Fredia Sorrow, MD 08/19/19 1304

## 2019-09-03 ENCOUNTER — Telehealth: Payer: Self-pay | Admitting: Gastroenterology

## 2019-09-03 ENCOUNTER — Other Ambulatory Visit: Payer: Self-pay

## 2019-09-03 MED ORDER — PEG 3350-KCL-NA BICARB-NACL 420 G PO SOLR: 4000.0000 mL | ORAL | 0 refills | Status: DC

## 2019-09-03 NOTE — Telephone Encounter (Signed)
Pt got her procedure dates mixed up and mixed her prep last night and realized her procedure wasn't until 10/02/2019. She needs another prep sent to [facility] on [address]. Also, said linzess wasn't covered by her insurance and asked for either samples or something else cheaper called in. Please advise. [phone]

## 2019-09-03 NOTE — Telephone Encounter (Signed)
Resent rx for TCS prep.

## 2019-09-03 NOTE — Telephone Encounter (Signed)
LMOM to call.

## 2019-09-04 NOTE — Telephone Encounter (Signed)
Called and [facility] for a return call from pt.  I called [facility] and they will fax a PA request to me.

## 2019-09-05 ENCOUNTER — Telehealth: Payer: Self-pay

## 2019-09-05 DIAGNOSIS — K5900 Constipation, unspecified: Secondary | ICD-10-CM

## 2019-09-05 NOTE — Telephone Encounter (Signed)
Received fax from [facility]), Linzess 77mcg is not covered by plan. Per rx benefit plan alternative requested.

## 2019-09-11 MED ORDER — LUBIPROSTONE 24 MCG PO CAPS: 24.0000 ug | ORAL_CAPSULE | Freq: Two times a day (BID) | ORAL | 3 refills | Status: DC

## 2019-09-11 NOTE — Telephone Encounter (Addendum)
We can try Amitiza 24 mcg. Rx sent. Let me know if any further problems.  Take bid WITH food

## 2019-09-11 NOTE — Addendum Note (Signed)
Addended by: Gordy Levan, Karthikeya Funke A on: 09/11/2019 02:19 PM   Modules accepted: Orders

## 2019-09-11 NOTE — Telephone Encounter (Signed)
Tried to call pt, name on VM wasn't pt, LMOVM for return call. Called emergency contact (Tim, also listed on DPR), informed him of rx. He will inform pt.

## 2019-09-26 NOTE — Patient Instructions (Signed)
Your procedure is scheduled on: 10/02/2019  Report to [facility] at    8:00 AM.  Call this number if you have problems the morning of surgery: [phone]   Remember:              Follow Directions on the letter you received from Your Physician's office regarding the Bowel Prep              No Smoking the day of Procedure :   Take these medicines the morning of surgery with A SIP OF WATER: Metoprolol, pantoprazole, zoloft, effxor, and klonopin   Do not wear jewelry, make-up or nail polish.    Do not bring valuables to the hospital.  Contacts, dentures or bridgework may not be worn into surgery.  .   Patients discharged the day of surgery will not be allowed to drive home.     Colonoscopy, Adult, Care After This sheet gives you information about how to care for yourself after your procedure. Your health care provider may also give you more specific instructions. If you have problems or questions, contact your health care provider. What can I expect after the procedure? After the procedure, it is common to have:  A small amount of blood in your stool for 24 hours after the procedure.  Some gas.  Mild abdominal cramping or bloating.  Follow these instructions at home: General instructions   For the first 24 hours after the procedure: ? Do not drive or use machinery. ? Do not sign important documents. ? Do not drink alcohol. ? Do your regular daily activities at a slower pace than normal. ? Eat soft, easy-to-digest foods. ? Rest often.  Take over-the-counter or prescription medicines only as told by your health care provider.  It is up to you to get the results of your procedure. Ask your health care provider, or the department performing the procedure, when your results will be ready. Relieving cramping and bloating  Try walking around when you have cramps or feel bloated.  Apply heat to your abdomen as told by your health care provider. Use a heat source that your  health care provider recommends, such as a moist heat pack or a heating pad. ? Place a towel between your skin and the heat source. ? Leave the heat on for 20-30 minutes. ? Remove the heat if your skin turns bright red. This is especially important if you are unable to feel pain, heat, or cold. You may have a greater risk of getting burned. Eating and drinking  Drink enough fluid to keep your urine clear or pale yellow.  Resume your normal diet as instructed by your health care provider. Avoid heavy or fried foods that are hard to digest.  Avoid drinking alcohol for as long as instructed by your health care provider. Contact a health care provider if:  You have blood in your stool 2-3 days after the procedure. Get help right away if:  You have more than a small spotting of blood in your stool.  You pass large blood clots in your stool.  Your abdomen is swollen.  You have nausea or vomiting.  You have a fever.  You have increasing abdominal pain that is not relieved with medicine. This information is not intended to replace advice given to you by your health care provider. Make sure you discuss any questions you have with your health care provider. Document Released: 02/10/2004 Document Revised: 03/22/2016 Document Reviewed: 09/09/2015 Elsevier Interactive Patient Education  2018 [place].

## 2019-10-01 ENCOUNTER — Other Ambulatory Visit: Payer: Self-pay

## 2019-10-01 ENCOUNTER — Other Ambulatory Visit (HOSPITAL_COMMUNITY)
Admission: RE | Admit: 2019-10-01 | Discharge: 2019-10-01 | Disposition: A | Discharge status: Home / Self Care | Payer: BC Managed Care – PPO | Source: Ambulatory Visit | Attending: Gastroenterology | Admitting: Gastroenterology

## 2019-10-01 ENCOUNTER — Encounter (HOSPITAL_COMMUNITY)
Admission: RE | Admit: 2019-10-01 | Discharge: 2019-10-01 | Disposition: A | Discharge status: Home / Self Care | Payer: BC Managed Care – PPO | Source: Ambulatory Visit | Attending: Gastroenterology | Admitting: Gastroenterology

## 2019-10-01 ENCOUNTER — Encounter (HOSPITAL_COMMUNITY): Payer: Self-pay

## 2019-10-01 ENCOUNTER — Telehealth: Payer: Self-pay

## 2019-10-01 NOTE — Telephone Encounter (Signed)
Pre-op and COVID test 12/21/19. Letter mailed with procedure instructions.

## 2019-10-01 NOTE — Telephone Encounter (Signed)
Endo scheduler called office, pt had left VM that she was sick and not able to make it to pre-op appt/COVID test today. Needs to reschedule TCS w/Prop w/[facility] that was scheduled for tomorrow.  Called pt, she is sick. She is aware [facility] will do TCS and is agreeable. TCS w/Prop w/RMR scheduled for 12/24/19 at 9:00am. New orders entered.

## 2019-10-02 ENCOUNTER — Encounter (HOSPITAL_COMMUNITY): Admission: RE | Payer: Self-pay | Source: Home / Self Care

## 2019-10-02 ENCOUNTER — Ambulatory Visit (HOSPITAL_COMMUNITY)
Admission: RE | Admit: 2019-10-02 | Payer: BC Managed Care – PPO | Source: Home / Self Care | Admitting: Gastroenterology

## 2019-10-02 SURGERY — COLONOSCOPY WITH PROPOFOL
Anesthesia: Monitor Anesthesia Care

## 2019-10-04 ENCOUNTER — Other Ambulatory Visit (HOSPITAL_COMMUNITY): Payer: Self-pay | Admitting: Internal Medicine

## 2019-10-04 DIAGNOSIS — F17200 Nicotine dependence, unspecified, uncomplicated: Secondary | ICD-10-CM

## 2019-10-04 DIAGNOSIS — R935 Abnormal findings on diagnostic imaging of other abdominal regions, including retroperitoneum: Secondary | ICD-10-CM

## 2019-10-19 ENCOUNTER — Ambulatory Visit (HOSPITAL_COMMUNITY): Payer: BC Managed Care – PPO

## 2019-10-19 ENCOUNTER — Encounter (HOSPITAL_COMMUNITY): Payer: Self-pay

## 2019-10-21 ENCOUNTER — Other Ambulatory Visit: Payer: Self-pay | Admitting: Nurse Practitioner

## 2019-10-23 ENCOUNTER — Telehealth: Payer: Self-pay | Admitting: Gastroenterology

## 2019-10-23 ENCOUNTER — Ambulatory Visit: Payer: BC Managed Care – PPO | Admitting: Nurse Practitioner

## 2019-10-23 ENCOUNTER — Encounter: Payer: Self-pay | Admitting: Gastroenterology

## 2019-10-23 NOTE — Progress Notes (Deleted)
Referring Provider: Abran Richard, MD Primary Care Physician:  Abran Richard, MD Primary GI:  Dr. Oneida Alar  No chief complaint on file.   HPI:   Sherry [name] is a 49 y.o. female who presents for follow-up from constipation and GERD.  The patient was last in our office 06/21/2019 for preventative health care, GERD, constipation.  At that time she noted her last colonoscopy was 10 years ago in [place] and thinks they found polyps but cannot remember when she was first ever repeat.  Noted chronic constipation with a bowel movement every 1 to 2 weeks previously tried and failed MiraLAX, Ex-Lax.  Abdominal discomfort improves after defecation with noted hard stools and straining.  Does have prolonged sitting.  Adequate water and fiber intake.  No other overt GI complaints.  At the end of the visit she also noted worsening GERD which is frequent and previously on Protonix which helped, currently Tums not helping.  Recommended update colonoscopy, Linzess 72 mcg with samples and progress report in 2 weeks, Protonix 40 mg twice daily, follow-up in 4 months.  Initially her pantoprazole was denied by insurance but her prior authorization was approved.  She called our office 07/27/2019 indicating Linzess 72 mcg working well and prescription was sent to her pharmacy.  However, Linzess is not covered by insurance.  Recommended Amitiza 24 mcg twice daily with food.  Colonoscopy initially scheduled for 10/02/2019 but the patient had to cancel because she became ill.  It was rescheduled for 12/24/2019 and is still upcoming.  Today she states   Past Medical History:  Diagnosis Date  . Depression   . High cholesterol   . Hypertension   . Lung collapse    left    Past Surgical History:  Procedure Laterality Date  . CHEST TUBE INSERTION    . ENDOMETRIAL ABLATION      Current Outpatient Medications  Medication Sig Dispense Refill  . acetaminophen (TYLENOL) 650 MG CR tablet Take 650 mg by mouth  every 8 (eight) hours as needed for pain.     . clonazePAM (KLONOPIN) 0.5 MG tablet Take 1 tablet (0.5 mg total) by mouth 2 (two) times daily as needed for up to 10 days for anxiety. 20 tablet 0  . hydrochlorothiazide (HYDRODIURIL) 25 MG tablet Take 25 mg by mouth every morning.     Marland Kitchen HYDROcodone-acetaminophen (NORCO/VICODIN) 5-325 MG tablet Take 1 tablet by mouth every 6 (six) hours as needed. 10 tablet 0  . linaclotide (LINZESS) 72 MCG capsule Take 1 capsule (72 mcg total) by mouth daily before breakfast. (Patient not taking: Reported on 08/19/2019) 30 capsule 3  . lubiprostone (AMITIZA) 24 MCG capsule Take 1 capsule (24 mcg total) by mouth 2 (two) times daily with a meal. 60 capsule 3  . metoprolol succinate (TOPROL-XL) 50 MG 24 hr tablet Take 50 mg by mouth every morning. Take with or immediately following a meal.    . pantoprazole (PROTONIX) 40 MG tablet Take 1 tablet (40 mg total) by mouth 2 (two) times daily before a meal. 60 tablet 3  . polyethylene glycol-electrolytes (TRILYTE) 420 g solution Take 4,000 mLs by mouth as directed. 4000 mL 0  . QUEtiapine (SEROQUEL) 400 MG tablet Take 1 tablet (400 mg total) by mouth at bedtime. 10 tablet 0  . rosuvastatin (CRESTOR) 40 MG tablet Take 40 mg by mouth every morning.     . sertraline (ZOLOFT) 100 MG tablet Take 0.5 tablets (50 mg total) by mouth daily. 10 tablet 0  .  venlafaxine XR (EFFEXOR-XR) 150 MG 24 hr capsule Take 150 mg by mouth daily.    Marland Kitchen venlafaxine XR (EFFEXOR-XR) 75 MG 24 hr capsule Take 75 mg by mouth daily.     No current facility-administered medications for this visit.    Allergies as of 10/23/2019 - Review Complete 08/19/2019  Allergen Reaction Noted  . Dimenhydrinate Other (See Comments) and Swelling 10/29/2013    Family History  Problem Relation Age of Onset  . Colon polyps Mother   . Colon cancer Maternal Grandmother   . Colon cancer Maternal Grandfather   . Colon cancer Paternal Grandmother   . Colon cancer Paternal  Grandfather   . Colon cancer Paternal Uncle     Social History   Socioeconomic History  . Marital status: Divorced    Spouse name: Not on file  . Number of children: Not on file  . Years of education: Not on file  . Highest education level: Not on file  Occupational History  . Not on file  Tobacco Use  . Smoking status: Current Every Day Smoker    Packs/day: 0.50    Types: Cigarettes  . Smokeless tobacco: Never Used  Substance and Sexual Activity  . Alcohol use: Yes    Comment: occasionally   . Drug use: Never  . Sexual activity: Not on file  Other Topics Concern  . Not on file  Social History Narrative  . Not on file   Social Determinants of Health   Financial Resource Strain:   . Difficulty of Paying Living Expenses:   Food Insecurity:   . Worried About Charity fundraiser in the Last Year:   . Arboriculturist in the Last Year:   Transportation Needs:   . Film/video editor (Medical):   Marland Kitchen Lack of Transportation (Non-Medical):   Physical Activity:   . Days of Exercise per Week:   . Minutes of Exercise per Session:   Stress:   . Feeling of Stress :   Social Connections:   . Frequency of Communication with Friends and Family:   . Frequency of Social Gatherings with Friends and Family:   . Attends Religious Services:   . Active Member of Clubs or Organizations:   . Attends Archivist Meetings:   Marland Kitchen Marital Status:     Subjective: ROS   Objective: There were no vitals taken for this visit. Physical Exam    10/23/2019 9:52 AM   Disclaimer: This note was dictated with voice recognition software. Similar sounding words can inadvertently be transcribed and may not be corrected upon review.

## 2019-10-23 NOTE — Telephone Encounter (Signed)
Patient was a no show and letter sent  °

## 2019-10-23 NOTE — Telephone Encounter (Signed)
Limited refills. Patient no showed today. Will need to reschedule ov within the next three months.

## 2019-10-24 ENCOUNTER — Encounter: Payer: Self-pay | Admitting: Gastroenterology

## 2019-10-24 NOTE — Telephone Encounter (Signed)
Sent letter stating she needed to call office for appointment

## 2019-11-20 ENCOUNTER — Other Ambulatory Visit: Payer: Self-pay | Admitting: Gastroenterology

## 2019-12-13 ENCOUNTER — Telehealth: Payer: Self-pay | Admitting: Internal Medicine

## 2019-12-13 NOTE — Telephone Encounter (Signed)
Called pt, she wants to cancel TCS d/t no insurance and unable to afford procedure. She is aware she can apply for [facility] assistance. LMOVM for endo scheduler to cancel procedure.  FYI to EG.

## 2019-12-13 NOTE — Telephone Encounter (Signed)
Samples of linzess 48mcg left at front desk

## 2019-12-13 NOTE — Telephone Encounter (Signed)
PATIENT CALLED TO LET us KNOW SHE IS SCHEDULED FOR UPCOMING PROCEDURE AND HAS NO LONGER HAS INSURANCE....Marland KitchenDID NOT SAY SHE WANTED TO CANCEL?  SHE ALSO NEEDS MORE LIN

## 2019-12-13 NOTE — Telephone Encounter (Signed)
Patient called wanting more linzess samples

## 2019-12-16 IMAGING — DX CHEST - 2 VIEW
2 series · 2 of 2 positions shown · non-contrast
Comparison: August 03, 2018 and June 21, 2018

CLINICAL DATA: Chest pain

EXAM:
CHEST - 2 VIEW

[chest pa]
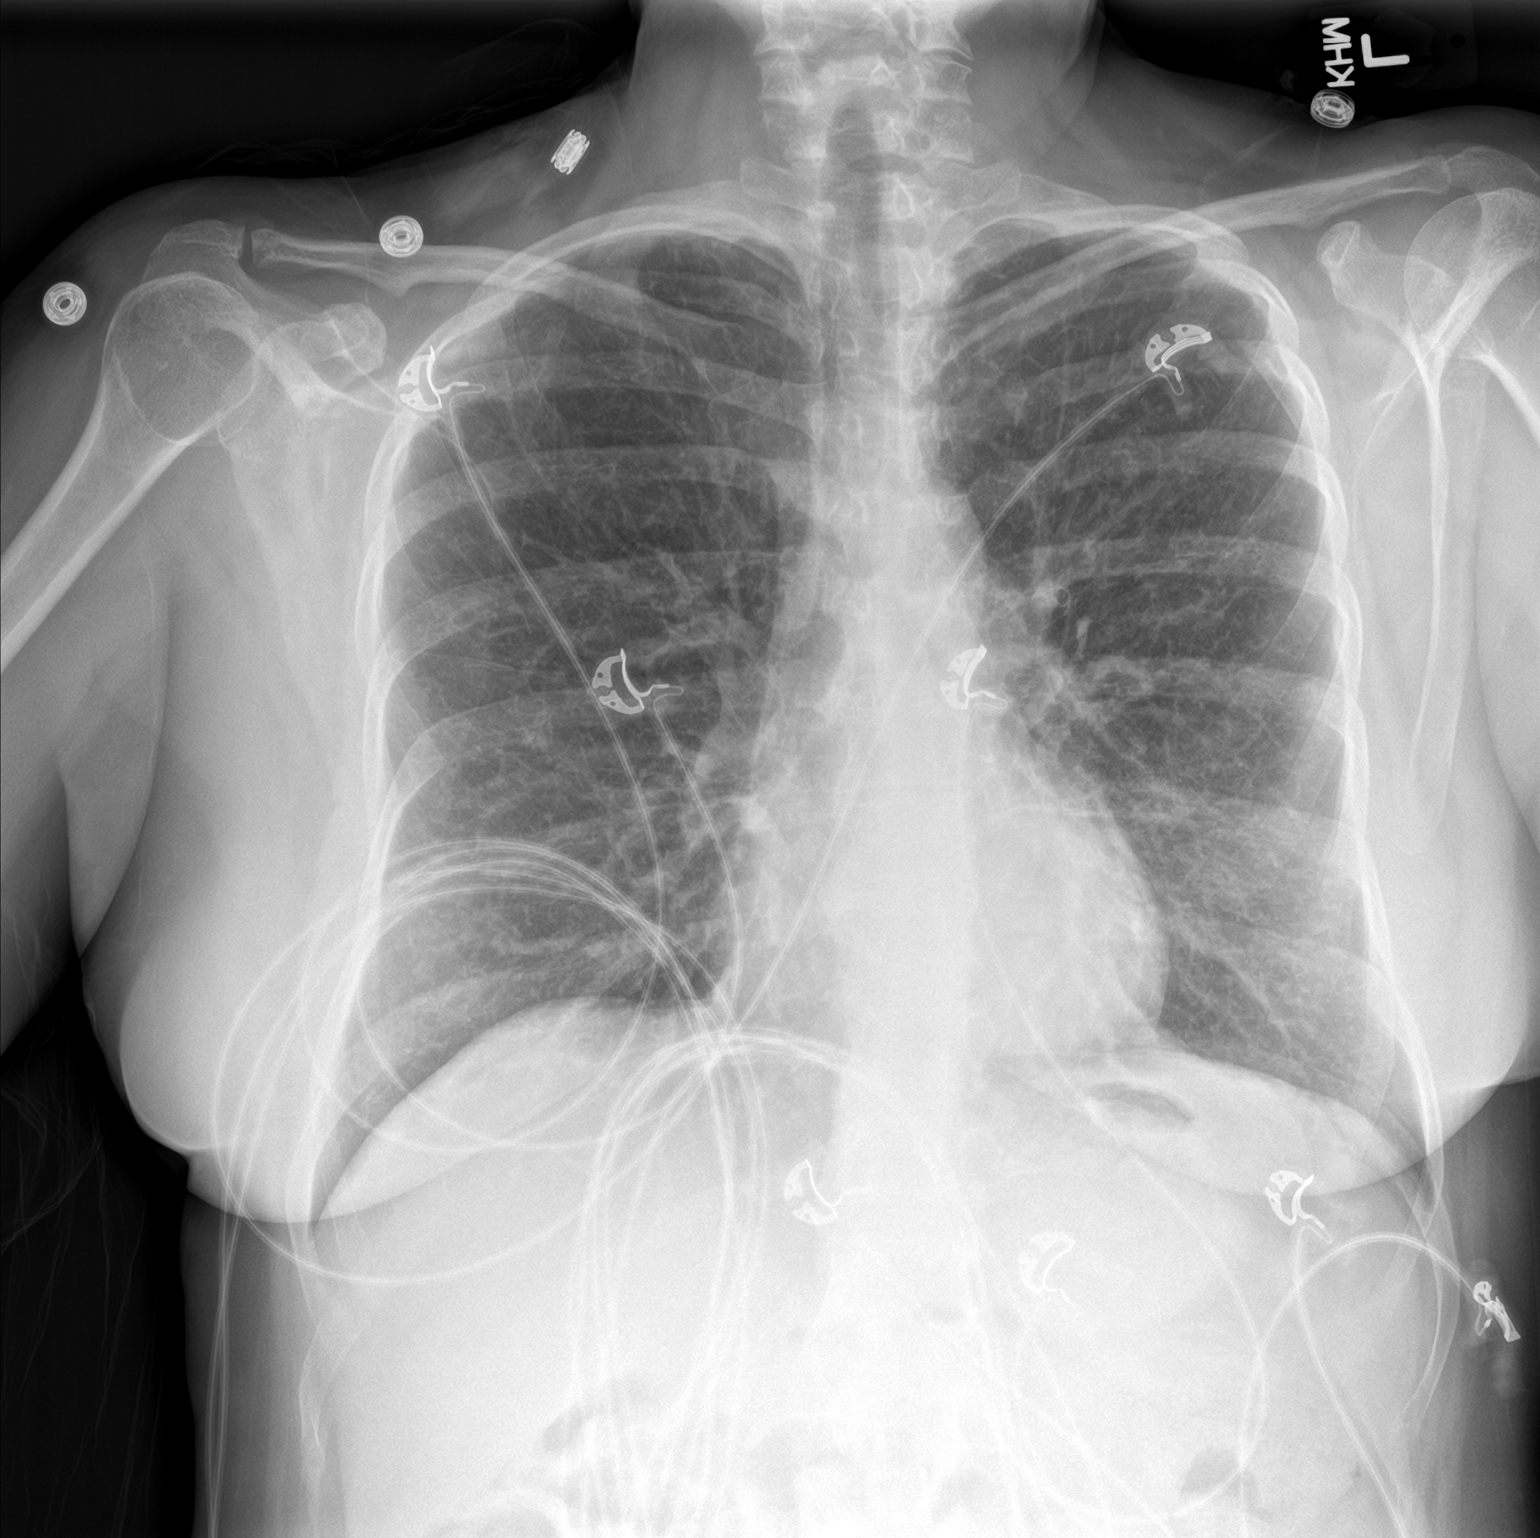

[chest lat]
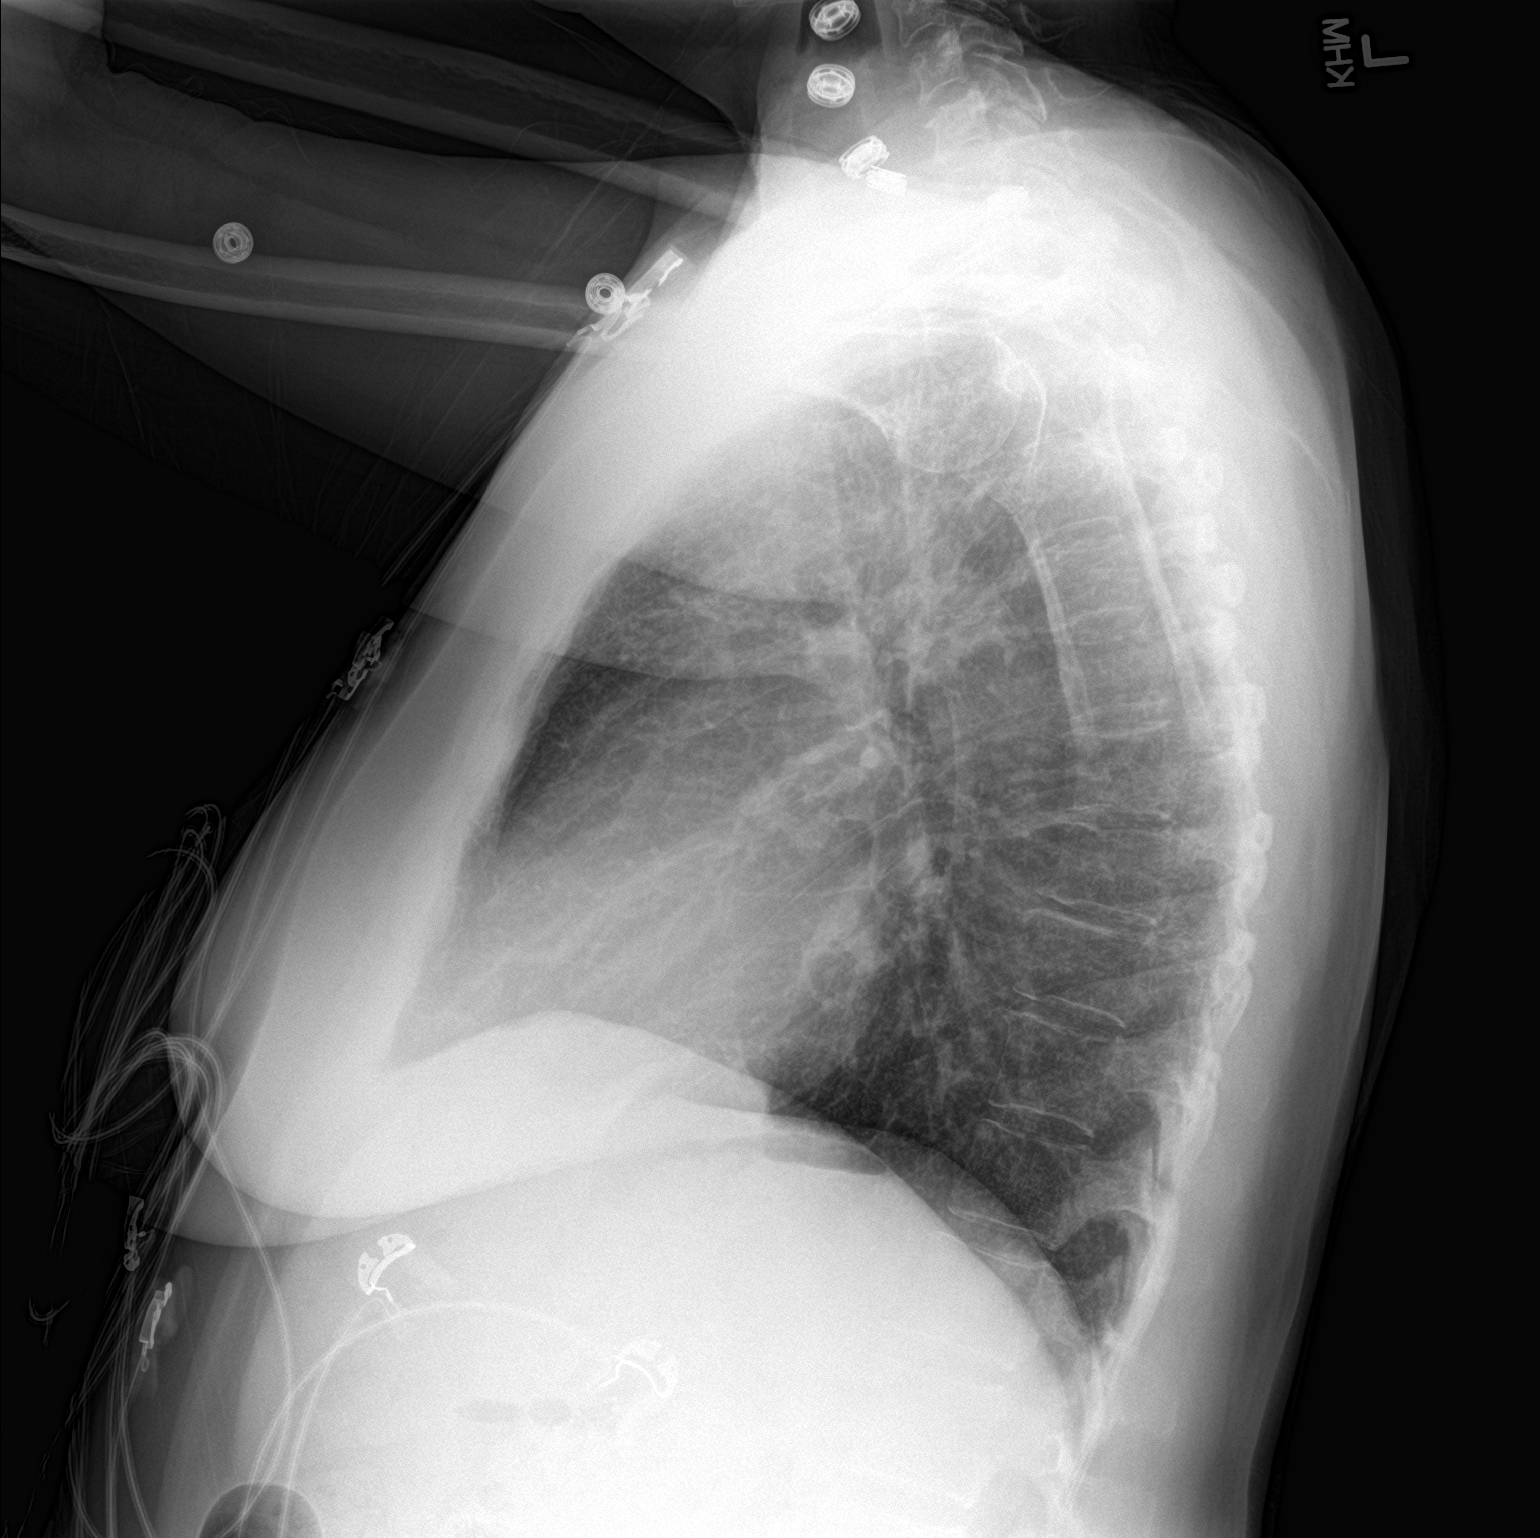

[2 of 2 positions shown; findings below may reference images not displayed]

FINDINGS: There is chronic interstitial thickening bilaterally with areas of
scarring in each upper lobe. There is no frank edema or
consolidation. The heart size and pulmonary vascularity are normal.
No adenopathy. No evident bone lesions.
IMPRESSION: Chronic interstitial thickening with scattered areas of scarring. No
frank edema or consolidation. No new opacity evident. Stable cardiac
silhouette. No evident adenopathy.

## 2019-12-21 ENCOUNTER — Other Ambulatory Visit (HOSPITAL_COMMUNITY): Payer: Self-pay

## 2019-12-21 ENCOUNTER — Encounter (HOSPITAL_COMMUNITY): Payer: Self-pay

## 2019-12-24 ENCOUNTER — Encounter (HOSPITAL_COMMUNITY): Payer: Self-pay

## 2019-12-24 ENCOUNTER — Ambulatory Visit (HOSPITAL_COMMUNITY): Admit: 2019-12-24 | Payer: BC Managed Care – PPO | Admitting: Internal Medicine

## 2019-12-24 SURGERY — COLONOSCOPY WITH PROPOFOL
Anesthesia: Monitor Anesthesia Care

## 2019-12-27 ENCOUNTER — Other Ambulatory Visit: Payer: Self-pay | Admitting: Nurse Practitioner

## 2020-01-28 ENCOUNTER — Other Ambulatory Visit (HOSPITAL_COMMUNITY): Payer: Self-pay | Admitting: Internal Medicine

## 2020-01-28 DIAGNOSIS — F17200 Nicotine dependence, unspecified, uncomplicated: Secondary | ICD-10-CM

## 2020-01-28 DIAGNOSIS — R935 Abnormal findings on diagnostic imaging of other abdominal regions, including retroperitoneum: Secondary | ICD-10-CM

## 2020-05-18 ENCOUNTER — Other Ambulatory Visit: Payer: Self-pay

## 2020-05-18 ENCOUNTER — Emergency Department (HOSPITAL_COMMUNITY): Payer: Self-pay

## 2020-05-18 ENCOUNTER — Emergency Department (HOSPITAL_COMMUNITY)
Admission: EM | Admit: 2020-05-18 | Discharge: 2020-05-19 | Disposition: A | Discharge status: Home / Self Care | Payer: Self-pay | Attending: Emergency Medicine | Admitting: Emergency Medicine

## 2020-05-18 ENCOUNTER — Encounter (HOSPITAL_COMMUNITY): Payer: Self-pay | Admitting: Emergency Medicine

## 2020-05-18 DIAGNOSIS — F17210 Nicotine dependence, cigarettes, uncomplicated: Secondary | ICD-10-CM | POA: Insufficient documentation

## 2020-05-18 DIAGNOSIS — R0789 Other chest pain: Secondary | ICD-10-CM | POA: Insufficient documentation

## 2020-05-18 DIAGNOSIS — I10 Essential (primary) hypertension: Secondary | ICD-10-CM | POA: Insufficient documentation

## 2020-05-18 DIAGNOSIS — F419 Anxiety disorder, unspecified: Secondary | ICD-10-CM | POA: Insufficient documentation

## 2020-05-18 DIAGNOSIS — R110 Nausea: Secondary | ICD-10-CM | POA: Insufficient documentation

## 2020-05-18 MED ORDER — CYCLOBENZAPRINE HCL 10 MG PO TABS
5.0000 mg | ORAL_TABLET | Freq: Once | ORAL | Status: AC
  Administered 2020-05-19: 5 mg via ORAL
  Filled 2020-05-18: qty 1

## 2020-05-18 MED ORDER — CYCLOBENZAPRINE HCL 5 MG PO TABS: 5.0000 mg | ORAL_TABLET | Freq: Three times a day (TID) | ORAL | 0 refills | Status: DC | PRN

## 2020-05-18 MED ORDER — IBUPROFEN 400 MG PO TABS
600.0000 mg | ORAL_TABLET | Freq: Once | ORAL | Status: AC
  Administered 2020-05-19: 600 mg via ORAL
  Filled 2020-05-18: qty 2

## 2020-05-18 NOTE — Discharge Instructions (Addendum)
Use ice and heat for comfort.  Take Motrin 600 mg 4 times a day.  Take the muscle relaxer Flexeril 3 times a day for your chest wall pain.  Try to stop smoking.  Keep going to your counselor for your anxiety.

## 2020-05-18 NOTE — ED Provider Notes (Signed)
[facility] EMERGENCY DEPARTMENT Provider Note   CSN: [id number] Arrival date & time: 05/18/20  2152   Time seen 11:30 PM  History Chief Complaint  Patient presents with   Chest Pain    Sherry [name] is a 49 y.o. female.  HPI Patient states she has had left-sided chest pain for the past 4 days.  She states it has been there constantly.  Coughing and movement make it worse although lifting up her left arm over her head makes it feel better.  Patient smokes a pack per day and has had a mild cough with minimal yellow sputum production.  She denies shortness of breath and feels like she has some wheezing sometimes.  She denies sore throat, rhinorrhea, vomiting, or diarrhea.  She has not had any fevers.  She has had some mild nausea.  She has tried Tylenol and aspirin twice a day without relief.  She has had similar pains before and has been seen in the ED several times before for same.  She states she was told before it was from anxiety.  She states her daughter is in the [employer] and is [place] and she is worried about her.  She states she does go to a therapist.  PCP Abran Richard, MD     Past Medical History:  Diagnosis Date   Depression    High cholesterol    Hypertension    Lung collapse    left    Patient Active Problem List   Diagnosis Date Noted   Preventative health care 06/21/2019   GERD (gastroesophageal reflux disease) 06/21/2019   Constipation 06/21/2019    Past Surgical History:  Procedure Laterality Date   CHEST TUBE INSERTION     ENDOMETRIAL ABLATION       OB History   No obstetric history on file.     Family History  Problem Relation Age of Onset   Colon polyps Mother    Colon cancer Maternal Grandmother    Colon cancer Maternal Grandfather    Colon cancer Paternal Grandmother    Colon cancer Paternal Grandfather    Colon cancer Paternal Uncle     Social History   Tobacco Use   Smoking status: Current Every Day Smoker     Packs/day: 0.50    Types: Cigarettes   Smokeless tobacco: Never Used  Vaping Use   Vaping Use: Never used  Substance Use Topics   Alcohol use: Yes    Comment: occasionally    Drug use: Never    Home Medications Prior to Admission medications   Medication Sig Start Date End Date Taking? Authorizing Provider  acetaminophen (TYLENOL) 650 MG CR tablet Take 650 mg by mouth every 8 (eight) hours as needed for pain.     [provider]  clonazePAM (KLONOPIN) 0.5 MG tablet Take 1 tablet (0.5 mg total) by mouth 2 (two) times daily as needed for up to 10 days for anxiety. 06/06/18 08/19/19  Evalee Jefferson, PA-C  cyclobenzaprine (FLEXERIL) 5 MG tablet Take 1 tablet (5 mg total) by mouth 3 (three) times daily as needed. 05/18/20   Rolland Porter, MD  hydrochlorothiazide (HYDRODIURIL) 25 MG tablet Take 25 mg by mouth every morning.  02/11/16   [provider]  HYDROcodone-acetaminophen (NORCO/VICODIN) 5-325 MG tablet Take 1 tablet by mouth every 6 (six) hours as needed. 08/19/19   Fredia Sorrow, MD  linaclotide [facility]) 72 MCG capsule Take 1 capsule (72 mcg total) by mouth daily before breakfast. Patient not taking: Reported on  08/19/2019 07/27/19   Carlis Stable, NP  lubiprostone (AMITIZA) 24 MCG capsule Take 1 capsule (24 mcg total) by mouth 2 (two) times daily with a meal. 09/11/19   Carlis Stable, NP  metoprolol succinate (TOPROL-XL) 50 MG 24 hr tablet Take 50 mg by mouth every morning. Take with or immediately following a meal.    [provider]  pantoprazole (PROTONIX) 40 MG tablet Take one tablet once to twice daily before meal for reflux. Take lowest effective dose. 12/27/19   Mahala Menghini, PA-C  polyethylene glycol-electrolytes (TRILYTE) 420 g solution Take 4,000 mLs by mouth as directed. 09/03/19   Fields, Marga Melnick, MD  QUEtiapine (SEROQUEL) 400 MG tablet Take 1 tablet (400 mg total) by mouth at bedtime. 06/06/18   Evalee Jefferson, PA-C  rosuvastatin (CRESTOR) 40 MG tablet Take 40  mg by mouth every morning.  02/10/16   [provider]  sertraline (ZOLOFT) 100 MG tablet Take 0.5 tablets (50 mg total) by mouth daily. 06/06/18   Evalee Jefferson, PA-C  venlafaxine XR (EFFEXOR-XR) 150 MG 24 hr capsule Take 150 mg by mouth daily. 08/06/19   [provider]  venlafaxine XR (EFFEXOR-XR) 75 MG 24 hr capsule Take 75 mg by mouth daily. 08/07/19   [provider]    Allergies    Dimenhydrinate  Review of Systems   Review of Systems  All other systems reviewed and are negative.   Physical Exam Updated Vital Signs BP 108/68    Pulse 93    Temp 98 F (36.7 C) (Oral)    Resp 16    Ht 5\' 5"  (1.651 m)    Wt 65.8 kg    SpO2 100%    BMI 24.13 kg/m   Physical Exam Vitals and nursing note reviewed.  Constitutional:      General: She is not in acute distress.    Appearance: Normal appearance. She is normal weight.     Comments: Patient has her hands clutching her left chest  HENT:     Head: Normocephalic and atraumatic.     Right Ear: External ear normal.     Left Ear: External ear normal.  Eyes:     Extraocular Movements: Extraocular movements intact.     Conjunctiva/sclera: Conjunctivae normal.     Pupils: Pupils are equal, round, and reactive to light.  Cardiovascular:     Rate and Rhythm: Normal rate and regular rhythm.     Pulses: Normal pulses.     Heart sounds: Normal heart sounds. No murmur heard.   Pulmonary:     Effort: Pulmonary effort is normal.     Breath sounds: Normal breath sounds. No wheezing or rales.     Comments: Rare rhonchi Chest:     Chest wall: Tenderness present.    Musculoskeletal:        General: No swelling. Normal range of motion.     Cervical back: Normal range of motion.  Skin:    General: Skin is warm and dry.  Neurological:     General: No focal deficit present.     Mental Status: She is alert and oriented to person, place, and time.     Cranial Nerves: No cranial nerve deficit.  Psychiatric:        Mood and  Affect: Mood is anxious.        Speech: Speech normal.        Behavior: Behavior normal.     ED Results / Procedures / Treatments  Labs (all labs ordered are listed, but only abnormal results are displayed)   EKG EKG Interpretation  Date/Time:  "Sunday May 18 2020 22:02:13 EST Ventricular Rate:  124 PR Interval:  144 QRS Duration: 76 QT Interval:  324 QTC Calculation: 465 R Axis:   90 Text Interpretation: Sinus tachycardia Possible Left atrial enlargement Rightward axis Since last tracing rate faster 02 Dec 2018 Confirmed by Christiane Sistare ([id number]) on 05/18/2020 11:08:45 PM   Radiology DG Chest 2 View  Result Date: 05/18/2020 CLINICAL DATA:  Chest and shoulder pain for 4 days, hypertension EXAM: CHEST - 2 VIEW COMPARISON:  08/19/2019 FINDINGS: Frontal and lateral views of the chest demonstrate an unremarkable cardiac silhouette. Chronic scarring is seen bilaterally consistent with history of tobacco abuse. No focal airspace disease, effusion, or pneumothorax. No acute bony abnormalities. IMPRESSION: 1. Chronic interstitial scarring compatible with tobacco abuse. 2. No acute intrathoracic process. Electronically Signed   By: Michael  Brown M.D.   On: 05/18/2020 22:44    Procedures Procedures (including critical care time)  Medications Ordered in ED Medications  ibuprofen (ADVIL) tablet 600 mg (has no administration in time range)  cyclobenzaprine (FLEXERIL) tablet 5 mg (has no administration in time range)    ED Course  I have reviewed the triage vital signs and the nursing notes.  Pertinent labs & imaging results that were available during my care of the patient were reviewed by me and considered in my medical decision making (see chart for details).    MDM Rules/Calculators/A&P                         Patient has been seen for chest pain 5 previous times in the past 2 years and most of her diagnoses have been chest wall pain or nonspecific chest pain.  Patient's exam  tonight is consistent with chest wall pain.  She was given ibuprofen and Flexeril and discharged home.  Final Clinical Impression(s) / ED Diagnoses Final diagnoses:  Chest wall pain    Rx / DC Orders ED Discharge Orders         Ordered    cyclobenzaprine (FLEXERIL) 5 MG tablet  3 times daily PRN        11" /07/21 2348        OTC ibuprofen  Plan discharge  Rolland Porter, MD, Barbette Or, MD 05/18/20 2354

## 2020-05-18 NOTE — ED Triage Notes (Signed)
Pt c/o chest and left shoulder pain for the past 4 days.

## 2020-09-08 ENCOUNTER — Emergency Department (HOSPITAL_COMMUNITY)
Admission: EM | Admit: 2020-09-08 | Discharge: 2020-09-09 | Disposition: A | Discharge status: Home / Self Care | Payer: Self-pay | Attending: Emergency Medicine | Admitting: Emergency Medicine

## 2020-09-08 ENCOUNTER — Other Ambulatory Visit: Payer: Self-pay

## 2020-09-08 ENCOUNTER — Encounter (HOSPITAL_COMMUNITY): Payer: Self-pay | Admitting: Emergency Medicine

## 2020-09-08 DIAGNOSIS — F17210 Nicotine dependence, cigarettes, uncomplicated: Secondary | ICD-10-CM | POA: Insufficient documentation

## 2020-09-08 DIAGNOSIS — I10 Essential (primary) hypertension: Secondary | ICD-10-CM | POA: Insufficient documentation

## 2020-09-08 DIAGNOSIS — R079 Chest pain, unspecified: Secondary | ICD-10-CM | POA: Insufficient documentation

## 2020-09-08 DIAGNOSIS — R42 Dizziness and giddiness: Secondary | ICD-10-CM | POA: Insufficient documentation

## 2020-09-08 DIAGNOSIS — E876 Hypokalemia: Secondary | ICD-10-CM | POA: Insufficient documentation

## 2020-09-08 NOTE — ED Triage Notes (Signed)
Pt c/o chest pain for months and states she feels tingly on the inside. Pt states she smoked weed today,

## 2020-09-09 ENCOUNTER — Emergency Department (HOSPITAL_COMMUNITY): Payer: Self-pay

## 2020-09-09 LAB — BASIC METABOLIC PANEL
Anion gap: 16 — ABNORMAL HIGH (ref 5–15)
BUN: 21 mg/dL — ABNORMAL HIGH (ref 6–20)
CO2: 25 mmol/L (ref 22–32)
Calcium: 9.2 mg/dL (ref 8.9–10.3)
Chloride: 92 mmol/L — ABNORMAL LOW (ref 98–111)
Creatinine, Ser: 0.9 mg/dL (ref 0.44–1.00)
GFR, Estimated: >60 mL/min (ref >60)
Glucose, Bld: 151 mg/dL — ABNORMAL HIGH (ref 70–99)
Potassium: 2.5 mmol/L — CL (ref 3.5–5.1)
Sodium: 133 mmol/L — ABNORMAL LOW (ref 135–145)

## 2020-09-09 LAB — CBC
HCT: 41.7 % (ref 36.0–46.0)
Hemoglobin: 14 g/dL (ref 12.0–15.0)
MCH: 31.6 pg (ref 26.0–34.0)
MCHC: 33.6 g/dL (ref 30.0–36.0)
MCV: 94.1 fL (ref 80.0–100.0)
Platelets: 347 10*3/uL (ref 150–400)
RBC: 4.43 MIL/uL (ref 3.87–5.11)
RDW: 14.3 % (ref 11.5–15.5)
WBC: 8.4 10*3/uL (ref 4.0–10.5)
nRBC: 0 % (ref 0.0–0.2)

## 2020-09-09 LAB — PROTIME-INR
INR: 0.9 (ref 0.8–1.2)
Prothrombin Time: 12.2 seconds (ref 11.4–15.2)

## 2020-09-09 LAB — TROPONIN I (HIGH SENSITIVITY): Troponin I (High Sensitivity): 2 ng/L (ref <18)

## 2020-09-09 MED ORDER — POTASSIUM CHLORIDE CRYS ER 20 MEQ PO TBCR
40.0000 meq | EXTENDED_RELEASE_TABLET | Freq: Once | ORAL | Status: AC
  Administered 2020-09-09: 40 meq via ORAL
  Filled 2020-09-09: qty 2

## 2020-09-09 MED ORDER — KETOROLAC TROMETHAMINE 15 MG/ML IJ SOLN
15.0000 mg | Freq: Once | INTRAMUSCULAR | Status: AC
  Administered 2020-09-09: 15 mg via INTRAVENOUS
  Filled 2020-09-09: qty 1

## 2020-09-09 MED ORDER — POTASSIUM CHLORIDE CRYS ER 20 MEQ PO TBCR: 20.0000 meq | EXTENDED_RELEASE_TABLET | Freq: Two times a day (BID) | ORAL | 0 refills | Status: DC

## 2020-09-09 MED ORDER — SODIUM CHLORIDE 0.9 % IV BOLUS
1000.0000 mL | Freq: Once | INTRAVENOUS | Status: AC
  Administered 2020-09-09: 1000 mL via INTRAVENOUS

## 2020-09-09 MED ORDER — POTASSIUM CHLORIDE 10 MEQ/100ML IV SOLN
10.0000 meq | INTRAVENOUS | Status: AC
  Administered 2020-09-09 (×3): 10 meq via INTRAVENOUS
  Filled 2020-09-09 (×3): qty 100

## 2020-09-09 MED ORDER — MAGNESIUM SULFATE 2 GM/50ML IV SOLN
2.0000 g | Freq: Once | INTRAVENOUS | Status: AC
  Administered 2020-09-09: 2 g via INTRAVENOUS
  Filled 2020-09-09: qty 50

## 2020-09-09 NOTE — Discharge Instructions (Addendum)
You were evaluated in the Emergency Department and after careful evaluation, we did not find any emergent condition requiring admission or further testing in the hospital.  Your exam/testing today was overall reassuring.  Your symptoms may be related to low potassium levels in your blood.  Your chest pain seems to be due to inflammation or strain of the muscles between your ribs.  For the pain we recommend Tylenol or Motrin at home.  Please take the potassium pills as directed and have your potassium levels rechecked by your primary care doctor within the next week.  Please return to the Emergency Department if you experience any worsening of your condition.  Thank you for allowing [place] to be a part of your care.

## 2020-09-09 NOTE — ED Provider Notes (Signed)
[facility] Emergency Department Provider Note MRN:  [id number]  Arrival date & time: 09/09/20     Chief Complaint   Chest Pain   History of Present Illness   Sherry [name] is a 50 y.o. year-old female with a history of hypertension presenting to the ED with chief complaint of chest.  Location: Left upper chest Duration: 1 or 2 months Onset: Gradual Timing: Constant Description: Sharp Severity: Mild to moderate Exacerbating/Alleviating Factors: Worse with palpation Associated Symptoms: Malaise, fatigue, low energy, lightheadedness Pertinent Negatives: Denies shortness of breath, no fever or cough.   Review of Systems  A complete 10 system review of systems was obtained and all systems are negative except as noted in the HPI and PMH.   Patient's Health History    Past Medical History:  Diagnosis Date  . Depression   . High cholesterol   . Hypertension   . Lung collapse    left    Past Surgical History:  Procedure Laterality Date  . CHEST TUBE INSERTION    . ENDOMETRIAL ABLATION      Family History  Problem Relation Age of Onset  . Colon polyps Mother   . Colon cancer Maternal Grandmother   . Colon cancer Maternal Grandfather   . Colon cancer Paternal Grandmother   . Colon cancer Paternal Grandfather   . Colon cancer Paternal Uncle     Social History   Socioeconomic History  . Marital status: Divorced    Spouse name: Not on file  . Number of children: Not on file  . Years of education: Not on file  . Highest education level: Not on file  Occupational History  . Not on file  Tobacco Use  . Smoking status: Current Every Day Smoker    Packs/day: 0.50    Types: Cigarettes  . Smokeless tobacco: Never Used  Vaping Use  . Vaping Use: Never used  Substance and Sexual Activity  . Alcohol use: Yes    Comment: occasionally   . Drug use: Never  . Sexual activity: Not on file  Other Topics Concern  . Not on file  Social History  Narrative  . Not on file   Social Determinants of Health   Financial Resource Strain: Not on file  Food Insecurity: Not on file  Transportation Needs: Not on file  Physical Activity: Not on file  Stress: Not on file  Social Connections: Not on file  Intimate Partner Violence: Not on file     Physical Exam   Vitals:   09/09/20 0430 09/09/20 0500  BP: 129/81 110/69  Pulse: 98 92  Resp: 20 18  Temp:    SpO2: 97% 99%    CONSTITUTIONAL: Well-appearing, NAD NEURO:  Alert and oriented x 3, no focal deficits EYES:  eyes equal and reactive ENT/NECK:  no LAD, no JVD CARDIO: Regular rate, well-perfused, normal S1 and S2; focal tenderness palpation to the left upper chest wall PULM:  CTAB no wheezing or rhonchi GI/GU:  normal bowel sounds, non-distended, non-tender MSK/SPINE:  No gross deformities, no edema SKIN:  no rash, atraumatic PSYCH:  Appropriate speech and behavior  *Additional and/or pertinent findings included in MDM below  Diagnostic and Interventional Summary    EKG Interpretation  Date/Time: September 09, 2020 at 00: 06: 30   Ventricular Rate:  110 PR Interval:  195 QRS Duration: 96 QT Interval:  399 QTC Calculation: 540 R Axis:     Text Interpretation: Sinus rhythm, prolonged QT Confirmed by Dr. Legrand Como  Chloe Bluett at 3:08 AM      Labs Reviewed  BASIC METABOLIC PANEL - Abnormal; Notable for the following components:      Result Value   Sodium 133 (*)    Potassium 2.5 (*)    Chloride 92 (*)    Glucose, Bld 151 (*)    BUN 21 (*)    Anion gap 16 (*)    All other components within normal limits  CBC  PROTIME-INR  TROPONIN I (HIGH SENSITIVITY)    DG Chest Port 1 View  Final Result      Medications  sodium chloride 0.9 % bolus 1,000 mL (0 mLs Intravenous Stopped 09/09/20 0301)  magnesium sulfate IVPB 2 g 50 mL (0 g Intravenous Stopped 09/09/20 0301)  potassium chloride SA (KLOR-CON) CR tablet 40 mEq (40 mEq Oral Given 09/09/20 0202)  potassium chloride 10 mEq in  100 mL IVPB (0 mEq Intravenous Stopped 09/09/20 0503)  ketorolac (TORADOL) 15 MG/ML injection 15 mg (15 mg Intravenous Given 09/09/20 0327)     Procedures  /  Critical Care Procedures  ED Course and Medical Decision Making  I have reviewed the triage vital signs, the nursing notes, and pertinent available records from the EMR.  Listed above are laboratory and imaging tests that I personally ordered, reviewed, and interpreted and then considered in my medical decision making (see below for details).  Chest pain is most consistent with MSK etiology, EKG is without ischemic features but does show prolonged QT.  Troponin is negative and with the pain being present for over a month no need for second troponin.  Mild tachycardia on arrival but this is resolved with IV fluids.  No evidence of DVT, no shortness of breath, pain is not pleuritic, doubt PE.  Labs reveal low potassium at 2.5, suspect this explains patient's other more vague symptoms.  Providing a few rounds of potassium, oral potassium, oral magnesium, suspect will be able to discharge with PCP follow-up.       Barth Kirks. Sedonia Small, [place] [email or web address]  Final Clinical Impressions(s) / ED Diagnoses     ICD-10-CM   1. Chest pain, unspecified type  R07.9   2. Hypokalemia  E87.6     ED Discharge Orders         Ordered    potassium chloride SA (KLOR-CON) 20 MEQ tablet  2 times daily        09/09/20 0505           Discharge Instructions Discussed with and Provided to Patient:     Discharge Instructions     You were evaluated in the Emergency Department and after careful evaluation, we did not find any emergent condition requiring admission or further testing in the hospital.  Your exam/testing today was overall reassuring.  Your symptoms may be related to low potassium levels in your blood.  Your chest pain seems to be due to inflammation or strain of the muscles between  your ribs.  For the pain we recommend Tylenol or Motrin at home.  Please take the potassium pills as directed and have your potassium levels rechecked by your primary care doctor within the next week.  Please return to the Emergency Department if you experience any worsening of your condition.  Thank you for allowing [place] to be a part of your care.        Maudie Flakes, MD 09/09/20 [phone]

## 2020-09-09 NOTE — ED Notes (Signed)
Date and time results received: 09/09/20 0145   Test: Potassium Critical Value: 2.5  Name of Provider Notified: BERO  Orders Received? Or Actions Taken?: NA

## 2020-09-17 ENCOUNTER — Other Ambulatory Visit (HOSPITAL_COMMUNITY): Payer: Self-pay | Admitting: Internal Medicine

## 2020-09-17 DIAGNOSIS — R935 Abnormal findings on diagnostic imaging of other abdominal regions, including retroperitoneum: Secondary | ICD-10-CM

## 2020-09-17 DIAGNOSIS — F17200 Nicotine dependence, unspecified, uncomplicated: Secondary | ICD-10-CM

## 2020-10-14 ENCOUNTER — Ambulatory Visit (HOSPITAL_COMMUNITY)
Admission: RE | Admit: 2020-10-14 | Discharge: 2020-10-14 | Disposition: A | Discharge status: Home / Self Care | Payer: Self-pay | Source: Ambulatory Visit | Attending: Internal Medicine | Admitting: Internal Medicine

## 2020-10-14 DIAGNOSIS — F17200 Nicotine dependence, unspecified, uncomplicated: Secondary | ICD-10-CM | POA: Insufficient documentation

## 2020-10-14 DIAGNOSIS — R935 Abnormal findings on diagnostic imaging of other abdominal regions, including retroperitoneum: Secondary | ICD-10-CM | POA: Insufficient documentation

## 2020-12-03 ENCOUNTER — Emergency Department (HOSPITAL_COMMUNITY)
Admission: EM | Admit: 2020-12-03 | Discharge: 2020-12-03 | Disposition: A | Discharge status: Home / Self Care | Payer: Self-pay | Attending: Emergency Medicine | Admitting: Emergency Medicine

## 2020-12-03 ENCOUNTER — Emergency Department (HOSPITAL_COMMUNITY): Payer: Self-pay

## 2020-12-03 ENCOUNTER — Other Ambulatory Visit: Payer: Self-pay

## 2020-12-03 ENCOUNTER — Encounter (HOSPITAL_COMMUNITY): Payer: Self-pay

## 2020-12-03 DIAGNOSIS — I10 Essential (primary) hypertension: Secondary | ICD-10-CM | POA: Insufficient documentation

## 2020-12-03 DIAGNOSIS — F17210 Nicotine dependence, cigarettes, uncomplicated: Secondary | ICD-10-CM | POA: Insufficient documentation

## 2020-12-03 DIAGNOSIS — R079 Chest pain, unspecified: Secondary | ICD-10-CM | POA: Insufficient documentation

## 2020-12-03 DIAGNOSIS — R000 Tachycardia, unspecified: Secondary | ICD-10-CM | POA: Insufficient documentation

## 2020-12-03 DIAGNOSIS — J449 Chronic obstructive pulmonary disease, unspecified: Secondary | ICD-10-CM | POA: Insufficient documentation

## 2020-12-03 DIAGNOSIS — Z79899 Other long term (current) drug therapy: Secondary | ICD-10-CM | POA: Insufficient documentation

## 2020-12-03 DIAGNOSIS — Z7982 Long term (current) use of aspirin: Secondary | ICD-10-CM | POA: Insufficient documentation

## 2020-12-03 HISTORY — DX: Chronic obstructive pulmonary disease, unspecified: J44.9

## 2020-12-03 LAB — CBC
HCT: 48.2 % — ABNORMAL HIGH (ref 36.0–46.0)
Hemoglobin: 15.6 g/dL — ABNORMAL HIGH (ref 12.0–15.0)
MCH: 31.6 pg (ref 26.0–34.0)
MCHC: 32.4 g/dL (ref 30.0–36.0)
MCV: 97.8 fL (ref 80.0–100.0)
Platelets: 347 10*3/uL (ref 150–400)
RBC: 4.93 MIL/uL (ref 3.87–5.11)
RDW: 13.4 % (ref 11.5–15.5)
WBC: 8.9 10*3/uL (ref 4.0–10.5)
nRBC: 0 % (ref 0.0–0.2)

## 2020-12-03 LAB — BASIC METABOLIC PANEL
Anion gap: 8 (ref 5–15)
BUN: 10 mg/dL (ref 6–20)
CO2: 26 mmol/L (ref 22–32)
Calcium: 10 mg/dL (ref 8.9–10.3)
Chloride: 107 mmol/L (ref 98–111)
Creatinine, Ser: 0.85 mg/dL (ref 0.44–1.00)
GFR, Estimated: >60 mL/min (ref >60)
Glucose, Bld: 104 mg/dL — ABNORMAL HIGH (ref 70–99)
Potassium: 4.5 mmol/L (ref 3.5–5.1)
Sodium: 141 mmol/L (ref 135–145)

## 2020-12-03 LAB — TROPONIN I (HIGH SENSITIVITY)
Troponin I (High Sensitivity): <2 ng/L (ref <18)
Troponin I (High Sensitivity): <2 ng/L (ref <18)

## 2020-12-03 LAB — D-DIMER, QUANTITATIVE: D-Dimer, Quant: <0.27 ug/mL-FEU (ref 0.00–0.50)

## 2020-12-03 LAB — POC URINE PREG, ED: Preg Test, Ur: NEGATIVE

## 2020-12-03 MED ORDER — ETODOLAC 300 MG PO CAPS: 300.0000 mg | ORAL_CAPSULE | Freq: Three times a day (TID) | ORAL | 0 refills | Status: DC

## 2020-12-03 MED ORDER — KETOROLAC TROMETHAMINE 30 MG/ML IJ SOLN
30.0000 mg | Freq: Once | INTRAMUSCULAR | Status: AC
  Administered 2020-12-03: 30 mg via INTRAMUSCULAR
  Filled 2020-12-03: qty 1

## 2020-12-03 NOTE — ED Provider Notes (Signed)
[facility] EMERGENCY DEPARTMENT Provider Note   CSN: [id number] Arrival date & time: 12/03/20  1756     History Chief complaint: Chest pain  Sherry [name] is a 50 y.o. female.  HPI  HPI: A 50 year old patient with a history of hypertension and hypercholesterolemia presents for evaluation of chest pain. Initial onset of pain was more than 6 hours ago. The patient's chest pain is sharp and is not worse with exertion. The patient's chest pain is middle- or left-sided, is not well-localized, is not described as heaviness/pressure/tightness and does radiate to the arms/jaw/neck. The patient does not complain of nausea and denies diaphoresis. The patient has smoked in the past 90 days and has a family history of coronary artery disease in a first-degree relative with onset less than age 59. The patient has no history of stroke, has no history of peripheral artery disease, denies any history of treated diabetes and does not have an elevated BMI (>=30).  Patient states the pain is very sharp in the left side of her chest.  It has been going on for several weeks to months.  She called her doctor and was instructed to come to the ED to get checked out Past Medical History:  Diagnosis Date  . COPD (chronic obstructive pulmonary disease) ([place])   . Depression   . High cholesterol   . Hypertension   . Lung collapse    left    Patient Active Problem List   Diagnosis Date Noted  . Preventative health care 06/21/2019  . GERD (gastroesophageal reflux disease) 06/21/2019  . Constipation 06/21/2019    Past Surgical History:  Procedure Laterality Date  . CHEST TUBE INSERTION    . ENDOMETRIAL ABLATION       OB History   No obstetric history on file.     Family History  Problem Relation Age of Onset  . Colon polyps Mother   . Colon cancer Maternal Grandmother   . Colon cancer Maternal Grandfather   . Colon cancer Paternal Grandmother   . Colon cancer Paternal Grandfather   . Colon cancer  Paternal Uncle     Social History   Tobacco Use  . Smoking status: Current Every Day Smoker    Packs/day: 0.50    Types: Cigarettes  . Smokeless tobacco: Never Used  Vaping Use  . Vaping Use: Never used  Substance Use Topics  . Alcohol use: Yes    Comment: occasionally   . Drug use: Never    Home Medications Prior to Admission medications   Medication Sig Start Date End Date Taking? Authorizing Provider  acetaminophen (TYLENOL) 650 MG CR tablet Take 650 mg by mouth every 8 (eight) hours as needed for pain.    Yes [provider]  aspirin EC 81 MG tablet Take 81 mg by mouth daily. Swallow whole.   Yes [provider]  clonazePAM (KLONOPIN) 0.5 MG tablet Take 1 tablet (0.5 mg total) by mouth 2 (two) times daily as needed for up to 10 days for anxiety. Patient taking differently: Take 0.5 mg by mouth 3 (three) times daily. Take 1/2 tablet in the morning and 1 tablet in the afternoon and then 1 tablet at night 06/06/18 08/19/19 Yes Idol, Almyra Free, PA-C  etodolac (LODINE) 300 MG capsule Take 1 capsule (300 mg total) by mouth every 8 (eight) hours. 12/03/20  Yes Dorie Rank, MD  hydrochlorothiazide (HYDRODIURIL) 25 MG tablet Take 25 mg by mouth every morning.  02/11/16  Yes [provider]  ibuprofen (  ADVIL) 100 MG tablet Take 200 mg by mouth every 6 (six) hours as needed for fever.   Yes [provider]  metoprolol succinate (TOPROL-XL) 50 MG 24 hr tablet Take 50 mg by mouth every morning. Take with or immediately following a meal.   Yes [provider]  potassium chloride SA (KLOR-CON) 20 MEQ tablet Take 1 tablet (20 mEq total) by mouth 2 (two) times daily for 3 days. 09/09/20 09/12/20 Yes Maudie Flakes, MD  QUEtiapine (SEROQUEL) 400 MG tablet Take 1 tablet (400 mg total) by mouth at bedtime. Patient taking differently: Take 800 mg by mouth at bedtime. 06/06/18  Yes Idol, Almyra Free, PA-C  rosuvastatin (CRESTOR) 40 MG tablet Take 40 mg by mouth every morning.   02/10/16  Yes [provider]  cyclobenzaprine (FLEXERIL) 5 MG tablet Take 1 tablet (5 mg total) by mouth 3 (three) times daily as needed. Patient not taking: Reported on 12/03/2020 05/18/20   Rolland Porter, MD  HYDROcodone-acetaminophen (NORCO/VICODIN) 5-325 MG tablet Take 1 tablet by mouth every 6 (six) hours as needed. Patient not taking: Reported on 12/03/2020 08/19/19   Fredia Sorrow, MD  linaclotide [facility]) 72 MCG capsule Take 1 capsule (72 mcg total) by mouth daily before breakfast. Patient not taking: No sig reported 07/27/19   Carlis Stable, NP  lubiprostone (AMITIZA) 24 MCG capsule Take 1 capsule (24 mcg total) by mouth 2 (two) times daily with a meal. Patient not taking: Reported on 12/03/2020 09/11/19   Carlis Stable, NP  pantoprazole (PROTONIX) 40 MG tablet Take one tablet once to twice daily before meal for reflux. Take lowest effective dose. Patient not taking: Reported on 12/03/2020 12/27/19   Mahala Menghini, PA-C  polyethylene glycol-electrolytes (TRILYTE) 420 g solution Take 4,000 mLs by mouth as directed. Patient not taking: Reported on 12/03/2020 09/03/19   Danie Binder, MD  Potassium Chloride ER 20 MEQ TBCR Take 1 tablet by mouth daily. Patient not taking: Reported on 12/03/2020 09/17/20   [provider]  sertraline (ZOLOFT) 100 MG tablet Take 0.5 tablets (50 mg total) by mouth daily. Patient not taking: Reported on 12/03/2020 06/06/18   Evalee Jefferson, PA-C  venlafaxine XR (EFFEXOR-XR) 150 MG 24 hr capsule Take 150 mg by mouth daily. Patient not taking: Reported on 12/03/2020 08/06/19   [provider]  venlafaxine XR (EFFEXOR-XR) 75 MG 24 hr capsule Take 75 mg by mouth daily. Patient not taking: Reported on 12/03/2020 08/07/19   [provider]    Allergies    Dimenhydrinate  Review of Systems   Review of Systems  All other systems reviewed and are negative.   Physical Exam Updated Vital Signs BP 108/82   Pulse 87   Temp 98.1 F (36.7 C)  (Oral)   Resp 16   Ht 1.651 m (5\' 5" )   Wt 77.1 kg   SpO2 99%   BMI 28.29 kg/m   Physical Exam Vitals and nursing note reviewed.  Constitutional:      General: She is not in acute distress.    Appearance: She is well-developed.  HENT:     Head: Normocephalic and atraumatic.     Right Ear: External ear normal.     Left Ear: External ear normal.  Eyes:     General: No scleral icterus.       Right eye: No discharge.        Left eye: No discharge.     Conjunctiva/sclera: Conjunctivae normal.  Neck:  Trachea: No tracheal deviation.  Cardiovascular:     Rate and Rhythm: Regular rhythm. Tachycardia present.  Pulmonary:     Effort: Pulmonary effort is normal. No respiratory distress.     Breath sounds: Normal breath sounds. No stridor. No wheezing or rales.  Abdominal:     General: Bowel sounds are normal. There is no distension.     Palpations: Abdomen is soft.     Tenderness: There is no abdominal tenderness. There is no guarding or rebound.  Musculoskeletal:        General: No tenderness.     Cervical back: Neck supple.  Skin:    General: Skin is warm and dry.     Findings: No rash.  Neurological:     Mental Status: She is alert.     Cranial Nerves: No cranial nerve deficit (no facial droop, extraocular movements intact, no slurred speech).     Sensory: No sensory deficit.     Motor: No abnormal muscle tone or seizure activity.     Coordination: Coordination normal.     ED Results / Procedures / Treatments   Labs (all labs ordered are listed, but only abnormal results are displayed) Labs Reviewed  BASIC METABOLIC PANEL - Abnormal; Notable for the following components:      Result Value   Glucose, Bld 104 (*)    All other components within normal limits  CBC - Abnormal; Notable for the following components:   Hemoglobin 15.6 (*)    HCT 48.2 (*)    All other components within normal limits  D-DIMER, QUANTITATIVE  POC URINE PREG, ED  TROPONIN I (HIGH  SENSITIVITY)  TROPONIN I (HIGH SENSITIVITY)    EKG EKG Interpretation  Date/Time:  Wednesday Dec 03 2020 18:30:52 EDT Ventricular Rate:  116 PR Interval:  140 QRS Duration: 76 QT Interval:  318 QTC Calculation: 442 R Axis:   106 Text Interpretation: Sinus tachycardia Right atrial enlargement Rightward axis Nonspecific T wave abnormality Abnormal ECG No significant change since last tracing Confirmed by Dorie Rank [phone]) on 12/03/2020 6:36:35 PM   Radiology DG Chest 2 View  Result Date: 12/03/2020 CLINICAL DATA:  50 year old female with chest pain. EXAM: CHEST - 2 VIEW COMPARISON:  Chest radiograph dated 09/09/2020. FINDINGS: There is diffuse chronic interstitial coarsening. No focal consolidation, pleural effusion, or pneumothorax. The cardiac silhouette is within limits. No acute osseous pathology. IMPRESSION: No active cardiopulmonary disease. Electronically Signed   By: Anner Crete M.D.   On: 12/03/2020 19:35    Procedures Procedures   Medications Ordered in ED Medications  ketorolac (TORADOL) 30 MG/ML injection 30 mg (30 mg Intramuscular Given 12/03/20 2015)    ED Course  I have reviewed the triage vital signs and the nursing notes.  Pertinent labs & imaging results that were available during my care of the patient were reviewed by me and considered in my medical decision making (see chart for details).  Clinical Course as of 12/03/20 2217  Wed Dec 03, 2020  2202 Troponin and D-dimer negative. [JK]    Clinical Course User Index [JK] Dorie Rank, MD   MDM Rules/Calculators/A&P HEAR Score: 4                       Patient presents with complaints of sharp left-sided chest pain.  Patient does have cardiac risk factors.  Is also concerned about the possibility of pulmonary embolism with the sharp nature of her pain.  ED work-up is reassuring.  Metabolic  panel and D-dimer negative.  Troponin is also normal.  CBC is unremarkable.  No signs of pneumonia or pneumothorax on  chest x-ray.  Doubt ACS.  Symptoms may be related to pleurisy.  Will DC home on NSAIDs.  Discussed outpatient follow-up with PCP. Final Clinical Impression(s) / ED Diagnoses Final diagnoses:  Chest pain, unspecified type    Rx / DC Orders ED Discharge Orders         Ordered    etodolac (LODINE) 300 MG capsule  Every 8 hours       Note to Pharmacy: As needed for pain   12/03/20 2217           Dorie Rank, MD 12/03/20 2218

## 2020-12-03 NOTE — ED Triage Notes (Signed)
Pt reports chest pain and tachycardia x 1 1/2 months ago.  Says she was here then and had low potassium.  Reports has been started on daily potassium pills.  Pt say still having tachycardia and chest pain.

## 2020-12-03 NOTE — Discharge Instructions (Addendum)
Take the medications as needed for pain.  Follow-up with your doctor to be rechecked if the symptoms persist

## 2020-12-04 LAB — POC URINE PREG, ED: Preg Test, Ur: NEGATIVE

## 2020-12-06 ENCOUNTER — Other Ambulatory Visit: Payer: Self-pay

## 2020-12-06 ENCOUNTER — Emergency Department (HOSPITAL_COMMUNITY)
Admission: EM | Admit: 2020-12-06 | Discharge: 2020-12-06 | Disposition: A | Discharge status: Home / Self Care | Payer: Self-pay | Attending: Emergency Medicine | Admitting: Emergency Medicine

## 2020-12-06 ENCOUNTER — Encounter (HOSPITAL_COMMUNITY): Payer: Self-pay | Admitting: *Deleted

## 2020-12-06 DIAGNOSIS — R000 Tachycardia, unspecified: Secondary | ICD-10-CM | POA: Insufficient documentation

## 2020-12-06 DIAGNOSIS — Z7982 Long term (current) use of aspirin: Secondary | ICD-10-CM | POA: Insufficient documentation

## 2020-12-06 DIAGNOSIS — F17210 Nicotine dependence, cigarettes, uncomplicated: Secondary | ICD-10-CM | POA: Insufficient documentation

## 2020-12-06 DIAGNOSIS — Z79899 Other long term (current) drug therapy: Secondary | ICD-10-CM | POA: Insufficient documentation

## 2020-12-06 DIAGNOSIS — J449 Chronic obstructive pulmonary disease, unspecified: Secondary | ICD-10-CM | POA: Insufficient documentation

## 2020-12-06 DIAGNOSIS — R079 Chest pain, unspecified: Secondary | ICD-10-CM | POA: Insufficient documentation

## 2020-12-06 DIAGNOSIS — I10 Essential (primary) hypertension: Secondary | ICD-10-CM | POA: Insufficient documentation

## 2020-12-06 MED ORDER — LORAZEPAM 1 MG PO TABS
1.0000 mg | ORAL_TABLET | Freq: Once | ORAL | Status: AC
  Administered 2020-12-06: 1 mg via ORAL
  Filled 2020-12-06: qty 1

## 2020-12-06 MED ORDER — KETOROLAC TROMETHAMINE 30 MG/ML IJ SOLN
30.0000 mg | Freq: Once | INTRAMUSCULAR | Status: AC
  Administered 2020-12-06: 30 mg via INTRAVENOUS
  Filled 2020-12-06: qty 1

## 2020-12-06 MED ORDER — METOPROLOL TARTRATE 50 MG PO TABS: 25.0000 mg | ORAL_TABLET | Freq: Two times a day (BID) | ORAL | 1 refills | Status: DC

## 2020-12-06 MED ORDER — METOPROLOL TARTRATE 5 MG/5ML IV SOLN
5.0000 mg | Freq: Once | INTRAVENOUS | Status: AC
  Administered 2020-12-06: 5 mg via INTRAVENOUS
  Filled 2020-12-06: qty 5

## 2020-12-06 MED ORDER — METOPROLOL TARTRATE 50 MG PO TABS
50.0000 mg | ORAL_TABLET | Freq: Once | ORAL | Status: DC
  Filled 2020-12-06: qty 1

## 2020-12-06 NOTE — Discharge Instructions (Signed)
Please stop taking hydrochlorothiazide and start taking metoprolol, half of a tablet by mouth twice a day.  You will need to follow-up with your family doctor as well as with a cardiologist, I have sent a a referral to cardiology.  Return to the emergency department for severe or worsening symptoms

## 2020-12-06 NOTE — ED Provider Notes (Signed)
[facility] EMERGENCY DEPARTMENT Provider Note   CSN: [id number] Arrival date & time: 12/06/20  2019     History Chief Complaint  Patient presents with  . Chest Pain    Sherry [name] is a 50 y.o. female.  HPI   This patient is a 50 year old female, she has a history of COPD, she has a history of depression anxiety and bipolar disorder according to her report, she currently takes Klonopin, Seroquel, hydrochlorothiazide and was recently prescribed etodolac because of pain in her chest.  She was seen in the emergency department several days ago and has been seen in the emergency department multiple times in the past because of this chest pain, today it is exactly the same as it was on the 25th, several days ago.  It is a sharp and sometimes heavy feeling that radiates to her left shoulder blade, it does not seem particularly worse with breathing but gets worse with palpation over the chest, movement of the arm and is correlated with tachycardia.  She has had heart rates in the morning of around 98 which seems to go up every time she takes her heart rate and this evening comes in with a heart rate of around 130.  She denies shortness of breath, coughing, swelling of the legs, abdominal pain, nausea vomiting or diarrhea.  She has been seen by her family doctor who has been treating her with hydrochlorothiazide, the patient is questioning why she is not on a beta-blocker.  She has been referred to cardiology but states that she does not have the money to see them so she has never seen them in the office.  She also has never had a Holter monitor.  She does not feel palpitations at this time and has not had any diarrhea or rectal bleeding or dysuria.  I reviewed the patient's labs from her last visit several days ago which are reassuring including a negative undetectable D-dimer, negative troponin x2, negative chest x-ray.  The patient endorses the occasional use of marijuana but this has not been for  several weeks.  She also states that she does not use cocaine or any other sympathomimetic drugs.  She does not drink alcohol.  She does smoke cigarettes  Past Medical History:  Diagnosis Date  . COPD (chronic obstructive pulmonary disease) ([place])   . Depression   . High cholesterol   . Hypertension   . Lung collapse    left    Patient Active Problem List   Diagnosis Date Noted  . Preventative health care 06/21/2019  . GERD (gastroesophageal reflux disease) 06/21/2019  . Constipation 06/21/2019    Past Surgical History:  Procedure Laterality Date  . CHEST TUBE INSERTION    . ENDOMETRIAL ABLATION       OB History   No obstetric history on file.     Family History  Problem Relation Age of Onset  . Colon polyps Mother   . Colon cancer Maternal Grandmother   . Colon cancer Maternal Grandfather   . Colon cancer Paternal Grandmother   . Colon cancer Paternal Grandfather   . Colon cancer Paternal Uncle     Social History   Tobacco Use  . Smoking status: Current Every Day Smoker    Packs/day: 0.50    Types: Cigarettes  . Smokeless tobacco: Never Used  Vaping Use  . Vaping Use: Never used  Substance Use Topics  . Alcohol use: Yes    Comment: occasionally   . Drug use: Never  Home Medications Prior to Admission medications   Medication Sig Start Date End Date Taking? Authorizing Provider  metoprolol tartrate (LOPRESSOR) 50 MG tablet Take 0.5 tablets (25 mg total) by mouth 2 (two) times daily. 12/06/20  Yes Noemi Chapel, MD  acetaminophen (TYLENOL) 650 MG CR tablet Take 650 mg by mouth every 8 (eight) hours as needed for pain.     [provider]  aspirin EC 81 MG tablet Take 81 mg by mouth daily. Swallow whole.    [provider]  clonazePAM (KLONOPIN) 0.5 MG tablet Take 1 tablet (0.5 mg total) by mouth 2 (two) times daily as needed for up to 10 days for anxiety. Patient taking differently: Take 0.5 mg by mouth 3 (three) times daily. Take 1/2  tablet in the morning and 1 tablet in the afternoon and then 1 tablet at night 06/06/18 08/19/19  Idol, Almyra Free, PA-C  etodolac (LODINE) 300 MG capsule Take 1 capsule (300 mg total) by mouth every 8 (eight) hours. 12/03/20   Dorie Rank, MD  ibuprofen (ADVIL) 100 MG tablet Take 200 mg by mouth every 6 (six) hours as needed for fever.    [provider]  lubiprostone (AMITIZA) 24 MCG capsule Take 1 capsule (24 mcg total) by mouth 2 (two) times daily with a meal. Patient not taking: Reported on 12/03/2020 09/11/19   Carlis Stable, NP  pantoprazole (PROTONIX) 40 MG tablet Take one tablet once to twice daily before meal for reflux. Take lowest effective dose. Patient not taking: Reported on 12/03/2020 12/27/19   Mahala Menghini, PA-C  polyethylene glycol-electrolytes (TRILYTE) 420 g solution Take 4,000 mLs by mouth as directed. Patient not taking: Reported on 12/03/2020 09/03/19   Danie Binder, MD  Potassium Chloride ER 20 MEQ TBCR Take 1 tablet by mouth daily. Patient not taking: Reported on 12/03/2020 09/17/20   [provider]  potassium chloride SA (KLOR-CON) 20 MEQ tablet Take 1 tablet (20 mEq total) by mouth 2 (two) times daily for 3 days. 09/09/20 09/12/20  Maudie Flakes, MD  QUEtiapine (SEROQUEL) 400 MG tablet Take 1 tablet (400 mg total) by mouth at bedtime. Patient taking differently: Take 800 mg by mouth at bedtime. 06/06/18   Evalee Jefferson, PA-C  rosuvastatin (CRESTOR) 40 MG tablet Take 40 mg by mouth every morning.  02/10/16   [provider]  venlafaxine XR (EFFEXOR-XR) 150 MG 24 hr capsule Take 150 mg by mouth daily. Patient not taking: Reported on 12/03/2020 08/06/19   [provider]  venlafaxine XR (EFFEXOR-XR) 75 MG 24 hr capsule Take 75 mg by mouth daily. Patient not taking: Reported on 12/03/2020 08/07/19   [provider]  hydrochlorothiazide (HYDRODIURIL) 25 MG tablet Take 25 mg by mouth every morning.  02/11/16 12/06/20  [provider]  linaclotide  Rolan Lipa) 72 MCG capsule Take 1 capsule (72 mcg total) by mouth daily before breakfast. Patient not taking: No sig reported 07/27/19 12/06/20  Carlis Stable, NP  metoprolol succinate (TOPROL-XL) 50 MG 24 hr tablet Take 50 mg by mouth every morning. Take with or immediately following a meal.  12/06/20  [provider]  sertraline (ZOLOFT) 100 MG tablet Take 0.5 tablets (50 mg total) by mouth daily. Patient not taking: Reported on 12/03/2020 06/06/18 12/06/20  Evalee Jefferson, PA-C    Allergies    Dimenhydrinate  Review of Systems   Review of Systems  All other systems reviewed and are negative.   Physical Exam Updated Vital Signs BP 111/82  Pulse 99   Temp 98.2 F (36.8 C) (Oral)   Resp 19   SpO2 94%   Physical Exam Vitals and nursing note reviewed.  Constitutional:      General: She is not in acute distress.    Appearance: She is well-developed.     Comments: Anxious appearing  HENT:     Head: Normocephalic and atraumatic.     Mouth/Throat:     Pharynx: No oropharyngeal exudate.  Eyes:     General: No scleral icterus.       Right eye: No discharge.        Left eye: No discharge.     Conjunctiva/sclera: Conjunctivae normal.     Pupils: Pupils are equal, round, and reactive to light.  Neck:     Thyroid: No thyromegaly.     Vascular: No JVD.  Cardiovascular:     Rate and Rhythm: Regular rhythm. Tachycardia present.     Heart sounds: Normal heart sounds. No murmur heard. No friction rub. No gallop.   Pulmonary:     Effort: Pulmonary effort is normal. No respiratory distress.     Breath sounds: Normal breath sounds. No wheezing or rales.  Chest:     Chest wall: Tenderness present.  Abdominal:     General: Bowel sounds are normal. There is no distension.     Palpations: Abdomen is soft. There is no mass.     Tenderness: There is no abdominal tenderness.  Musculoskeletal:        General: No tenderness. Normal range of motion.     Cervical back: Normal range of  motion and neck supple.     Right lower leg: No edema.     Left lower leg: No edema.  Lymphadenopathy:     Cervical: No cervical adenopathy.  Skin:    General: Skin is warm and dry.     Findings: No erythema or rash.  Neurological:     Mental Status: She is alert.     Coordination: Coordination normal.  Psychiatric:     Comments: The patient has no depression, she is not responding to any internal stimuli, no hallucinations, no suicidal thoughts, she does not feel particularly depressed but is very anxious about her chest pain and her symptoms     ED Results / Procedures / Treatments   Labs (all labs ordered are listed, but only abnormal results are displayed) Labs Reviewed - No data to display  EKG None  Radiology No results found.  Procedures Procedures   Medications Ordered in ED Medications  metoprolol tartrate (LOPRESSOR) tablet 50 mg (0 mg Oral Hold 12/06/20 2121)  metoprolol tartrate (LOPRESSOR) injection 5 mg (5 mg Intravenous Given 12/06/20 2121)  LORazepam (ATIVAN) tablet 1 mg (1 mg Oral Given 12/06/20 2115)  ketorolac (TORADOL) 30 MG/ML injection 30 mg (30 mg Intravenous Given 12/06/20 2115)    ED Course  I have reviewed the triage vital signs and the nursing notes.  Pertinent labs & imaging results that were available during my care of the patient were reviewed by me and considered in my medical decision making (see chart for details).    MDM Rules/Calculators/A&P                          At this time the patient has tachycardia to a rate of about 135 bpm, strong pulses, no JVD, no peripheral edema, no murmur.  She is not having any shortness of breath, her lung  sounds are normal, she has reproducible tenderness over the left side of the chest, chaperone present for the entire exam  I do not feel the need to repeat the labs for symptoms that are exactly reproducible compared to several days ago.  I do not think she has a pneumothorax, this is likely related to  anxiety, and is certainly not cardiac and there is no cardiac or exertional chest pain.  She has no fevers or coughing to suggest pneumonia and I doubt that this is an aortic dissection.  She has however hypertensive and not on any beta-blockers which I would suspect would help with the tachycardia at baseline.  I will refer to cardiology, start metoprolol, change from hydrochlorothiazide.  The patient is agreeable  She does seem to perseverate on the cause of her CP and the need for pain medicine throughout the interview.  At this time the patient's heart rate is come down to 91 she no longer has chest pain she does not feel anxious, she was given 5 mg of Lopressor, she was not given any oral medications.  Her blood pressure is normal, she feels good, she will need referral to cardiology as an outpatient and has a follow-up with her doctor on 7 June.  She is stable for discharge and agrees to the plan    Final Clinical Impression(s) / ED Diagnoses Final diagnoses:  Left-sided chest pain    Rx / DC Orders ED Discharge Orders         Ordered    metoprolol tartrate (LOPRESSOR) 50 MG tablet  2 times daily        12/06/20 2209    Ambulatory referral to Cardiology        12/06/20 2210           Noemi Chapel, MD 12/06/20 2210

## 2020-12-06 NOTE — ED Triage Notes (Signed)
Pt with CP and palpitations since this morning.  C/o dizziness. Pt states she has been here for same multiple times.

## 2021-01-21 NOTE — Progress Notes (Signed)
CARDIOLOGY CONSULT NOTE       Patient ID: Sherry [name] MRN: [id number] DOB/AGE: Apr 14, 1971 50 y.o.  Admit date: (Not on file) Referring Physician: [facility]  Primary Physician: Abran Richard, MD Primary Cardiologist: New Reason for Consultation: Chest pain  Active Problems:   * No active hospital problems. *   HPI:  50 y.o. referred by Noemi Chapel MD [facility] ER for chest pain. Seen there 12/06/20. She has anxiety , depression, bipolar disorder and COPD still smoking 1/2 ppd  She has been seen numerous times in ED for atypical chest pain And prescribed etodolac Sharp radiates to left shoulder blade Worse with pain to palpation She feels pain Is associated with tachycardia She always r/o, negative d dimer no abnormality on ECGls or CXR She smokes marijuana on occasion She was d/c with lopressor 50 mg bid She did have a high resolution CT chest 10/14/20 shtat showed reticular gound glass changes ? Bronchiolitis There was note on this scan of aortic atherosclerosis and coronary calcification   She also has positive family history of CAD. She indicates feeling some better on lopressor but has no refills   ROS All other systems reviewed and negative except as noted above  Past Medical History:  Diagnosis Date   COPD (chronic obstructive pulmonary disease) (HCC)    Depression    High cholesterol    Hypertension    Lung collapse    left    Family History  Problem Relation Age of Onset   Colon polyps Mother    Colon cancer Maternal Grandmother    Colon cancer Maternal Grandfather    Colon cancer Paternal Grandmother    Colon cancer Paternal Grandfather    Colon cancer Paternal Uncle     Social History   Socioeconomic History   Marital status: Divorced    Spouse name: Not on file   Number of children: Not on file   Years of education: Not on file   Highest education level: Not on file  Occupational History   Not on file  Tobacco Use   Smoking status: Every Day     Packs/day: 0.50    Types: Cigarettes   Smokeless tobacco: Never  Vaping Use   Vaping Use: Never used  Substance and Sexual Activity   Alcohol use: Yes    Comment: occasionally    Drug use: Never   Sexual activity: Not on file  Other Topics Concern   Not on file  Social History Narrative   Not on file   Social Determinants of Health   Financial Resource Strain: Not on file  Food Insecurity: Not on file  Transportation Needs: Not on file  Physical Activity: Not on file  Stress: Not on file  Social Connections: Not on file  Intimate Partner Violence: Not on file    Past Surgical History:  Procedure Laterality Date   CHEST TUBE INSERTION     ENDOMETRIAL ABLATION        Current Outpatient Medications:    acetaminophen (TYLENOL) 650 MG CR tablet, Take 650 mg by mouth every 8 (eight) hours as needed for pain. , Disp: , Rfl:    aspirin EC 81 MG tablet, Take 81 mg by mouth daily. Swallow whole., Disp: , Rfl:    clonazePAM (KLONOPIN) 0.5 MG tablet, Take 0.5 mg by mouth 2 (two) times daily as needed for anxiety., Disp: , Rfl:    etodolac (LODINE) 300 MG capsule, Take 1 capsule (300 mg total) by mouth every  8 (eight) hours., Disp: 21 capsule, Rfl: 0   ibuprofen (ADVIL) 100 MG tablet, Take 200 mg by mouth every 6 (six) hours as needed for fever., Disp: , Rfl:    lubiprostone (AMITIZA) 24 MCG capsule, Take 1 capsule (24 mcg total) by mouth 2 (two) times daily with a meal., Disp: 60 capsule, Rfl: 3   metoprolol tartrate (LOPRESSOR) 50 MG tablet, Take 0.5 tablets (25 mg total) by mouth 2 (two) times daily., Disp: 60 tablet, Rfl: 1   QUEtiapine (SEROQUEL) 400 MG tablet, Take 1 tablet (400 mg total) by mouth at bedtime., Disp: 10 tablet, Rfl: 0   rosuvastatin (CRESTOR) 40 MG tablet, Take 40 mg by mouth every morning. , Disp: , Rfl:    venlafaxine XR (EFFEXOR-XR) 150 MG 24 hr capsule, Take 150 mg by mouth daily., Disp: , Rfl:    potassium chloride SA (KLOR-CON) 20 MEQ tablet, Take 1 tablet  (20 mEq total) by mouth 2 (two) times daily for 3 days., Disp: 6 tablet, Rfl: 0    Physical Exam: There were no vitals taken for this visit.   Affect appropriate Healthy:  appears stated age 71: normal Neck supple with no adenopathy JVP normal no bruits no thyromegaly Lungs clear with no wheezing and good diaphragmatic motion Heart:  S1/S2 no murmur, no rub, gallop or click PMI normal Abdomen: benighn, BS positve, no tenderness, no AAA no bruit.  No HSM or HJR Distal pulses intact with no bruits No edema Neuro non-focal Skin warm and dry No muscular weakness   Labs:   Lab Results  Component Value Date   WBC 8.9 12/03/2020   HGB 15.6 (H) 12/03/2020   HCT 48.2 (H) 12/03/2020   MCV 97.8 12/03/2020   PLT 347 12/03/2020   No results for input(s): NA, K, CL, CO2, BUN, CREATININE, CALCIUM, PROT, BILITOT, ALKPHOS, ALT, AST, GLUCOSE in the last 168 hours.  Invalid input(s): LABALBU Lab Results  Component Value Date   TROPONINI <0.03 12/02/2018   No results found for: CHOL No results found for: HDL No results found for: LDLCALC No results found for: TRIG No results found for: CHOLHDL No results found for: LDLDIRECT    Radiology: No results found.  EKG: ST rate 145 normal otherwise 12/09/20 01/29/2021 NSR normal ECG    ASSESSMENT AND PLAN:   Chest Pain: atypical but numerous ER visits and need for definitive diagnosis Noted coronary calcium and aortic atherosclerosis on CT Continue lopressor , ASA  Shared decision making regarding cardiac CTA vs myovue vs cath Prefer cardiac CTA with calcium score  HLD:  continue statin labs with primary COPD:  should have f/u with pulmonary given abnormal chest CT in April May be developing ILD Bipolar:  continue klonopin , Seroquel and Effexor these meds may contribute to her tachycardia  BMET Lopressor 25 bid and 100 mg prior to scan  Cardiac CTA F/U PRN if low risk   Signed: Jenkins Rouge 01/29/2021, 1:56 PM

## 2021-01-29 ENCOUNTER — Other Ambulatory Visit (HOSPITAL_COMMUNITY)
Admission: RE | Admit: 2021-01-29 | Discharge: 2021-01-29 | Disposition: A | Discharge status: Home / Self Care | Payer: Medicaid Other | Source: Ambulatory Visit | Attending: Cardiovascular Disease | Admitting: Cardiovascular Disease

## 2021-01-29 ENCOUNTER — Other Ambulatory Visit: Payer: Self-pay

## 2021-01-29 ENCOUNTER — Encounter: Payer: Self-pay | Admitting: Cardiovascular Disease

## 2021-01-29 ENCOUNTER — Encounter (INDEPENDENT_AMBULATORY_CARE_PROVIDER_SITE_OTHER): Payer: Self-pay

## 2021-01-29 ENCOUNTER — Ambulatory Visit (INDEPENDENT_AMBULATORY_CARE_PROVIDER_SITE_OTHER): Payer: Self-pay | Admitting: Cardiovascular Disease

## 2021-01-29 VITALS — BP 118/80 | HR 89 | Ht 65.0 in | Wt 181.2 lb

## 2021-01-29 DIAGNOSIS — J431 Panlobular emphysema: Secondary | ICD-10-CM

## 2021-01-29 DIAGNOSIS — E782 Mixed hyperlipidemia: Secondary | ICD-10-CM

## 2021-01-29 DIAGNOSIS — R079 Chest pain, unspecified: Secondary | ICD-10-CM

## 2021-01-29 DIAGNOSIS — F319 Bipolar disorder, unspecified: Secondary | ICD-10-CM

## 2021-01-29 LAB — BASIC METABOLIC PANEL
Anion gap: 10 (ref 5–15)
BUN: 8 mg/dL (ref 6–20)
CO2: 26 mmol/L (ref 22–32)
Calcium: 8.8 mg/dL — ABNORMAL LOW (ref 8.9–10.3)
Chloride: 103 mmol/L (ref 98–111)
Creatinine, Ser: 0.83 mg/dL (ref 0.44–1.00)
GFR, Estimated: >60 mL/min (ref >60)
Glucose, Bld: 91 mg/dL (ref 70–99)
Potassium: 4.5 mmol/L (ref 3.5–5.1)
Sodium: 139 mmol/L (ref 135–145)

## 2021-01-29 NOTE — Patient Instructions (Signed)
Medication Instructions:  Your physician recommends that you continue on your current medications as directed. Please refer to the Current Medication list given to you today.  Take 100 mg Lopressor 2 hours prior to CT Scan   *If you need a refill on your cardiac medications before your next appointment, please call your pharmacy*   Lab Work: Your physician recommends that you return for lab work today.   If you have labs (blood work) drawn today and your tests are completely normal, you will receive your results only by: [place] (if you have MyChart) OR A paper copy in the mail If you have any lab test that is abnormal or we need to change your treatment, we will call you to review the results.   Testing/Procedures: Cardiac CTA    Follow-Up: At [facility], you and your health needs are our priority.  As part of our continuing mission to provide you with exceptional heart care, we have created designated Provider Care Teams.  These Care Teams include your primary Cardiologist (physician) and Advanced Practice Providers (APPs -  Physician Assistants and Nurse Practitioners) who all work together to provide you with the care you need, when you need it.  We recommend signing up for the patient portal called "MyChart".  Sign up information is provided on this After Visit Summary.  MyChart is used to connect with patients for Virtual Visits (Telemedicine).  Patients are able to view lab/test results, encounter notes, upcoming appointments, etc.  Non-urgent messages can be sent to your provider as well.   To learn more about what you can do with MyChart, go to [email or web address].    Your next appointment:   As Needed   The format for your next appointment:   In Person  Provider:   Jenkins Rouge, MD   Other Instructions Thank you for choosing [facility]!      Your cardiac CT will be scheduled at one of the below locations:   [facility] [address] [place], [place] [address] [phone]  [facility] [address] [place], [place] [address] [phone]  If scheduled at [facility], please arrive at the [facility] main entrance (entrance A) of [facility] 30 minutes prior to test start time. Proceed to the [facility] Radiology Department (first floor) to check-in and test prep.  If scheduled at [facility], please arrive 15 mins early for check-in and test prep.  Please follow these instructions carefully (unless otherwise directed):  On the Night Before the Test: Be sure to Drink plenty of water. Do not consume any caffeinated/decaffeinated beverages or chocolate 12 hours prior to your test. Do not take any antihistamines 12 hours prior to your test.  On the Day of the Test: Drink plenty of water until 1 hour prior to the test. Do not eat any food 4 hours prior to the test. You may take your regular medications prior to the test.  Take metoprolol (Lopressor) two hours prior to test. HOLD Furosemide/Hydrochlorothiazide morning of the test. FEMALES- please wear underwire-free bra if available, avoid dresses & tight clothing   *For Clinical Staff only. Please instruct patient the following:* Heart Rate Medication Recommendations for Cardiac CT  Resting HR < 50 bpm  No medication  Resting HR 50-60 bpm and BP >110/50 mmHG   Consider Metoprolol tartrate 25 mg PO 90-120 min prior to scan  Resting HR 60-65 bpm and  BP >110/50 mmHG  Metoprolol tartrate 50 mg PO 90-120 minutes prior to scan   Resting HR > 65 bpm and BP >110/50 mmHG  Metoprolol tartrate 100 mg PO 90-120 minutes prior to scan  Consider Ivabradine 10-15 mg PO or a calcium channel blocker for resting HR >60 bpm and contraindication to metoprolol tartrate  Consider Ivabradine 10-15 mg PO in combination with metoprolol tartrate for HR >80 bpm         After  the Test: Drink plenty of water. After receiving IV contrast, you may experience a mild flushed feeling. This is normal. On occasion, you may experience a mild rash up to 24 hours after the test. This is not dangerous. If this occurs, you can take Benadryl 25 mg and increase your fluid intake. If you experience trouble breathing, this can be serious. If it is severe call 911 IMMEDIATELY. If it is mild, please call our office. If you take any of these medications: Glipizide/Metformin, Avandament, Glucavance, please do not take 48 hours after completing test unless otherwise instructed.  Please allow 2-4 weeks for scheduling of routine cardiac CTs. Some insurance companies require a pre-authorization which may delay scheduling of this test.   For non-scheduling related questions, please contact the cardiac imaging nurse navigator should you have any questions/concerns: Marchia Bond, Cardiac Imaging Nurse Navigator Gordy Clement, Cardiac Imaging Nurse Navigator [facility] Direct Office Dial: [phone]   For scheduling needs, including cancellations and rescheduling, please call [place], [phone].

## 2021-01-30 ENCOUNTER — Telehealth (HOSPITAL_COMMUNITY): Payer: Self-pay | Admitting: *Deleted

## 2021-01-30 NOTE — Telephone Encounter (Signed)
Reaching out to patient to offer assistance regarding upcoming cardiac imaging study; pt verbalizes understanding of appt date/time, parking situation and where to check in, pre-test NPO status and medications ordered, and verified current allergies; name and call back number provided for further questions should they arise  Gordy Clement RN Navigator Cardiac Imaging [facility] [phone] office [phone] cell  Patient to take '100mg'$  metoprolol tartrate 2 hours prior to cardiac CT.

## 2021-02-02 ENCOUNTER — Other Ambulatory Visit: Payer: Self-pay

## 2021-02-02 ENCOUNTER — Encounter (HOSPITAL_COMMUNITY): Payer: Self-pay

## 2021-02-02 ENCOUNTER — Ambulatory Visit (HOSPITAL_COMMUNITY)
Admission: RE | Admit: 2021-02-02 | Discharge: 2021-02-02 | Disposition: A | Discharge status: Home / Self Care | Payer: Self-pay | Source: Ambulatory Visit | Attending: Cardiovascular Disease | Admitting: Cardiovascular Disease

## 2021-02-02 DIAGNOSIS — R079 Chest pain, unspecified: Secondary | ICD-10-CM | POA: Insufficient documentation

## 2021-02-02 MED ORDER — METOPROLOL TARTRATE 5 MG/5ML IV SOLN: 10.0000 mg | Freq: Once | INTRAVENOUS | Status: AC

## 2021-02-02 MED ORDER — DILTIAZEM HCL 25 MG/5ML IV SOLN
INTRAVENOUS | Status: AC
  Administered 2021-02-02: 10 mg via INTRAVENOUS
  Filled 2021-02-02: qty 5

## 2021-02-02 MED ORDER — NITROGLYCERIN 0.4 MG SL SUBL: 0.8000 mg | SUBLINGUAL_TABLET | Freq: Once | SUBLINGUAL | Status: AC

## 2021-02-02 MED ORDER — IOHEXOL 350 MG/ML SOLN
95.0000 mL | Freq: Once | INTRAVENOUS | Status: AC | PRN
  Administered 2021-02-02: 95 mL via INTRAVENOUS

## 2021-02-02 MED ORDER — DILTIAZEM HCL 25 MG/5ML IV SOLN: 10.0000 mg | Freq: Once | INTRAVENOUS | Status: AC

## 2021-02-02 MED ORDER — METOPROLOL TARTRATE 5 MG/5ML IV SOLN
INTRAVENOUS | Status: AC
  Administered 2021-02-02: 10 mg via INTRAVENOUS
  Filled 2021-02-02: qty 10

## 2021-02-02 MED ORDER — NITROGLYCERIN 0.4 MG SL SUBL
SUBLINGUAL_TABLET | SUBLINGUAL | Status: AC
  Administered 2021-02-02: 0.8 mg via SUBLINGUAL
  Filled 2021-02-02: qty 2

## 2021-04-29 ENCOUNTER — Telehealth: Payer: Self-pay | Admitting: Cardiovascular Disease

## 2021-04-29 MED ORDER — METOPROLOL TARTRATE 50 MG PO TABS: 25.0000 mg | ORAL_TABLET | Freq: Two times a day (BID) | ORAL | 3 refills | Status: DC

## 2021-04-29 NOTE — Telephone Encounter (Signed)
Refill sent to [facility] in [place]

## 2021-04-29 NOTE — Telephone Encounter (Signed)
New message     *STAT* If patient is at the pharmacy, call can be transferred to refill team.   1. Which medications need to be refilled? (please list name of each medication and dose if known) metoprolol tartrate (LOPRESSOR) 50 MG tablet  2. Which pharmacy/location (including street and city if local pharmacy) is medication to be sent to? [facility] [place]  3. Do they need a 30 day or 90 day supply? [facility]

## 2021-05-25 ENCOUNTER — Encounter (HOSPITAL_COMMUNITY): Payer: Self-pay | Admitting: *Deleted

## 2021-05-25 ENCOUNTER — Emergency Department (HOSPITAL_COMMUNITY)
Admission: EM | Admit: 2021-05-25 | Discharge: 2021-05-25 | Disposition: A | Discharge status: Home / Self Care | Payer: BC Managed Care – PPO | Attending: Emergency Medicine | Admitting: Emergency Medicine

## 2021-05-25 ENCOUNTER — Emergency Department (HOSPITAL_COMMUNITY): Payer: BC Managed Care – PPO

## 2021-05-25 DIAGNOSIS — R059 Cough, unspecified: Secondary | ICD-10-CM

## 2021-05-25 DIAGNOSIS — Z79899 Other long term (current) drug therapy: Secondary | ICD-10-CM | POA: Diagnosis not present

## 2021-05-25 DIAGNOSIS — Z20822 Contact with and (suspected) exposure to covid-19: Secondary | ICD-10-CM | POA: Insufficient documentation

## 2021-05-25 DIAGNOSIS — R9389 Abnormal findings on diagnostic imaging of other specified body structures: Secondary | ICD-10-CM

## 2021-05-25 DIAGNOSIS — R918 Other nonspecific abnormal finding of lung field: Secondary | ICD-10-CM | POA: Insufficient documentation

## 2021-05-25 DIAGNOSIS — I10 Essential (primary) hypertension: Secondary | ICD-10-CM | POA: Diagnosis not present

## 2021-05-25 DIAGNOSIS — Z7982 Long term (current) use of aspirin: Secondary | ICD-10-CM | POA: Insufficient documentation

## 2021-05-25 DIAGNOSIS — J449 Chronic obstructive pulmonary disease, unspecified: Secondary | ICD-10-CM | POA: Diagnosis not present

## 2021-05-25 DIAGNOSIS — J069 Acute upper respiratory infection, unspecified: Secondary | ICD-10-CM | POA: Insufficient documentation

## 2021-05-25 DIAGNOSIS — F17210 Nicotine dependence, cigarettes, uncomplicated: Secondary | ICD-10-CM | POA: Diagnosis not present

## 2021-05-25 LAB — RESP PANEL BY RT-PCR (FLU A&B, COVID) ARPGX2
Influenza A by PCR: NEGATIVE
Influenza B by PCR: NEGATIVE
SARS Coronavirus 2 by RT PCR: NEGATIVE

## 2021-05-25 MED ORDER — ONDANSETRON 4 MG PO TBDP
4.0000 mg | ORAL_TABLET | Freq: Once | ORAL | Status: AC
  Administered 2021-05-25: 4 mg via ORAL
  Filled 2021-05-25: qty 1

## 2021-05-25 MED ORDER — AMOXICILLIN-POT CLAVULANATE 875-125 MG PO TABS: 1.0000 | ORAL_TABLET | Freq: Two times a day (BID) | ORAL | 0 refills | Status: DC

## 2021-05-25 MED ORDER — ONDANSETRON 4 MG PO TBDP: 4.0000 mg | ORAL_TABLET | Freq: Three times a day (TID) | ORAL | 0 refills | Status: DC | PRN

## 2021-05-25 MED ORDER — AZITHROMYCIN 250 MG PO TABS: 250.0000 mg | ORAL_TABLET | Freq: Every day | ORAL | 0 refills | Status: DC

## 2021-05-25 MED ORDER — METOCLOPRAMIDE HCL 10 MG PO TABS
10.0000 mg | ORAL_TABLET | Freq: Once | ORAL | Status: AC
  Administered 2021-05-25: 10 mg via ORAL
  Filled 2021-05-25: qty 1

## 2021-05-25 MED ORDER — ACETAMINOPHEN 325 MG PO TABS
650.0000 mg | ORAL_TABLET | Freq: Once | ORAL | Status: AC
  Administered 2021-05-25: 650 mg via ORAL
  Filled 2021-05-25: qty 2

## 2021-05-25 MED ORDER — ALBUTEROL SULFATE HFA 108 (90 BASE) MCG/ACT IN AERS
2.0000 | INHALATION_SPRAY | Freq: Once | RESPIRATORY_TRACT | Status: AC
  Administered 2021-05-25: 2 via RESPIRATORY_TRACT
  Filled 2021-05-25: qty 6.7

## 2021-05-25 NOTE — Discharge Instructions (Signed)
Take antibiotics as prescribed. Take the entire course, even if symptoms improve.  Use the inhaler every 4 hours while awake for the next 2 days. After this, use as needed.  Use zofran as needed for nausea or vomiting.  Follow up with your primary care doctor for recheck of symptoms and further review of your abnormal chest xray.  Return to the ER with any new, worsening, or concerning symptoms.

## 2021-05-25 NOTE — ED Triage Notes (Signed)
Cough, sneezing  and vomiting , intermittent for 3 days

## 2021-05-25 NOTE — ED Provider Notes (Signed)
[facility] Provider Note   CSN: [id number] Arrival date & time: 05/25/21  1038     History Chief Complaint  Patient presents with   FLU SYMPTOMS    Sherry [name] is a 50 y.o. female presenting for evaluation of headache, cough, congestion, nausea.    Patient states she has been ill for 3 days.  She reports a dry, nonproductive cough.  She reports feeling feverish, but has not checked.  Associated nasal congestion and nausea.  She has a history of COPD, says she is supposed to have an inhaler but does not currently due to lack of insurance.  She denies sick contacts.  She is not vaccinated for flu.  She has been tested twice at home for COVID both have been negative.  She denies abdominal pain or diarrhea.  No significant shortness of breath.  She has taken TheraFlu and ibuprofen without improvement of symptoms, has not taken anything else.  Nothing makes her sxs better or worse  HPI     Past Medical History:  Diagnosis Date   COPD (chronic obstructive pulmonary disease) ([place])    Depression    High cholesterol    Hypertension    Lung collapse    left    Patient Active Problem List   Diagnosis Date Noted   Preventative health care 06/21/2019   GERD (gastroesophageal reflux disease) 06/21/2019   Constipation 06/21/2019    Past Surgical History:  Procedure Laterality Date   CHEST TUBE INSERTION     ENDOMETRIAL ABLATION       OB History   No obstetric history on file.     Family History  Problem Relation Age of Onset   Colon polyps Mother    Colon cancer Maternal Grandmother    Colon cancer Maternal Grandfather    Colon cancer Paternal Grandmother    Colon cancer Paternal Grandfather    Colon cancer Paternal Uncle     Social History   Tobacco Use   Smoking status: Every Day    Packs/day: 0.50    Types: Cigarettes   Smokeless tobacco: Never  Vaping Use   Vaping Use: Never used  Substance Use Topics   Alcohol use: Yes    Comment:  occasionally    Drug use: Never    Home Medications Prior to Admission medications   Medication Sig Start Date End Date Taking? Authorizing Provider  amoxicillin-clavulanate (AUGMENTIN) 875-125 MG tablet Take 1 tablet by mouth every 12 (twelve) hours. 05/25/21  Yes Laxmi Choung, PA-C  azithromycin (ZITHROMAX) 250 MG tablet Take 1 tablet (250 mg total) by mouth daily. Take first 2 tablets together, then 1 every day until finished. 05/25/21  Yes Ronnesha Mester, PA-C  ondansetron (ZOFRAN ODT) 4 MG disintegrating tablet Take 1 tablet (4 mg total) by mouth every 8 (eight) hours as needed for nausea or vomiting. 05/25/21  Yes Jabriel Vanduyne, PA-C  acetaminophen (TYLENOL) 650 MG CR tablet Take 650 mg by mouth every 8 (eight) hours as needed for pain.     [provider]  aspirin EC 81 MG tablet Take 81 mg by mouth daily. Swallow whole.    [provider]  clonazePAM (KLONOPIN) 0.5 MG tablet Take 0.5 mg by mouth 2 (two) times daily as needed for anxiety.    [provider]  etodolac (LODINE) 300 MG capsule Take 1 capsule (300 mg total) by mouth every 8 (eight) hours. 12/03/20   Dorie Rank, MD  ibuprofen (ADVIL) 100 MG tablet Take 200  mg by mouth every 6 (six) hours as needed for fever.    [provider]  lubiprostone (AMITIZA) 24 MCG capsule Take 1 capsule (24 mcg total) by mouth 2 (two) times daily with a meal. 09/11/19   Carlis Stable, NP  metoprolol tartrate (LOPRESSOR) 50 MG tablet Take 0.5 tablets (25 mg total) by mouth 2 (two) times daily. 04/29/21   Josue Hector, MD  potassium chloride SA (KLOR-CON) 20 MEQ tablet Take 1 tablet (20 mEq total) by mouth 2 (two) times daily for 3 days. 09/09/20 09/12/20  Maudie Flakes, MD  QUEtiapine (SEROQUEL) 400 MG tablet Take 1 tablet (400 mg total) by mouth at bedtime. 06/06/18   Evalee Jefferson, PA-C  rosuvastatin (CRESTOR) 40 MG tablet Take 40 mg by mouth every morning.  02/10/16   [provider]  venlafaxine XR  (EFFEXOR-XR) 150 MG 24 hr capsule Take 150 mg by mouth daily. 08/06/19   [provider]  hydrochlorothiazide (HYDRODIURIL) 25 MG tablet Take 25 mg by mouth every morning.  02/11/16 12/06/20  [provider]  linaclotide Rolan Lipa) 72 MCG capsule Take 1 capsule (72 mcg total) by mouth daily before breakfast. Patient not taking: No sig reported 07/27/19 12/06/20  Carlis Stable, NP  metoprolol succinate (TOPROL-XL) 50 MG 24 hr tablet Take 50 mg by mouth every morning. Take with or immediately following a meal.  12/06/20  [provider]  sertraline (ZOLOFT) 100 MG tablet Take 0.5 tablets (50 mg total) by mouth daily. Patient not taking: Reported on 12/03/2020 06/06/18 12/06/20  Evalee Jefferson, PA-C    Allergies    Dimenhydrinate  Review of Systems   Review of Systems  Constitutional:  Positive for fever.  HENT:  Positive for congestion.   Respiratory:  Positive for cough.   Gastrointestinal:  Positive for nausea.  All other systems reviewed and are negative.  Physical Exam Updated Vital Signs BP (!) 132/92 (BP Location: Right Arm)   Pulse 73   Temp 98 F (36.7 C) (Oral)   Resp 18   SpO2 97%   Physical Exam Vitals and nursing note reviewed.  Constitutional:      General: She is not in acute distress.    Appearance: Normal appearance.     Comments: nontoxic  HENT:     Head: Normocephalic and atraumatic.  Eyes:     Conjunctiva/sclera: Conjunctivae normal.     Pupils: Pupils are equal, round, and reactive to light.  Cardiovascular:     Rate and Rhythm: Normal rate and regular rhythm.     Pulses: Normal pulses.  Pulmonary:     Effort: Pulmonary effort is normal. No respiratory distress.     Breath sounds: Normal breath sounds. No wheezing.     Comments: Speaking in full sentences.  Clear lung sounds in all fields. Abdominal:     General: There is no distension.     Palpations: Abdomen is soft. There is no mass.     Tenderness: There is no abdominal tenderness.  There is no guarding or rebound.  Musculoskeletal:        General: Normal range of motion.     Cervical back: Normal range of motion and neck supple.  Skin:    General: Skin is warm and dry.     Capillary Refill: Capillary refill takes less than 2 seconds.  Neurological:     Mental Status: She is alert and oriented to person, place, and time.  Psychiatric:  Mood and Affect: Mood and affect normal.        Speech: Speech normal.        Behavior: Behavior normal.    ED Results / Procedures / Treatments   Labs (all labs ordered are listed, but only abnormal results are displayed) Labs Reviewed  RESP PANEL BY RT-PCR (FLU A&B, COVID) [id number]    EKG None  Radiology DG Chest Port 1 View  Result Date: 05/25/2021 CLINICAL DATA:  Cough, vomiting EXAM: PORTABLE CHEST 1 VIEW COMPARISON:  12/03/2020 chest radiograph. FINDINGS: Stable cardiomediastinal silhouette with normal heart size. No pneumothorax. No pleural effusion. No acute consolidative airspace disease. No pulmonary edema. Faint patchy reticular opacities in the lower lungs are not definitely changed. IMPRESSION: 1. No acute cardiopulmonary disease. 2. Stable faint patchy reticular opacities in the lower lungs, please see the 10/14/2020 high-resolution chest CT for further discussion. Electronically Signed   By: Ilona Sorrel M.D.   On: 05/25/2021 12:54    Procedures Procedures   Medications Ordered in ED Medications  acetaminophen (TYLENOL) tablet 650 mg (650 mg Oral Given 05/25/21 1239)  ondansetron (ZOFRAN-ODT) disintegrating tablet 4 mg (4 mg Oral Given 05/25/21 1239)  metoCLOPramide (REGLAN) tablet 10 mg (10 mg Oral Given 05/25/21 1345)  albuterol (VENTOLIN HFA) 108 (90 Base) MCG/ACT inhaler 2 puff (2 puffs Inhalation Given 05/25/21 1345)    ED Course  I have reviewed the triage vital signs and the nursing notes.  Pertinent labs & imaging results that were available during my care of the patient were reviewed by me  and considered in my medical decision making (see chart for details).    MDM Rules/Calculators/A&P                           Patient presenting with 3 day h/o uri symptoms.  Physical exam reassuring, patient is afebrile and appears nontoxic.  Will obtain respiratory panel.  Due to her history, also consider pneumonia, chest x-ray ordered.  Chest was reviewed and independently interpreted by me, shows haziness in the bases.  Per radiology, this is similar to CT read from several months ago.  However per last x-ray, this appears worsened.  As such, will start her on antibiotics and have her follow-up closely with her PCP.  At this time, patient appears safe for discharge.  Return precautions given.  Patient states she understands and agrees to plan.  Final Clinical Impression(s) / ED Diagnoses Final diagnoses:  Upper respiratory tract infection, unspecified type  Abnormal CXR    Rx / DC Orders ED Discharge Orders          Ordered    amoxicillin-clavulanate (AUGMENTIN) 875-125 MG tablet  Every 12 hours        05/25/21 1331    azithromycin (ZITHROMAX) 250 MG tablet  Daily        05/25/21 1331    ondansetron (ZOFRAN ODT) 4 MG disintegrating tablet  Every 8 hours PRN        05/25/21 1331             Bodie Abernethy, PA-C 05/25/21 1352    Varney Biles, MD 05/27/21 1233

## 2021-08-06 ENCOUNTER — Emergency Department (HOSPITAL_COMMUNITY)
Admission: EM | Admit: 2021-08-06 | Discharge: 2021-08-07 | Disposition: A | Discharge status: Home / Self Care | Payer: BC Managed Care – PPO | Attending: Emergency Medicine | Admitting: Emergency Medicine

## 2021-08-06 ENCOUNTER — Emergency Department (HOSPITAL_COMMUNITY): Payer: BC Managed Care – PPO

## 2021-08-06 ENCOUNTER — Encounter (HOSPITAL_COMMUNITY): Payer: Self-pay | Admitting: *Deleted

## 2021-08-06 DIAGNOSIS — R1010 Upper abdominal pain, unspecified: Secondary | ICD-10-CM

## 2021-08-06 DIAGNOSIS — Z79899 Other long term (current) drug therapy: Secondary | ICD-10-CM | POA: Insufficient documentation

## 2021-08-06 DIAGNOSIS — Z7982 Long term (current) use of aspirin: Secondary | ICD-10-CM | POA: Insufficient documentation

## 2021-08-06 DIAGNOSIS — M546 Pain in thoracic spine: Secondary | ICD-10-CM | POA: Diagnosis not present

## 2021-08-06 DIAGNOSIS — R1012 Left upper quadrant pain: Secondary | ICD-10-CM | POA: Diagnosis present

## 2021-08-06 DIAGNOSIS — D72829 Elevated white blood cell count, unspecified: Secondary | ICD-10-CM | POA: Diagnosis not present

## 2021-08-06 DIAGNOSIS — D3502 Benign neoplasm of left adrenal gland: Secondary | ICD-10-CM | POA: Diagnosis not present

## 2021-08-06 DIAGNOSIS — K5900 Constipation, unspecified: Secondary | ICD-10-CM | POA: Insufficient documentation

## 2021-08-06 DIAGNOSIS — J449 Chronic obstructive pulmonary disease, unspecified: Secondary | ICD-10-CM | POA: Diagnosis not present

## 2021-08-06 DIAGNOSIS — I10 Essential (primary) hypertension: Secondary | ICD-10-CM | POA: Diagnosis not present

## 2021-08-06 LAB — CBC
HCT: 41.5 % (ref 36.0–46.0)
Hemoglobin: 13.5 g/dL (ref 12.0–15.0)
MCH: 32.8 pg (ref 26.0–34.0)
MCHC: 32.5 g/dL (ref 30.0–36.0)
MCV: 101 fL — ABNORMAL HIGH (ref 80.0–100.0)
Platelets: 279 10*3/uL (ref 150–400)
RBC: 4.11 MIL/uL (ref 3.87–5.11)
RDW: 15.2 % (ref 11.5–15.5)
WBC: 10.6 10*3/uL — ABNORMAL HIGH (ref 4.0–10.5)
nRBC: 0 % (ref 0.0–0.2)

## 2021-08-06 LAB — COMPREHENSIVE METABOLIC PANEL
ALT: 15 U/L (ref 0–44)
AST: 16 U/L (ref 15–41)
Albumin: 3.6 g/dL (ref 3.5–5.0)
Alkaline Phosphatase: 89 U/L (ref 38–126)
Anion gap: 8 (ref 5–15)
BUN: 9 mg/dL (ref 6–20)
CO2: 24 mmol/L (ref 22–32)
Calcium: 8.4 mg/dL — ABNORMAL LOW (ref 8.9–10.3)
Chloride: 105 mmol/L (ref 98–111)
Creatinine, Ser: 0.85 mg/dL (ref 0.44–1.00)
GFR, Estimated: >60 mL/min (ref >60)
Glucose, Bld: 105 mg/dL — ABNORMAL HIGH (ref 70–99)
Potassium: 4.8 mmol/L (ref 3.5–5.1)
Sodium: 137 mmol/L (ref 135–145)
Total Bilirubin: 0.3 mg/dL (ref 0.3–1.2)
Total Protein: 6.1 g/dL — ABNORMAL LOW (ref 6.5–8.1)

## 2021-08-06 LAB — LIPASE, BLOOD: Lipase: 29 U/L (ref 11–51)

## 2021-08-06 MED ORDER — IOHEXOL 300 MG/ML  SOLN
100.0000 mL | Freq: Once | INTRAMUSCULAR | Status: AC | PRN
  Administered 2021-08-06: 100 mL via INTRAVENOUS

## 2021-08-06 MED ORDER — FENTANYL CITRATE PF 50 MCG/ML IJ SOSY
50.0000 ug | PREFILLED_SYRINGE | Freq: Once | INTRAMUSCULAR | Status: AC
  Administered 2021-08-06: 50 ug via INTRAVENOUS
  Filled 2021-08-06: qty 1

## 2021-08-06 NOTE — ED Provider Notes (Signed)
[facility] EMERGENCY DEPARTMENT Provider Note   CSN: [id number] Arrival date & time: 08/06/21  1828     History  Chief Complaint  Patient presents with   Abdominal Pain    Sherry [name] is a 51 y.o. female.   Abdominal Pain Associated symptoms: constipation and nausea   Associated symptoms: no chest pain and no shortness of breath   Patient denies abdominal pain.  Has had for around a week.  Epigastric to left upper quadrant.  No fevers or chills.  Has had nausea now without vomiting.  Has had constipation which is not unusual for her.  States she has been dealing with it for years.  Pain is dull.  States she went to see her PCP who told her her spleen was enlarged.  States that patient was worried about her gallbladder.  States she has had pain in her mid back also.  No known sick contacts.  No dysuria.   Past Medical History:  Diagnosis Date   COPD (chronic obstructive pulmonary disease) ([place])    Depression    High cholesterol    Hypertension    Lung collapse    left    Home Medications Prior to Admission medications   Medication Sig Start Date End Date Taking? Authorizing Provider  acetaminophen (TYLENOL) 650 MG CR tablet Take 325 mg by mouth every 8 (eight) hours as needed for pain.   Yes [provider]  albuterol (VENTOLIN HFA) 108 (90 Base) MCG/ACT inhaler Inhale into the lungs. 08/04/21  Yes [provider]  aspirin EC 81 MG tablet Take 81 mg by mouth daily. Swallow whole.   Yes [provider]  clonazePAM (KLONOPIN) 0.5 MG tablet Take 0.5 mg by mouth 3 (three) times daily as needed for anxiety.   Yes [provider]  docusate sodium (COLACE) 100 MG capsule Take 1 capsule (100 mg total) by mouth 2 (two) times daily as needed for moderate constipation. 08/07/21  Yes Davonna Belling, MD  ibuprofen (ADVIL) 100 MG tablet Take 200 mg by mouth every 6 (six) hours as needed for fever.   Yes [provider]  metoprolol tartrate  (LOPRESSOR) 50 MG tablet Take 0.5 tablets (25 mg total) by mouth 2 (two) times daily. 04/29/21  Yes Josue Hector, MD  omeprazole (PRILOSEC) 20 MG capsule Take 1 capsule (20 mg total) by mouth daily. 08/07/21  Yes Davonna Belling, MD  ondansetron (ZOFRAN-ODT) 4 MG disintegrating tablet Take 1 tablet (4 mg total) by mouth every 8 (eight) hours as needed for nausea or vomiting. 08/07/21  Yes Davonna Belling, MD  OVER THE [place] for menopause   Yes [provider]  potassium chloride SA (KLOR-CON) 20 MEQ tablet Take 1 tablet (20 mEq total) by mouth 2 (two) times daily for 3 days. 09/09/20 08/06/21 Yes Maudie Flakes, MD  QUEtiapine (SEROQUEL) 400 MG tablet Take 1 tablet (400 mg total) by mouth at bedtime. Patient taking differently: Take 800 mg by mouth at bedtime. 06/06/18  Yes Idol, Almyra Free, PA-C  rosuvastatin (CRESTOR) 40 MG tablet Take 40 mg by mouth every morning.  02/10/16  Yes [provider]  vortioxetine HBr (TRINTELLIX) 20 MG TABS tablet Take 20 mg by mouth daily.   Yes [provider]  amoxicillin-clavulanate (AUGMENTIN) 875-125 MG tablet Take 1 tablet by mouth every 12 (twelve) hours. Patient not taking: Reported on 08/06/2021 05/25/21   Caccavale, Sophia, PA-C  azithromycin (ZITHROMAX) 250 MG tablet Take 1 tablet (250 mg total) by  mouth daily. Take first 2 tablets together, then 1 every day until finished. Patient not taking: Reported on 08/06/2021 05/25/21   Caccavale, Sophia, PA-C  etodolac (LODINE) 300 MG capsule Take 1 capsule (300 mg total) by mouth every 8 (eight) hours. Patient not taking: Reported on 08/06/2021 12/03/20   Dorie Rank, MD  lubiprostone (AMITIZA) 24 MCG capsule Take 1 capsule (24 mcg total) by mouth 2 (two) times daily with a meal. Patient not taking: Reported on 08/06/2021 09/11/19   Carlis Stable, NP  hydrochlorothiazide (HYDRODIURIL) 25 MG tablet Take 25 mg by mouth every morning.  02/11/16 12/06/20  [provider]   linaclotide Rolan Lipa) 72 MCG capsule Take 1 capsule (72 mcg total) by mouth daily before breakfast. Patient not taking: No sig reported 07/27/19 12/06/20  Carlis Stable, NP  metoprolol succinate (TOPROL-XL) 50 MG 24 hr tablet Take 50 mg by mouth every morning. Take with or immediately following a meal.  12/06/20  [provider]  sertraline (ZOLOFT) 100 MG tablet Take 0.5 tablets (50 mg total) by mouth daily. Patient not taking: Reported on 12/03/2020 06/06/18 12/06/20  Evalee Jefferson, PA-C      Allergies    Dimenhydrinate and Prednisone    Review of Systems   Review of Systems  Constitutional:  Positive for appetite change.  Respiratory:  Negative for shortness of breath.   Cardiovascular:  Negative for chest pain.  Gastrointestinal:  Positive for abdominal pain, constipation and nausea.  Genitourinary:  Negative for enuresis and flank pain.  Musculoskeletal:  Positive for back pain.  Skin:  Negative for rash.  Neurological:  Negative for weakness.  Psychiatric/Behavioral:  Negative for confusion.    Physical Exam Updated Vital Signs BP 111/73 (BP Location: Left Arm)    Pulse 81    Temp 97.8 F (36.6 C) (Oral)    Resp 17    Wt 75.9 kg    SpO2 97%    BMI 27.84 kg/m  Physical Exam Vitals and nursing note reviewed.  HENT:     Head: Normocephalic.  Cardiovascular:     Rate and Rhythm: Regular rhythm.  Pulmonary:     Breath sounds: No wheezing or rhonchi.  Chest:     Chest wall: No tenderness.  Abdominal:     Tenderness: There is abdominal tenderness.     Comments: Epigastric tenderness.  Potentially some fullness.  No rebound or guarding.  No hernia palpated.  Skin:    General: Skin is warm.     Capillary Refill: Capillary refill takes less than 2 seconds.  Neurological:     Mental Status: She is alert and oriented to person, place, and time.    ED Results / Procedures / Treatments   Labs (all labs ordered are listed, but only abnormal results are displayed) Labs  Reviewed  COMPREHENSIVE METABOLIC PANEL - Abnormal; Notable for the following components:      Result Value   Glucose, Bld 105 (*)    Calcium 8.4 (*)    Total Protein 6.1 (*)    All other components within normal limits  CBC - Abnormal; Notable for the following components:   WBC 10.6 (*)    MCV 101.0 (*)    All other components within normal limits  LIPASE, BLOOD  URINALYSIS, ROUTINE W REFLEX MICROSCOPIC    EKG None  Radiology CT ABDOMEN PELVIS W CONTRAST  Result Date: 08/06/2021 CLINICAL DATA:  Epigastric pain. EXAM: CT ABDOMEN AND PELVIS WITH CONTRAST TECHNIQUE: Multidetector CT imaging of the  abdomen and pelvis was performed using the standard protocol following bolus administration of intravenous contrast. RADIATION DOSE REDUCTION: This exam was performed according to the departmental dose-optimization program which includes automated exposure control, adjustment of the mA and/or kV according to patient size and/or use of iterative reconstruction technique. CONTRAST:  171mL OMNIPAQUE IOHEXOL 300 MG/ML  SOLN COMPARISON:  CT abdomen and pelvis 03/04/2019. FINDINGS: Lower chest: There is some fibrotic changes in the lung bases which have increased. Hepatobiliary: There is intra and extrahepatic biliary ductal dilatation. Common bile duct measures 1 cm. Findings are similar to the prior study. Gallbladder is unremarkable. The liver is otherwise within normal limits. Pancreas: Unremarkable. No pancreatic ductal dilatation or surrounding inflammatory changes. Spleen: Normal in size without focal abnormality. Adrenals/Urinary Tract: There is a 9 mm left adrenal adenoma which is unchanged. Right adrenal gland is within normal limits. Bladder and kidneys are within normal limits. Stomach/Bowel: Stomach is within normal limits. Appendix appears normal. No evidence of bowel wall thickening, distention, or inflammatory changes. There is a large amount of stool throughout the entire colon.  Vascular/Lymphatic: Aortic atherosclerosis. No enlarged abdominal or pelvic lymph nodes. Reproductive: Uterus and bilateral adnexa are unremarkable. Other: No abdominal wall hernia or abnormality. No abdominopelvic ascites. Musculoskeletal: No acute or significant osseous findings. IMPRESSION: 1. Stable intra and extrahepatic biliary ductal dilatation with normal appearance of the gallbladder. This is of uncertain etiology. Recommend correlation with lab values. 2. Large stool burden. 3. Stable left adrenal adenoma. 4. Increasing fibrotic changes in the lung bases. 5. Aortic Atherosclerosis (ICD10-I70.0). Electronically Signed   By: Ronney Asters M.D.   On: 08/06/2021 22:59    Procedures Procedures    Medications Ordered in ED Medications  iohexol (OMNIPAQUE) 300 MG/ML solution 100 mL (100 mLs Intravenous Contrast Given 08/06/21 2238)  fentaNYL (SUBLIMAZE) injection 50 mcg (50 mcg Intravenous Given 08/06/21 2221)    ED Course/ Medical Decision Making/ A&P                           Medical Decision Making Amount and/or Complexity of Data Reviewed Labs: ordered. Radiology: ordered.  Risk OTC drugs. Prescription drug management.  Differential diagnosis for abdominal pain is broad and includes life-threatening conditions such as cholecystitis, bowel obstruction, diverticulitis. Patient presents with abdominal pain.  Upper abdomen.  Dull.  States does go to the back.  Has had nausea and vomiting at times.  No diarrhea.  Has had for around a week.  States she does deal with constipation.  Lab work reviewed.  White count mildly elevated.  LFTs reassuring. CT scan done due to pain and showed constipation but also showed mildly dilated biliary tract.  This however is stable.  Normal LFTs.  States she has been dealing with constipation for years.  States MiraLAX has not really worked for her.  We will add some Colace, will also add some Protonix since she is having epigastric pain.  Will need likely GI  follow-up.  Also antibiotics given.  Discharge home.        Final Clinical Impression(s) / ED Diagnoses Final diagnoses:  Pain of upper abdomen    Rx / DC Orders ED Discharge Orders          Ordered    omeprazole (PRILOSEC) 20 MG capsule  Daily        08/07/21 0012    ondansetron (ZOFRAN-ODT) 4 MG disintegrating tablet  Every 8 hours PRN  08/07/21 0012    docusate sodium (COLACE) 100 MG capsule  2 times daily PRN        08/07/21 0012              Davonna Belling, MD 08/07/21 [phone]

## 2021-08-06 NOTE — ED Notes (Signed)
Gave pt 8 oz of sprite to drink for fluid/po challenge

## 2021-08-06 NOTE — ED Triage Notes (Signed)
Abdominal pain onset 1 week ago

## 2021-08-06 NOTE — ED Notes (Signed)
Patient transported to CT 

## 2021-08-07 LAB — URINALYSIS, ROUTINE W REFLEX MICROSCOPIC
Bilirubin Urine: NEGATIVE
Glucose, UA: NEGATIVE mg/dL
Hgb urine dipstick: NEGATIVE
Ketones, ur: NEGATIVE mg/dL
Leukocytes,Ua: NEGATIVE
Nitrite: NEGATIVE
Protein, ur: NEGATIVE mg/dL
Specific Gravity, Urine: 1.01 (ref 1.005–1.030)
pH: 6.5 (ref 5.0–8.0)

## 2021-08-07 MED ORDER — DOCUSATE SODIUM 100 MG PO CAPS: 100.0000 mg | ORAL_CAPSULE | Freq: Two times a day (BID) | ORAL | 0 refills | Status: DC | PRN

## 2021-08-07 MED ORDER — OMEPRAZOLE 20 MG PO CPDR: 20.0000 mg | DELAYED_RELEASE_CAPSULE | Freq: Every day | ORAL | 0 refills | Status: DC

## 2021-08-07 MED ORDER — ONDANSETRON 4 MG PO TBDP: 4.0000 mg | ORAL_TABLET | Freq: Three times a day (TID) | ORAL | 0 refills | Status: DC | PRN

## 2021-10-29 ENCOUNTER — Telehealth: Payer: Self-pay | Admitting: Cardiovascular Disease

## 2021-10-29 NOTE — Telephone Encounter (Signed)
I spoke with patient and she reports several months of intermittent chest discomfort which is aggravated with deep inspiration.She reports intermittent non-productive cough also which has been worse over the last week.Pain is also noticed between shoulder blades.Her cardiac CT last 01/2021 noted:Lungs/Pleura: Centrilobular and paraseptal emphysema. Dependent ?atelectasis bilaterally. ? ? ? 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 ? ?Also noted degenerative changes in spine. ? ? ?Dr.Nishan at that time recommended patient follow up with pulmonary given she had an abnormal chest Ct in April 2022. ? ? ?Patient has not followed up with pulmonary.I asked her to make follow up. She has f/u with per PCP next month and Dr.Nishan in June. ? ? ? ?I will FYI Dr.Nishan. ?

## 2021-10-29 NOTE — Telephone Encounter (Signed)
Pt c/o of Chest Pain: STAT if CP now or developed within 24 hours ? ?1. Are you having CP right now? No ? ?2. Are you experiencing any other symptoms (ex. SOB, nausea, vomiting, sweating)? Shoulder blade pain ? ?3. How long have you been experiencing CP? 2 months ago, gotten worse over the last 2 weeks ? ? ?4. Is your CP continuous or coming and going? Come and go ? ?5. Have you taken Nitroglycerin? No ?? ? ?

## 2021-11-30 ENCOUNTER — Other Ambulatory Visit (HOSPITAL_COMMUNITY): Payer: Self-pay | Admitting: Internal Medicine

## 2021-11-30 DIAGNOSIS — Z1231 Encounter for screening mammogram for malignant neoplasm of breast: Secondary | ICD-10-CM

## 2021-12-04 ENCOUNTER — Ambulatory Visit (HOSPITAL_COMMUNITY): Payer: BC Managed Care – PPO

## 2021-12-10 ENCOUNTER — Ambulatory Visit (HOSPITAL_COMMUNITY)
Admission: RE | Admit: 2021-12-10 | Discharge: 2021-12-10 | Disposition: A | Discharge status: Home / Self Care | Payer: BC Managed Care – PPO | Source: Ambulatory Visit | Attending: Internal Medicine | Admitting: Internal Medicine

## 2021-12-10 DIAGNOSIS — Z1231 Encounter for screening mammogram for malignant neoplasm of breast: Secondary | ICD-10-CM | POA: Diagnosis present

## 2021-12-15 ENCOUNTER — Encounter: Payer: Self-pay | Admitting: *Deleted

## 2021-12-15 ENCOUNTER — Encounter (INDEPENDENT_AMBULATORY_CARE_PROVIDER_SITE_OTHER): Payer: Self-pay | Admitting: *Deleted

## 2021-12-25 ENCOUNTER — Ambulatory Visit: Payer: BC Managed Care – PPO | Admitting: Cardiovascular Disease

## 2022-02-09 ENCOUNTER — Other Ambulatory Visit (INDEPENDENT_AMBULATORY_CARE_PROVIDER_SITE_OTHER): Payer: Self-pay

## 2022-02-09 ENCOUNTER — Encounter (INDEPENDENT_AMBULATORY_CARE_PROVIDER_SITE_OTHER): Payer: Self-pay

## 2022-02-09 ENCOUNTER — Telehealth (INDEPENDENT_AMBULATORY_CARE_PROVIDER_SITE_OTHER): Payer: Self-pay

## 2022-02-09 ENCOUNTER — Encounter (INDEPENDENT_AMBULATORY_CARE_PROVIDER_SITE_OTHER): Payer: Self-pay | Admitting: *Deleted

## 2022-02-09 DIAGNOSIS — Z1211 Encounter for screening for malignant neoplasm of colon: Secondary | ICD-10-CM

## 2022-02-09 MED ORDER — NA SULFATE-K SULFATE-MG SULF 17.5-3.13-1.6 GM/177ML PO SOLN
1.0000 | Freq: Once | ORAL | 0 refills | Status: AC
Start: 2022-02-09 — End: 2022-02-09

## 2022-02-09 NOTE — Telephone Encounter (Signed)
Referring MD/PCP: Dr Yong Channel  Procedure: Tcs  Reason/Indication:  Screening   Has patient had this procedure before?  no  If so, when, by whom and where?    Is there a family history of colon cancer?  Yes   Who?  What age when diagnosed?  All Grandparents  Is patient diabetic? If yes, Type 1 or Type 2   no      Does patient have prosthetic heart valve or mechanical valve?  no  Do you have a pacemaker/defibrillator?  no  Has patient ever had endocarditis/atrial fibrillation? no  Does patient use oxygen? no  Has patient had joint replacement within last 12 months?  no  Is patient constipated or do they take laxatives? yes  Does patient have a history of alcohol/drug use?  no  Have you had a stroke/heart attack last 6 mths? no  Do you take medicine for weight loss?  no  For female patients,: have you had a hysterectomy no                       are you post menopausal yes                      do you still have your menstrual cycle no  Is patient on blood thinner such as Coumadin, Plavix and/or Aspirin? yes  Medications: asa 81 mg daily, Pota choride 20 meq daily, clonazepam 0.5 mg tid, pantoprazole 40 mg daily, Vit D3 1.25 twice a week, fluoxide 10 mg daily, rosubastatin 40 mg daily, metoprolol 25 mg daily  Allergies: NKDA  Medication Adjustment per Dr Jenetta Downer none   Procedure date & time: 03/11/22 at 945

## 2022-02-09 NOTE — Telephone Encounter (Signed)
Sherry [name] Ann Sharell Hilmer, CMA  ?

## 2022-03-03 ENCOUNTER — Ambulatory Visit: Payer: BC Managed Care – PPO | Admitting: Cardiovascular Disease

## 2022-03-17 ENCOUNTER — Ambulatory Visit (HOSPITAL_COMMUNITY): Payer: BC Managed Care – PPO | Admitting: Anesthesiology

## 2022-03-17 ENCOUNTER — Ambulatory Visit (HOSPITAL_COMMUNITY)
Admission: RE | Admit: 2022-03-17 | Discharge: 2022-03-17 | Disposition: A | Discharge status: Home / Self Care | Payer: BC Managed Care – PPO | Attending: Gastroenterology | Admitting: Gastroenterology

## 2022-03-17 ENCOUNTER — Other Ambulatory Visit: Payer: Self-pay

## 2022-03-17 ENCOUNTER — Encounter (HOSPITAL_COMMUNITY): Payer: Self-pay | Admitting: Gastroenterology

## 2022-03-17 ENCOUNTER — Encounter (HOSPITAL_COMMUNITY)
Admission: RE | Disposition: A | Discharge status: Home / Self Care | Payer: Self-pay | Source: Home / Self Care | Attending: Gastroenterology

## 2022-03-17 ENCOUNTER — Encounter (INDEPENDENT_AMBULATORY_CARE_PROVIDER_SITE_OTHER): Payer: Self-pay | Admitting: *Deleted

## 2022-03-17 DIAGNOSIS — F17210 Nicotine dependence, cigarettes, uncomplicated: Secondary | ICD-10-CM | POA: Diagnosis not present

## 2022-03-17 DIAGNOSIS — Z800 Family history of malignant neoplasm of digestive organs: Secondary | ICD-10-CM | POA: Diagnosis not present

## 2022-03-17 DIAGNOSIS — Z1211 Encounter for screening for malignant neoplasm of colon: Secondary | ICD-10-CM | POA: Diagnosis present

## 2022-03-17 DIAGNOSIS — K635 Polyp of colon: Secondary | ICD-10-CM | POA: Diagnosis not present

## 2022-03-17 DIAGNOSIS — Q438 Other specified congenital malformations of intestine: Secondary | ICD-10-CM | POA: Diagnosis not present

## 2022-03-17 DIAGNOSIS — F32A Depression, unspecified: Secondary | ICD-10-CM | POA: Insufficient documentation

## 2022-03-17 DIAGNOSIS — J449 Chronic obstructive pulmonary disease, unspecified: Secondary | ICD-10-CM | POA: Diagnosis not present

## 2022-03-17 DIAGNOSIS — D123 Benign neoplasm of transverse colon: Secondary | ICD-10-CM | POA: Insufficient documentation

## 2022-03-17 DIAGNOSIS — I10 Essential (primary) hypertension: Secondary | ICD-10-CM | POA: Insufficient documentation

## 2022-03-17 DIAGNOSIS — D122 Benign neoplasm of ascending colon: Secondary | ICD-10-CM | POA: Diagnosis not present

## 2022-03-17 DIAGNOSIS — K219 Gastro-esophageal reflux disease without esophagitis: Secondary | ICD-10-CM | POA: Insufficient documentation

## 2022-03-17 HISTORY — PX: POLYPECTOMY: SHX5525

## 2022-03-17 HISTORY — PX: COLONOSCOPY WITH PROPOFOL: SHX5780

## 2022-03-17 LAB — HM COLONOSCOPY

## 2022-03-17 SURGERY — COLONOSCOPY WITH PROPOFOL
Anesthesia: General

## 2022-03-17 MED ORDER — PROPOFOL 10 MG/ML IV BOLUS
INTRAVENOUS | Status: DC | PRN
  Administered 2022-03-17: 50 mg via INTRAVENOUS

## 2022-03-17 MED ORDER — EPHEDRINE SULFATE-NACL 50-0.9 MG/10ML-% IV SOSY
PREFILLED_SYRINGE | INTRAVENOUS | Status: DC | PRN
  Administered 2022-03-17 (×2): 10 mg via INTRAVENOUS

## 2022-03-17 MED ORDER — PROPOFOL 500 MG/50ML IV EMUL
INTRAVENOUS | Status: DC | PRN
  Administered 2022-03-17: 175 ug/kg/min via INTRAVENOUS

## 2022-03-17 MED ORDER — PHENYLEPHRINE 80 MCG/ML (10ML) SYRINGE FOR IV PUSH (FOR BLOOD PRESSURE SUPPORT)
PREFILLED_SYRINGE | INTRAVENOUS | Status: DC | PRN
  Administered 2022-03-17 (×2): 40 ug via INTRAVENOUS
  Administered 2022-03-17: 80 ug via INTRAVENOUS

## 2022-03-17 MED ORDER — LACTATED RINGERS IV SOLN: INTRAVENOUS | Status: DC | PRN

## 2022-03-17 MED ORDER — LACTATED RINGERS IV SOLN: INTRAVENOUS | Status: DC

## 2022-03-17 NOTE — Op Note (Signed)
[facility] Patient Name: Sherry [name] Procedure Date: 03/17/2022 9:32 AM MRN: [id number] Date of Birth: 19-Oct-1970 Attending MD: Maylon Peppers ,  CSN: [id number] Age: 51 Admit Type: Outpatient Procedure:                Colonoscopy Indications:              Colon cancer screening in patient at increased                            risk: Family history of colorectal cancer in                            multiple 2nd degree relatives Providers:                Maylon Peppers, Charlsie Quest. Insurance claims handler, Therapist, sports,                            Suzan Garibaldi. Risa Grill, Technician Referring MD:              Medicines:                Monitored Anesthesia Care Complications:            No immediate complications. Estimated Blood Loss:     Estimated blood loss: none. Procedure:                Pre-Anesthesia Assessment:                           - Prior to the procedure, a History and Physical                            was performed, and patient medications, allergies                            and sensitivities were reviewed. The patient's                            tolerance of previous anesthesia was reviewed.                           - The risks and benefits of the procedure and the                            sedation options and risks were discussed with the                            patient. All questions were answered and informed                            consent was obtained.                           - ASA Grade Assessment: II - A patient with mild                            systemic  disease.                           After obtaining informed consent, the colonoscope                            was passed under direct vision. Throughout the                            procedure, the patient's blood pressure, pulse, and                            oxygen saturations were monitored continuously. The                            [id number]-HQ190L ([id number]) scope was introduced through                             the anus and advanced to the the cecum, identified                            by appendiceal orifice and ileocecal valve. The                            patient tolerated the procedure well. The                            colonoscopy was somewhat difficult due to                            significant looping. The quality of the bowel                            preparation was excellent. Scope In: 9:49:49 AM Scope Out: 10:42:42 AM Scope Withdrawal Time: 0 hours 23 minutes 38 seconds  Total Procedure Duration: 0 hours 52 minutes 53 seconds  Findings:      The perianal and digital rectal examinations were normal.      A 2 mm polyp was found in the ascending colon. The polyp was sessile.       The polyp was removed with a cold biopsy forceps. Resection and       retrieval were complete.      A 5 mm polyp was found in the transverse colon. The polyp was sessile.       The polyp was removed with a cold snare. Resection and retrieval were       complete.      Two sessile polyps were found in the sigmoid colon and descending colon.       The polyps were 1 to 2 mm in size. These polyps were removed with a cold       biopsy forceps. Resection and retrieval were complete.      The colon (entire examined portion) was significantly tortuous.       Intermittent abdominal pressure was applied in the epigastric area and       LLQ area to effectively advance the scope.      The retroflexed  view of the distal rectum and anal verge was normal and       showed no anal or rectal abnormalities. Impression:               - One 2 mm polyp in the ascending colon, removed                            with a cold biopsy forceps. Resected and retrieved.                           - One 5 mm polyp in the transverse colon, removed                            with a cold snare. Resected and retrieved.                           - Two 1 to 2 mm polyps in the sigmoid colon and in                            the  descending colon, removed with a cold biopsy                            forceps. Resected and retrieved.                           - Tortuous colon. Moderate Sedation:      Per Anesthesia Care Recommendation:           - Discharge patient to home (ambulatory).                           - Resume previous diet.                           - Await pathology results.                           - Repeat colonoscopy in 5 years for surveillance. Procedure Code(s):        --- Professional ---                           [phone], Colonoscopy, flexible; with removal of                            tumor(s), polyp(s), or other lesion(s) by snare                            technique                           81017, 55, Colonoscopy, flexible; with biopsy,                            single or multiple Diagnosis Code(s):        --- Professional ---  K63.5, Polyp of colon                           Z80.0, Family history of malignant neoplasm of                            digestive organs                           Q43.8, Other specified congenital malformations of                            intestine CPT copyright 2019 [employer] Medical Association. All rights reserved. The codes documented in this report are preliminary and upon coder review may  be revised to meet current compliance requirements. Maylon Peppers, MD Maylon Peppers,  03/17/2022 10:54:29 AM This report has been signed electronically. Number of Addenda: 0

## 2022-03-17 NOTE — Anesthesia Preprocedure Evaluation (Signed)
Anesthesia Evaluation  Patient identified by MRN, date of birth, ID band Patient awake    Reviewed: Allergy & Precautions, NPO status , Patient's Chart, lab work & pertinent test results, reviewed documented beta blocker date and time   Airway Mallampati: III  TM Distance: >3 FB Neck ROM: Full    Dental  (+) Dental Advisory Given, Upper Dentures, Lower Dentures   Pulmonary shortness of breath and with exertion, COPD,  COPD inhaler, Current SmokerPatient did not abstain from smoking.,    Pulmonary exam normal breath sounds clear to auscultation       Cardiovascular Exercise Tolerance: Good hypertension, Pt. on medications and Pt. on home beta blockers Normal cardiovascular exam Rhythm:Regular Rate:Normal     Neuro/Psych PSYCHIATRIC DISORDERS Depression negative neurological ROS     GI/Hepatic Neg liver ROS, GERD  Medicated and Controlled,  Endo/Other  negative endocrine ROS  Renal/GU negative Renal ROS  negative genitourinary   Musculoskeletal negative musculoskeletal ROS (+)   Abdominal   Peds negative pediatric ROS (+)  Hematology negative hematology ROS (+)   Anesthesia Other Findings   Reproductive/Obstetrics negative OB ROS                             Anesthesia Physical Anesthesia Plan  ASA: 2  Anesthesia Plan: General   Post-op Pain Management: Minimal or no pain anticipated   Induction: Intravenous  PONV Risk Score and Plan: Propofol infusion  Airway Management Planned: Nasal Cannula and Natural Airway  Additional Equipment:   Intra-op Plan:   Post-operative Plan:   Informed Consent: I have reviewed the patients History and Physical, chart, labs and discussed the procedure including the risks, benefits and alternatives for the proposed anesthesia with the patient or authorized representative who has indicated his/her understanding and acceptance.     Dental  advisory given  Plan Discussed with: CRNA and Surgeon  Anesthesia Plan Comments:         Anesthesia Quick Evaluation

## 2022-03-17 NOTE — Anesthesia Postprocedure Evaluation (Signed)
Anesthesia Post Note  Patient: Sherry [name]  Procedure(s) Performed: COLONOSCOPY WITH PROPOFOL POLYPECTOMY  Patient location during evaluation: Phase II Anesthesia Type: General Level of consciousness: awake and alert and oriented Pain management: pain level controlled Vital Signs Assessment: post-procedure vital signs reviewed and stable Respiratory status: spontaneous breathing, nonlabored ventilation and respiratory function stable Cardiovascular status: blood pressure returned to baseline and stable Postop Assessment: no apparent nausea or vomiting Anesthetic complications: no   No notable events documented.   Last Vitals:  Vitals:   03/17/22 1049 03/17/22 1050  BP: (!) 89/73 95/63  Pulse:    Resp:    Temp:    SpO2:      Last Pain:  Vitals:   03/17/22 1050  TempSrc:   PainSc: 0-No pain                 Liddy Deam C Marrissa Dai

## 2022-03-17 NOTE — H&P (Signed)
Sherry [name] is an 51 y.o. female.   Chief Complaint: Screening colonoscopy and family history of colon cancer HPI: 51 year old female with past medical history of COPD, depression, hyperlipidemia, hypertension, coming for screening colonoscopy and family history of colon cancer. The patient has never had a colonoscopy in the past.  The patient denies having any complaints such as melena, hematochezia, abdominal pain or distention, change in her bowel movement consistency or frequency, no changes in weight recently.  Patient states that all her grandparents and 1 uncle had colon cancer, unknown age.   Past Medical History:  Diagnosis Date   COPD (chronic obstructive pulmonary disease) (HCC)    Depression    High cholesterol    Hypertension    Lung collapse    left    Past Surgical History:  Procedure Laterality Date   CHEST TUBE INSERTION     ENDOMETRIAL ABLATION      Family History  Problem Relation Age of Onset   Colon polyps Mother    Colon cancer Maternal Grandmother    Colon cancer Maternal Grandfather    Colon cancer Paternal Grandmother    Colon cancer Paternal Grandfather    Colon cancer Paternal Uncle    Social History:  reports that she has been smoking cigarettes. She has a 26.25 pack-year smoking history. She has never used smokeless tobacco. She reports current alcohol use. She reports that she does not use drugs.  Allergies:  Allergies  Allergen Reactions   Dramamine [Dimenhydrinate] Swelling and Other (See Comments)    Made her feel really drunk. Swelling of tongue     Prednisone Other (See Comments)    Feels like fingers are swelling    Medications Prior to Admission  Medication Sig Dispense Refill   acetaminophen (TYLENOL) 500 MG tablet Take 1,000 mg by mouth every 6 (six) hours as needed (pain.).     albuterol (VENTOLIN HFA) 108 (90 Base) MCG/ACT inhaler Inhale 1-2 puffs into the lungs every 6 (six) hours as needed for shortness of breath or wheezing.      aspirin EC 81 MG tablet Take 81 mg by mouth daily. Swallow whole.     clonazePAM (KLONOPIN) 0.5 MG tablet Take 0.5 mg by mouth in the morning, at noon, and at bedtime.     FLUoxetine (PROZAC) 10 MG capsule Take 10 mg by mouth in the morning.     ibuprofen (ADVIL) 200 MG tablet Take 400 mg by mouth every 8 (eight) hours as needed (pain.).     metoprolol tartrate (LOPRESSOR) 50 MG tablet Take 0.5 tablets (25 mg total) by mouth 2 (two) times daily. 90 tablet 3   pantoprazole (PROTONIX) 40 MG tablet Take 40 mg by mouth daily before breakfast.     potassium chloride SA (KLOR-CON) 20 MEQ tablet Take 1 tablet (20 mEq total) by mouth 2 (two) times daily for 3 days. (Patient taking differently: Take 20 mEq by mouth in the morning.) 6 tablet 0   QUEtiapine (SEROQUEL) 400 MG tablet Take 1 tablet (400 mg total) by mouth at bedtime. (Patient taking differently: Take 800 mg by mouth at bedtime.) 10 tablet 0   sodium chloride (OCEAN) 0.65 % SOLN nasal spray Place 1 spray into both nostrils as needed for congestion.     Vitamin D, Ergocalciferol, (DRISDOL) 1.25 MG (50000 UNIT) CAPS capsule Take 50,000 Units by mouth 2 (two) times a week. Mondays & Thursdays      No results found for this or any previous visit (from the  past 48 hour(s)). No results found.  Review of Systems  All other systems reviewed and are negative.   Blood pressure 107/62, pulse 73, temperature 97.9 F (36.6 C), temperature source Oral, resp. rate 17, height 5' 5.5" (1.664 m), weight 64.4 kg, SpO2 95 %. Physical Exam  GENERAL: The patient is AO x3, in no acute distress. HEENT: Head is normocephalic and atraumatic. EOMI are intact. Mouth is well hydrated and without lesions. NECK: Supple. No masses LUNGS: Clear to auscultation. No presence of rhonchi/wheezing/rales. Adequate chest expansion HEART: RRR, normal s1 and s2. ABDOMEN: Soft, nontender, no guarding, no peritoneal signs, and nondistended. BS +. No masses. EXTREMITIES:  Without any cyanosis, clubbing, rash, lesions or edema. NEUROLOGIC: AOx3, no focal motor deficit. SKIN: no jaundice, no rashes  Assessment/Plan 51 year old female with past medical history of COPD, depression, hyperlipidemia, hypertension, coming for screening colonoscopy and family history of colon cancer.  We will proceed with colonoscopy.  Harvel Quale, MD 03/17/2022, 8:47 AM

## 2022-03-17 NOTE — Discharge Instructions (Addendum)
You are being discharged to home.  Resume your previous diet.  We are waiting for your pathology results.  Your physician has recommended a repeat colonoscopy in five years for surveillance.  

## 2022-03-17 NOTE — Transfer of Care (Signed)
Immediate Anesthesia Transfer of Care Note  Patient: Sherry [name]  Procedure(s) Performed: COLONOSCOPY WITH PROPOFOL POLYPECTOMY  Patient Location: PACU  Anesthesia Type:General  Level of Consciousness: awake  Airway & Oxygen Therapy: Patient Spontanous Breathing and Patient connected to nasal cannula oxygen  Post-op Assessment: Report given to RN and Post -op Vital signs reviewed and stable  Post vital signs: Reviewed and stable  Last Vitals:  Vitals Value Taken Time  BP    Temp    Pulse    Resp    SpO2      Last Pain:  Vitals:   03/17/22 0942  TempSrc:   PainSc: 0-No pain      Patients Stated Pain Goal: 7 (35/52/17 4715)  Complications: No notable events documented.

## 2022-03-19 LAB — SURGICAL PATHOLOGY

## 2022-03-24 ENCOUNTER — Encounter (HOSPITAL_COMMUNITY): Payer: Self-pay | Admitting: Gastroenterology

## 2022-05-12 NOTE — Progress Notes (Incomplete)
CARDIOLOGY CONSULT NOTE       Patient ID: Sherry [name] MRN: [id number] DOB/AGE: 1970-12-03 51 y.o.  Admit date: (Not on file) Referring Physician: [facility]  Primary Physician: Abran Richard, MD Primary Cardiologist: Johnsie Cancel Reason for Consultation: Chest pain   HPI:  51 y.o. referred by Noemi Chapel MD [facility] for chest pain. First seen by me 01/29/21 Seen there 12/06/20. She has anxiety , depression, bipolar disorder and COPD still smoking 1/2 ppd  She has been seen numerous times in ED for atypical chest pain And prescribed etodolac Sharp radiates to left shoulder blade Worse with pain to palpation She feels pain Is associated with tachycardia She always r/o, negative d dimer no abnormality on ECGls or CXR She smokes marijuana on occasion She was d/c with lopressor 50 mg bid She did have a high resolution CT chest 10/14/20 shtat showed reticular gound glass changes ? Bronchiolitis There was note on this scan of aortic atherosclerosis and coronary calcification   She also has positive family history of CAD. She indicates feeling some better on lopressor   Cardiac CTA 02/02/21 low risk Calcium score 29.7 93 rd percentile CAD RADS 1 < 1-24% plaque in RCA/LAD   Colonoscopy 03/17/22 polyps removed   ***   ROS All other systems reviewed and negative except as noted above  Past Medical History:  Diagnosis Date  . COPD (chronic obstructive pulmonary disease) ([place])   . Depression   . High cholesterol   . Hypertension   . Lung collapse    left    Family History  Problem Relation Age of Onset  . Colon polyps Mother   . Colon cancer Maternal Grandmother   . Colon cancer Maternal Grandfather   . Colon cancer Paternal Grandmother   . Colon cancer Paternal Grandfather   . Colon cancer Paternal Uncle     Social History   Socioeconomic History  . Marital status: Divorced    Spouse name: Not on file  . Number of children: Not on file  . Years of education: Not on file  .  Highest education level: Not on file  Occupational History  . Not on file  Tobacco Use  . Smoking status: Every Day    Packs/day: 0.75    Years: 35.00    Total pack years: 26.25    Types: Cigarettes  . Smokeless tobacco: Never  Vaping Use  . Vaping Use: Never used  Substance and Sexual Activity  . Alcohol use: Yes    Comment: occasionally   . Drug use: Never  . Sexual activity: Not on file  Other Topics Concern  . Not on file  Social History Narrative  . Not on file   Social Determinants of Health   Financial Resource Strain: Not on file  Food Insecurity: Not on file  Transportation Needs: Not on file  Physical Activity: Not on file  Stress: Not on file  Social Connections: Not on file  Intimate Partner Violence: Not on file    Past Surgical History:  Procedure Laterality Date  . CHEST TUBE INSERTION    . COLONOSCOPY WITH PROPOFOL N/A 03/17/2022   Procedure: COLONOSCOPY WITH PROPOFOL;  Surgeon: Harvel Quale, MD;  Location: [facility];  Service: Gastroenterology;  Laterality: N/A;  930 ASA 2  . ENDOMETRIAL ABLATION    . POLYPECTOMY  03/17/2022   Procedure: POLYPECTOMY;  Surgeon: Harvel Quale, MD;  Location: [facility];  Service: Gastroenterology;;  Current Outpatient Medications:  .  acetaminophen (TYLENOL) 500 MG tablet, Take 1,000 mg by mouth every 6 (six) hours as needed (pain.)., Disp: , Rfl:  .  albuterol (VENTOLIN HFA) 108 (90 Base) MCG/ACT inhaler, Inhale 1-2 puffs into the lungs every 6 (six) hours as needed for shortness of breath or wheezing., Disp: , Rfl:  .  aspirin EC 81 MG tablet, Take 81 mg by mouth daily. Swallow whole., Disp: , Rfl:  .  clonazePAM (KLONOPIN) 0.5 MG tablet, Take 0.5 mg by mouth in the morning, at noon, and at bedtime., Disp: , Rfl:  .  FLUoxetine (PROZAC) 10 MG capsule, Take 10 mg by mouth in the morning., Disp: , Rfl:  .  ibuprofen (ADVIL) 200 MG tablet, Take 400 mg by mouth every 8 (eight) hours as  needed (pain.)., Disp: , Rfl:  .  metoprolol tartrate (LOPRESSOR) 50 MG tablet, Take 0.5 tablets (25 mg total) by mouth 2 (two) times daily., Disp: 90 tablet, Rfl: 3 .  pantoprazole (PROTONIX) 40 MG tablet, Take 40 mg by mouth daily before breakfast., Disp: , Rfl:  .  potassium chloride SA (KLOR-CON) 20 MEQ tablet, Take 1 tablet (20 mEq total) by mouth 2 (two) times daily for 3 days. (Patient taking differently: Take 20 mEq by mouth in the morning.), Disp: 6 tablet, Rfl: 0 .  QUEtiapine (SEROQUEL) 400 MG tablet, Take 1 tablet (400 mg total) by mouth at bedtime. (Patient taking differently: Take 800 mg by mouth at bedtime.), Disp: 10 tablet, Rfl: 0 .  sodium chloride (OCEAN) 0.65 % SOLN nasal spray, Place 1 spray into both nostrils as needed for congestion., Disp: , Rfl:  .  Vitamin D, Ergocalciferol, (DRISDOL) 1.25 MG (50000 UNIT) CAPS capsule, Take 50,000 Units by mouth 2 (two) times a week. Mondays & Thursdays, Disp: , Rfl:     Physical Exam: There were no vitals taken for this visit.   Affect appropriate Healthy:  appears stated age 89: normal Neck supple with no adenopathy JVP normal no bruits no thyromegaly Lungs clear with no wheezing and good diaphragmatic motion Heart:  S1/S2 no murmur, no rub, gallop or click PMI normal Abdomen: benighn, BS positve, no tenderness, no AAA no bruit.  No HSM or HJR Distal pulses intact with no bruits No edema Neuro non-focal Skin warm and dry No muscular weakness   Labs:   Lab Results  Component Value Date   WBC 10.6 (H) 08/06/2021   HGB 13.5 08/06/2021   HCT 41.5 08/06/2021   MCV 101.0 (H) 08/06/2021   PLT 279 08/06/2021   No results for input(s): "NA", "K", "CL", "CO2", "BUN", "CREATININE", "CALCIUM", "PROT", "BILITOT", "ALKPHOS", "ALT", "AST", "GLUCOSE" in the last 168 hours.  Invalid input(s): "LABALBU" Lab Results  Component Value Date   TROPONINI <0.03 12/02/2018   No results found for: "CHOL" No results found for:  "HDL" No results found for: "[place]" No results found for: "TRIG" No results found for: "CHOLHDL" No results found for: "LDLDIRECT"    Radiology: No results found.  EKG: ST rate 145 normal otherwise 12/09/20 05/12/2022 NSR normal ECG    ASSESSMENT AND PLAN:   Chest Pain: atypical cardiac CTA 02/02/21 low risk non obstructive dx continue risk factor modification   HLD:  continue statin labs with primary COPD:  should have f/u with pulmonary given abnormal chest CT in April May be developing ILD Bipolar:  continue klonopin , Seroquel and Effexor these meds may contribute to her tachycardia  F/U PRN  SignedJenkins Rouge 05/12/2022, 2:43 PM

## 2022-05-21 ENCOUNTER — Ambulatory Visit: Payer: BC Managed Care – PPO | Admitting: Cardiovascular Disease

## 2022-06-24 ENCOUNTER — Other Ambulatory Visit (HOSPITAL_COMMUNITY): Payer: Self-pay | Admitting: Internal Medicine

## 2022-06-24 DIAGNOSIS — R918 Other nonspecific abnormal finding of lung field: Secondary | ICD-10-CM

## 2022-06-24 DIAGNOSIS — F17200 Nicotine dependence, unspecified, uncomplicated: Secondary | ICD-10-CM

## 2022-06-24 DIAGNOSIS — R634 Abnormal weight loss: Secondary | ICD-10-CM

## 2022-07-09 ENCOUNTER — Other Ambulatory Visit: Payer: Self-pay | Admitting: *Deleted

## 2022-07-09 DIAGNOSIS — R2243 Localized swelling, mass and lump, lower limb, bilateral: Secondary | ICD-10-CM

## 2022-07-09 DIAGNOSIS — R222 Localized swelling, mass and lump, trunk: Secondary | ICD-10-CM

## 2022-07-13 ENCOUNTER — Other Ambulatory Visit: Payer: Self-pay

## 2022-07-13 ENCOUNTER — Other Ambulatory Visit: Payer: Self-pay | Admitting: *Deleted

## 2022-07-13 ENCOUNTER — Ambulatory Visit: Payer: BC Managed Care – PPO | Admitting: General Surgery

## 2022-07-13 ENCOUNTER — Encounter: Payer: Self-pay | Admitting: General Surgery

## 2022-07-13 VITALS — BP 118/72 | HR 73 | Temp 97.9°F | Resp 16 | Ht 65.5 in | Wt 128.0 lb

## 2022-07-13 DIAGNOSIS — R2243 Localized swelling, mass and lump, lower limb, bilateral: Secondary | ICD-10-CM | POA: Diagnosis not present

## 2022-07-13 NOTE — Progress Notes (Signed)
Sherry [name]; [id number]; 07/14/1970   HPI Patient is a 52 year old white female who was referred to my care by Dr. Yong Channel for evaluation and treatment of subcutaneous nodules on her back and feet.  Patient states that she has hip issues and she noticed 2 subcutaneous lumps in her lower back on either side over the pelvic bone.  She also has 2 subcutaneous tender nodules on both the soles of her feet.  The right side is worse than the left.  They are tender to touch and cause her feet discomfort when walking or standing for prolonged period of time.  She denies any trauma to either region. Past Medical History:  Diagnosis Date   COPD (chronic obstructive pulmonary disease) ([place])    Depression    High cholesterol    Hypertension    Lung collapse    left    Past Surgical History:  Procedure Laterality Date   CHEST TUBE INSERTION     COLONOSCOPY WITH PROPOFOL N/A 03/17/2022   Procedure: COLONOSCOPY WITH PROPOFOL;  Surgeon: Harvel Quale, MD;  [facility];  Service: Gastroenterology;  Laterality: N/A;  930 ASA 2   ENDOMETRIAL ABLATION     POLYPECTOMY  03/17/2022   Procedure: POLYPECTOMY;  Surgeon: Montez Morita, Quillian Quince, MD;  Location: [facility];  Service: Gastroenterology;;    Family History  Problem Relation Age of Onset   Colon polyps Mother    Colon cancer Maternal Grandmother    Colon cancer Maternal Grandfather    Colon cancer Paternal Grandmother    Colon cancer Paternal Grandfather    Colon cancer Paternal Uncle     Current Outpatient Medications on File Prior to Visit  Medication Sig Dispense Refill   acetaminophen (TYLENOL) 500 MG tablet Take 1,000 mg by mouth every 6 (six) hours as needed (pain.).     albuterol (VENTOLIN HFA) 108 (90 Base) MCG/ACT inhaler Inhale 1-2 puffs into the lungs every 6 (six) hours as needed for shortness of breath or wheezing.     aspirin EC 81 MG tablet Take 81 mg by mouth daily. Swallow whole.     cholecalciferol  (VITAMIN D3) 25 MCG (1000 UNIT) tablet Take 1,000 Units by mouth daily.     clonazePAM (KLONOPIN) 0.5 MG tablet Take 0.5 mg by mouth in the morning, at noon, and at bedtime.     FLUoxetine (PROZAC) 10 MG capsule Take 10 mg by mouth in the morning.     hydrochlorothiazide (HYDRODIURIL) 25 MG tablet Take 25 mg by mouth every morning.     ibuprofen (ADVIL) 200 MG tablet Take 400 mg by mouth every 8 (eight) hours as needed (pain.).     metoprolol tartrate (LOPRESSOR) 50 MG tablet Take 0.5 tablets (25 mg total) by mouth 2 (two) times daily. 90 tablet 3   pantoprazole (PROTONIX) 40 MG tablet Take 40 mg by mouth daily before breakfast.     potassium chloride SA (KLOR-CON) 20 MEQ tablet Take 1 tablet (20 mEq total) by mouth 2 (two) times daily for 3 days. (Patient taking differently: Take 20 mEq by mouth in the morning.) 6 tablet 0   QUEtiapine (SEROQUEL) 400 MG tablet Take 1 tablet (400 mg total) by mouth at bedtime. (Patient taking differently: Take 800 mg by mouth at bedtime.) 10 tablet 0   sodium chloride (OCEAN) 0.65 % SOLN nasal spray Place 1 spray into both nostrils as needed for congestion.     [DISCONTINUED] hydrochlorothiazide (HYDRODIURIL) 25 MG tablet Take 25 mg by  mouth every morning.      [DISCONTINUED] linaclotide (LINZESS) 72 MCG capsule Take 1 capsule (72 mcg total) by mouth daily before breakfast. (Patient not taking: No sig reported) 30 capsule 3   [DISCONTINUED] metoprolol succinate (TOPROL-XL) 50 MG 24 hr tablet Take 50 mg by mouth every morning. Take with or immediately following a meal.     [DISCONTINUED] sertraline (ZOLOFT) 100 MG tablet Take 0.5 tablets (50 mg total) by mouth daily. (Patient not taking: Reported on 12/03/2020) 10 tablet 0   No current facility-administered medications on file prior to visit.    Allergies  Allergen Reactions   Dramamine [Dimenhydrinate] Swelling and Other (See Comments)    Made her feel really drunk. Swelling of tongue     Prednisone Other (See  Comments)    Feels like fingers are swelling    Social History   Substance and Sexual Activity  Alcohol Use Yes   Comment: occasionally     Social History   Tobacco Use  Smoking Status Every Day   Packs/day: 0.75   Years: 35.00   Total pack years: 26.25   Types: Cigarettes   Passive exposure: Current  Smokeless Tobacco Never    Review of Systems  Constitutional:  Positive for malaise/fatigue.  HENT:  Positive for sinus pain.   Eyes:  Positive for pain.  Respiratory:  Positive for cough, shortness of breath and wheezing.   Cardiovascular:  Positive for chest pain.  Gastrointestinal:  Positive for abdominal pain and heartburn.  Genitourinary: Negative.   Musculoskeletal:  Positive for back pain, joint pain and neck pain.  Skin: Negative.   Neurological:  Positive for sensory change and headaches.  Endo/Heme/Allergies: Negative.   Psychiatric/Behavioral: Negative.      Objective   Vitals:   07/13/22 1444  BP: 118/72  Pulse: 73  Resp: 16  Temp: 97.9 F (36.6 C)  SpO2: 96%    Physical Exam Vitals reviewed.  Constitutional:      Appearance: Normal appearance. She is normal weight. She is not ill-appearing.  HENT:     Head: Normocephalic and atraumatic.  Cardiovascular:     Rate and Rhythm: Normal rate and regular rhythm.     Heart sounds: Normal heart sounds. No murmur heard.    No friction rub. No gallop.  Pulmonary:     Effort: Pulmonary effort is normal. No respiratory distress.     Breath sounds: Normal breath sounds. No stridor. No wheezing, rhonchi or rales.  Musculoskeletal:     Comments: Small mobile 5 mm nodules in the lower back bilaterally over the iliac bone posteriorly.  They are somewhat tender to touch, but seem to be emanating from the bone itself.  They do not appear to be lipomas.  They are difficult to find.  In both feet in the midportion of the plantar aspect, subcutaneous nodules are noted which seem to be attached to the bone consistent  with ganglion cyst, right greater than left.  They are tender to touch.  Skin:    General: Skin is warm and dry.  Neurological:     General: No focal deficit present.     Mental Status: She is alert and oriented to person, place, and time.    Primary care notes reviewed Assessment  Subcutaneous nodules of both feet, probable ganglion cyst Subcutaneous nodules of the lower back, question etiology but they do not appear to be lipomatous lesions.  They do appear benign in nature. Plan  Given the above findings, I have  referred her to podiatry for further management and treatment of her subcutaneous nodules on the feet.  Concerning her lower back nodules, she also complains of a lot of hip pain bilaterally and I feel that referral to an orthopedic surgeon may be warranted.  I do not feel that excision of these subcutaneous nodules will be of benefit to the patient given her complaints.  She understands and agrees.  Follow-up here as needed.

## 2022-07-29 ENCOUNTER — Ambulatory Visit (HOSPITAL_COMMUNITY)
Admission: RE | Admit: 2022-07-29 | Discharge: 2022-07-29 | Disposition: A | Discharge status: Home / Self Care | Payer: BC Managed Care – PPO | Source: Ambulatory Visit | Attending: Internal Medicine | Admitting: Internal Medicine

## 2022-07-29 DIAGNOSIS — R918 Other nonspecific abnormal finding of lung field: Secondary | ICD-10-CM

## 2022-07-29 DIAGNOSIS — F17200 Nicotine dependence, unspecified, uncomplicated: Secondary | ICD-10-CM | POA: Diagnosis present

## 2022-07-29 DIAGNOSIS — R634 Abnormal weight loss: Secondary | ICD-10-CM | POA: Diagnosis not present

## 2022-07-29 MED ORDER — IOHEXOL 300 MG/ML  SOLN
75.0000 mL | Freq: Once | INTRAMUSCULAR | Status: AC | PRN
  Administered 2022-07-29: 60 mL via INTRAVENOUS

## 2022-08-06 ENCOUNTER — Ambulatory Visit: Payer: BC Managed Care – PPO | Admitting: Medical

## 2022-09-03 ENCOUNTER — Telehealth: Payer: Self-pay | Admitting: Cardiology

## 2022-09-03 ENCOUNTER — Telehealth: Payer: Self-pay | Admitting: Cardiovascular Disease

## 2022-09-03 MED ORDER — METOPROLOL TARTRATE 50 MG PO TABS: 25.0000 mg | ORAL_TABLET | Freq: Two times a day (BID) | ORAL | 0 refills | Status: DC

## 2022-09-03 NOTE — Telephone Encounter (Signed)
Patient's fiance called the answering service on Patient's behalf this evening requesting a refill of her metoprolol. Verified name and date of birth. Patient has not been seen in our office since 01/2021. Appears that patient has missed or canceled 4 appointments since that time. Discussed with fiance who reported that patient accidentally went to the [place] office when she was suppose to be in the [place] office, and she missed her appointment. I wrote a prescription for 45 days of metoprolol without refills. Informed patient that if she does not follow up within that time frame, she will not be given additional refills.   Margie Billet, PA-C 09/03/2022 5:36 PM

## 2022-09-03 NOTE — Telephone Encounter (Signed)
*  STAT* If patient is at the pharmacy, call can be transferred to refill team.   1. Which medications need to be refilled? (please list name of each medication and dose if known)   metoprolol tartrate (LOPRESSOR) 50 MG tablet (pink tablet)  2. Which pharmacy/location (including street and city if local pharmacy) is medication to be sent to?  [place], [place] [id number]  [address]   3. Do they need a 30 day or 90 day supply?   90 day  Fiance stated patient has 1 tablet left.

## 2022-09-03 NOTE — Telephone Encounter (Signed)
Left message to return call.  

## 2022-09-03 NOTE — Telephone Encounter (Signed)
Pt made PRN 01/2021 by Dr.Nishan  Pt made and cancelled appointments on 12/2021  02/2022 05/2022 [id number]    Per policy

## 2022-09-07 NOTE — Telephone Encounter (Signed)
Josue Hector, MD  Bernita Raisin, RN AGree       Previous Messages    ----- Message ----- From: Bernita Raisin, RN Sent: 09/03/2022   3:33 PM EST To: Josue Hector, MD Subject: refill                                        Pt made PRN 01/2021 by Dr.Nishan   Pt made and cancelled appointments on 12/2021  02/2022  05/2022  07/2022    Wants refills.  I will direct her to pcp if Dr.Nishan agrees

## 2022-09-07 NOTE — Telephone Encounter (Signed)
It appears patient had lopressor refilled by another provider

## 2022-09-21 ENCOUNTER — Encounter: Payer: Self-pay | Admitting: Internal Medicine

## 2022-09-21 ENCOUNTER — Ambulatory Visit: Payer: BC Managed Care – PPO | Admitting: Internal Medicine

## 2022-09-21 VITALS — BP 124/80 | HR 72 | Ht 65.5 in | Wt 120.4 lb

## 2022-09-21 DIAGNOSIS — J449 Chronic obstructive pulmonary disease, unspecified: Secondary | ICD-10-CM

## 2022-09-21 DIAGNOSIS — F17210 Nicotine dependence, cigarettes, uncomplicated: Secondary | ICD-10-CM

## 2022-09-21 DIAGNOSIS — M25512 Pain in left shoulder: Secondary | ICD-10-CM

## 2022-09-21 DIAGNOSIS — J441 Chronic obstructive pulmonary disease with (acute) exacerbation: Secondary | ICD-10-CM | POA: Insufficient documentation

## 2022-09-21 DIAGNOSIS — G8929 Other chronic pain: Secondary | ICD-10-CM

## 2022-09-21 DIAGNOSIS — R634 Abnormal weight loss: Secondary | ICD-10-CM | POA: Diagnosis not present

## 2022-09-21 MED ORDER — FAMOTIDINE 20 MG PO TABS: ORAL_TABLET | ORAL | 11 refills | Status: DC

## 2022-09-21 MED ORDER — METHYLPREDNISOLONE ACETATE 80 MG/ML IJ SUSP
80.0000 mg | Freq: Once | INTRAMUSCULAR | Status: AC
  Administered 2022-09-21: 80 mg via INTRAMUSCULAR

## 2022-09-21 MED ORDER — STIOLTO RESPIMAT 2.5-2.5 MCG/ACT IN AERS: 2.0000 | INHALATION_SPRAY | Freq: Every day | RESPIRATORY_TRACT | 11 refills | Status: DC

## 2022-09-21 NOTE — Assessment & Plan Note (Signed)
Onset x 2023  - referred to ortho 09/21/2022 >>>   No response to nsaid's/ one dose depomedrol 120 mg IM given

## 2022-09-21 NOTE — Assessment & Plan Note (Signed)
No obvious explanation for wt loss, defer w/u to PCP ? GI eval next?   Each maintenance medication was reviewed in detail including emphasizing most importantly the difference between maintenance and prns and under what circumstances the prns are to be triggered using an action plan format where appropriate.  Total time for H and P, chart review, counseling, reviewing hfa/smi device(s) , directly observing portions of ambulatory 02 saturation study/ and generating customized AVS unique to this office visit / same day charting > 30 min for multiple  refractory respiratory  symptoms of uncertain etiology

## 2022-09-21 NOTE — Assessment & Plan Note (Signed)
Active smoker  - CT 07/29/22 Mild emphysema  - 09/21/2022   Walked on RA  x  3  lap(s) =  approx 450  ft  @ fast pace, stopped due to end of study mild doe at last lap with lowest 02 sats 92%  - 09/21/2022  After extensive coaching inhaler device,  effectiveness =    80% try stiolto with max gerd rx   Pt is Group B in terms of symptom/risk and laba/lama therefore appropriate rx at this point >>>  try  stiolto and approp saba plus gerd rx pending return for full pfts

## 2022-09-21 NOTE — Assessment & Plan Note (Addendum)
4-5 min discussion re active cigarette smoking in addition to office E&M  Ask about tobacco use:   ongoing as is her finacee  Advise quitting   I took an extended  opportunity with this patient to outline the consequences of continued cigarette use  in airway disorders based on all the data we have from the multiple national lung health studies (perfomed over decades at millions of dollars in cost)  indicating that smoking cessation, not choice of inhalers or physicians, is the most important aspect of  hercare.   Assess willingness:  Not committed at this point Assist in quit attempt:  Per PCP when ready Arrange follow up:   Follow up per Primary Care planned

## 2022-09-21 NOTE — Progress Notes (Signed)
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     History of Present Illness  09/21/2022  Pulmonary/ 1st office eval/ Sherry [name] / [place] [facility]  Chief Complaint  Patient presents with   Consult    COPD./ Asthma/ Lung nodules/ Unexplained weight loss X 1 year   Dyspnea:  walking all day at work 12 h, 18 steps to get to appt so far can do s stopping  Cough: variable, not worse 1st thing in am but worse w/in 30 min and disturbs sleep >  min productive/ mucoid  Sleep: on Lside/ flat bed  SABA use: last used around 7 h prior to OV   02: none  Lung cancer screen: in program   No obvious day to day or daytime pattern/variability or assoc excess/ purulent sputum or mucus plugs or hemoptysis or cp or chest tightness, subjective wheeze or overt sinus or hb symptoms.     Also denies any obvious fluctuation of symptoms with weather or environmental changes or other aggravating or alleviating factors except as outlined above   No unusual exposure hx or h/o childhood pna/ asthma or knowledge of premature birth.  Current Allergies, Complete Past Medical History, Past Surgical History, Family History, and Social History were reviewed in [employer] record.  ROS  The following are not active complaints unless bolded Hoarseness, sore throat, dysphagia, dental problems, itching, sneezing,  nasal congestion or discharge of excess mucus or purulent secretions, ear ache,   fever, chills, sweats, unintended wt loss or wt gain, classically pleuritic or exertional cp,  orthopnea pnd or arm/hand swelling  or leg swelling, presyncope, palpitations, abdominal pain, anorexia, nausea, vomiting, diarrhea  or change in bowel habits or change in bladder habits, change in  stools or change in urine, dysuria, hematuria,  rash, arthralgias, visual complaints, headache, numbness, weakness or ataxia or problems with walking or coordination,  change in mood or  memory.           Past Medical History:  Diagnosis Date   COPD (chronic obstructive pulmonary disease) (HCC)    Depression    High cholesterol    Hypertension    Lung collapse    left    Outpatient Medications Prior to Visit  Medication Sig Dispense Refill   acetaminophen (TYLENOL) 500 MG tablet Take 1,000 mg by mouth every 6 (six) hours as needed (pain.).     albuterol (VENTOLIN HFA) 108 (90 Base) MCG/ACT inhaler Inhale 1-2 puffs into the lungs every 6 (six) hours as needed for shortness of breath or wheezing.     aspirin EC 81 MG tablet Take 81 mg by mouth daily. Swallow whole.     cholecalciferol (VITAMIN D3) 25 MCG (1000 UNIT) tablet Take 1,000 Units by mouth daily.     clonazePAM (KLONOPIN) 0.5 MG tablet Take 0.5 mg by mouth in the morning, at noon, and at bedtime.     FLUoxetine (PROZAC) 10 MG capsule Take 10 mg by mouth in the morning.     hydrochlorothiazide (HYDRODIURIL) 25 MG tablet Take 25 mg by mouth every morning.     ibuprofen (ADVIL) 200 MG tablet Take 400 mg by mouth every 8 (eight) hours as needed (pain.).  metoprolol tartrate (LOPRESSOR) 50 MG tablet Take 0.5 tablets (25 mg total) by mouth 2 (two) times daily. 45 tablet 0   pantoprazole (PROTONIX) 40 MG tablet Take 40 mg by mouth daily before breakfast.     potassium chloride SA (KLOR-CON) 20 MEQ tablet Take 1 tablet (20 mEq total) by mouth 2 (two) times daily for 3 days. (Patient taking differently: Take 20 mEq by mouth in the morning.) 6 tablet 0   QUEtiapine (SEROQUEL) 400 MG tablet Take 1 tablet (400 mg total) by mouth at bedtime. (Patient taking differently: Take 800 mg by mouth at bedtime.) 10 tablet 0   sodium chloride (OCEAN) 0.65 % SOLN nasal spray Place 1 spray into both nostrils as needed for congestion.     No  facility-administered medications prior to visit.     Objective:     BP 124/80   Pulse 72   Ht 5' 5.5" (1.664 m)   Wt 120 lb 6.4 oz (54.6 kg)   SpO2 96% Comment: ra  BMI 19.73 kg/m   SpO2: 96 % (ra)  Wt Readings from Last 3 Encounters:  09/21/22 120 lb 6.4 oz (54.6 kg)  07/13/22 128 lb (58.1 kg)  03/17/22 142 lb (64.4 kg)      Pleasant but anxious amb wf nad   HEENT :  Oropharynx  clear/ pseudowheeze      NECK :  without JVD/Nodes/TM/ nl carotid upstrokes bilaterally   LUNGS: no acc muscle use,  Mod barrel  contour chest wall with bilateral  insp exp rhonchi and  without cough on insp or exp maneuvers and mod  Hyperresonant  to  percussion bilaterally     CV:  RRR  no s3 or murmur or increase in P2, and no edema   ABD:  soft and nontender with pos mid insp Hoover's  in the supine position. No bruits or organomegaly appreciated, bowel sounds nl  MS:   Ext warm without deformities or   obvious joint restrictions , calf tenderness, cyanosis or clubbing - L pectoralis insertion into should very tender s sts or ecchyomosis/ adenopathy  SKIN: warm and dry without lesions    NEURO:  alert, approp, nl sensorium with  no motor or cerebellar deficits apparent.           I personally reviewed images and agree with radiology impression as follows:   Chest LDSCT     07/29/22 1. Mild emphysema. 2. Mild progression of peripheral fibrotic changes in the lower lungs. 3. Stable mildly enlarged precarinal lymph node.  Emphysema (ICD10-J43.9). My review:  emphysema is mild  Labs ordered 09/21/2022  :  allergy profile   alpha one AT phenotype        Assessment   COPD GOLD ? / active Active smoker  - CT 07/29/22 Mild emphysema  - 09/21/2022   Walked on RA  x  3  lap(s) =  approx 450  ft  @ fast pace, stopped due to end of study mild doe at last lap with lowest 02 sats 92%  - 09/21/2022  After extensive coaching inhaler device,  effectiveness =    80% try stiolto with max gerd rx    Pt is Group B in terms of symptom/risk and laba/lama therefore appropriate rx at this point >>>  try  stiolto and approp saba plus gerd rx pending return for full pfts       Shoulder pain, left Onset x 2023  - referred to ortho 09/21/2022 >>>  No response to nsaid's/ one dose depomedrol 120 mg IM given   Cigarette smoker 4-5 min discussion re active cigarette smoking in addition to office E&M  Ask about tobacco use:   ongoing as is her finacee  Advise quitting   I took an extended  opportunity with this patient to outline the consequences of continued cigarette use  in airway disorders based on all the data we have from the multiple national lung health studies (perfomed over decades at millions of dollars in cost)  indicating that smoking cessation, not choice of inhalers or physicians, is the most important aspect of  hercare.   Assess willingness:  Not committed at this point Assist in quit attempt:  Per PCP when ready Arrange follow up:   Follow up per Primary Care planned          Recent unexplained weight loss No obvious explanation for wt loss, defer w/u to PCP ? GI eval next?   Each maintenance medication was reviewed in detail including emphasizing most importantly the difference between maintenance and prns and under what circumstances the prns are to be triggered using an action plan format where appropriate.  Total time for H and P, chart review, counseling, reviewing hfa/smi device(s) , directly observing portions of ambulatory 02 saturation study/ and generating customized AVS unique to this office visit / same day charting > 30 min for multiple  refractory respiratory  symptoms of uncertain etiology                   Christinia Gully, MD 09/21/2022

## 2022-09-21 NOTE — Patient Instructions (Addendum)
Plan A = Automatic = Always=    Stiolto 2 puffs each am   Work on inhaler technique:  relax and gently blow all the way out then take a nice smooth full deep breath back in, triggering the inhaler at same time you start breathing in.  Hold breath in for at least  5 seconds if you can. Rinse and gargle with water when done.  If mouth or throat bother you at all,  try brushing teeth/gums/tongue with arm and hammer toothpaste/ make a slurry and gargle and spit out.     Plan B = Backup (to supplement plan A, not to replace it) Only use your albuterol inhaler as a rescue medication to be used if you can't catch your breath by resting or doing a relaxed purse lip breathing pattern.  - The less you use it, the better it will work when you need it. - Ok to use the inhaler up to 2 puffs  every 4 hours if you must but call for appointment if use goes up over your usual need - Don't leave home without it !!  (think of it like the spare tire or starter fluid for your car)      For cough > mucinex dm 1200 mg twice daily as needed   My office will be contacting you by phone for referral to orthopedic surgery   - if you don't hear back from my office within one week please call us back or notify us thru MyChart and we'll address it right away.   Pantoprazole (protonix) 40 mg   Take  30-60 min before first meal of the day and Pepcid (famotidine)  20 mg after supper until return to office - this is the best way to tell whether stomach acid is contributing to your problem.    Please remember to go to the lab department   for your tests - we will call you with the results when they are available.      Please schedule a follow up office visit in 6 weeks, call sooner if needed - bring inhalers

## 2022-09-27 LAB — ALPHA-1-ANTITRYPSIN PHENOTYP: A-1 Antitrypsin: 206 mg/dL — ABNORMAL HIGH (ref 101–187)

## 2022-09-27 LAB — CBC WITH DIFFERENTIAL/PLATELET
Basophils Absolute: 0 10*3/uL (ref 0.0–0.2)
Basos: 0 %
EOS (ABSOLUTE): 0 10*3/uL (ref 0.0–0.4)
Eos: 0 %
Hematocrit: 41.5 % (ref 34.0–46.6)
Hemoglobin: 13.8 g/dL (ref 11.1–15.9)
Immature Grans (Abs): 0 10*3/uL (ref 0.0–0.1)
Immature Granulocytes: 0 %
Lymphocytes Absolute: 2 10*3/uL (ref 0.7–3.1)
Lymphs: 32 %
MCH: 31.9 pg (ref 26.6–33.0)
MCHC: 33.3 g/dL (ref 31.5–35.7)
MCV: 96 fL (ref 79–97)
Monocytes Absolute: 0.5 10*3/uL (ref 0.1–0.9)
Monocytes: 8 %
Neutrophils Absolute: 3.7 10*3/uL (ref 1.4–7.0)
Neutrophils: 60 %
Platelets: 354 10*3/uL (ref 150–450)
RBC: 4.33 x10E6/uL (ref 3.77–5.28)
RDW: 13.6 % (ref 11.7–15.4)
WBC: 6.3 10*3/uL (ref 3.4–10.8)

## 2022-09-27 LAB — BRAIN NATRIURETIC PEPTIDE: BNP: 99.2 pg/mL (ref 0.0–100.0)

## 2022-09-27 LAB — SEDIMENTATION RATE: Sed Rate: 24 mm/hr (ref 0–40)

## 2022-10-11 ENCOUNTER — Ambulatory Visit: Payer: BC Managed Care – PPO | Admitting: Orthopedic Surgery

## 2022-10-16 ENCOUNTER — Other Ambulatory Visit: Payer: Self-pay | Admitting: Cardiology

## 2022-10-25 ENCOUNTER — Other Ambulatory Visit: Payer: Self-pay | Admitting: Cardiovascular Disease

## 2022-10-28 ENCOUNTER — Other Ambulatory Visit (INDEPENDENT_AMBULATORY_CARE_PROVIDER_SITE_OTHER): Payer: BC Managed Care – PPO

## 2022-10-28 ENCOUNTER — Encounter: Payer: Self-pay | Admitting: Orthopedic Surgery

## 2022-10-28 ENCOUNTER — Ambulatory Visit (INDEPENDENT_AMBULATORY_CARE_PROVIDER_SITE_OTHER): Payer: BC Managed Care – PPO | Admitting: Orthopedic Surgery

## 2022-10-28 ENCOUNTER — Telehealth: Payer: Self-pay | Admitting: Radiology

## 2022-10-28 VITALS — BP 123/85 | HR 75 | Ht 65.5 in | Wt 117.0 lb

## 2022-10-28 DIAGNOSIS — M25512 Pain in left shoulder: Secondary | ICD-10-CM | POA: Diagnosis not present

## 2022-10-28 DIAGNOSIS — M25552 Pain in left hip: Secondary | ICD-10-CM

## 2022-10-28 DIAGNOSIS — M545 Low back pain, unspecified: Secondary | ICD-10-CM

## 2022-10-28 DIAGNOSIS — G8929 Other chronic pain: Secondary | ICD-10-CM

## 2022-10-28 MED ORDER — METHYLPREDNISOLONE ACETATE 40 MG/ML IJ SUSP
40.0000 mg | Freq: Once | INTRAMUSCULAR | Status: AC
  Administered 2022-10-28: 40 mg via INTRA_ARTICULAR

## 2022-10-28 NOTE — Telephone Encounter (Signed)
SEND HER TO DR Christell Constant

## 2022-10-28 NOTE — Patient Instructions (Addendum)
OOW X 2 WEEKS TO REST THE SHOULDER, MAY RETURN TO WORK AFTER 2 WEEKS   TAKE TYLENOL AND ADVIL FOR PAIN USE BEN GAY AS NEEDED   REST THE ARM     Physical therapy has been ordered for you at [facility]. They should call you to schedule, [phone] is the phone number to call, if you want to call to schedule.

## 2022-10-28 NOTE — Telephone Encounter (Addendum)
I called patient to advise her referral made to [facility] will call her, Patient is asking for something for the pain

## 2022-10-28 NOTE — Telephone Encounter (Signed)
-----   Message from Raina Mina sent at 10/28/2022  2:22 PM EDT ----- Regarding: Referral for back specialist Joe Tanney,   Satomi wants to know what back specialist Dr. Romeo Apple is referring her to.  There wasn't anything about that in her AVS, just OT  Please call her and advise.  Thanks   [employer]

## 2022-10-28 NOTE — Telephone Encounter (Signed)
We are referring you to [facility] from [facility] Office address is [address] [place] The phone number is [phone]  The office will call you with an appointment Dr. Moore   

## 2022-10-28 NOTE — Progress Notes (Signed)
Chief Complaint  Patient presents with   Shoulder Pain    Left for several months states also has left sided chest pains      Office Visit Note   Patient: Sherry [name]           Date of Birth: 09/02/1970           MRN: [id number] Visit Date: 10/28/2022 Requested by: Nyoka Cowden, MD [address] [place],  [place] [address] PCP: Alvina Filbert, MD  Subjective: Chief Complaint  Patient presents with   Shoulder Pain    Left for several months states also has left sided chest pains     HPI:  52 year old female who has a strenuous job at a [employer] comes in with left shoulder pain chest wall pain  Reports no trauma  Other than over-the-counter medications such as naproxen Aleve ibuprofen Tylenol has not gotten any relief.  No therapy or injections have been given  Complains of groin pain and pain over the left trochanter of the hip              ROS:  Assessment & Plan:  Images personally read and my interpretation : Shoulder x-ray is normal  Hip x-rays normal    Visit Diagnoses:  1. Acute pain of left shoulder   2. Acute pain of left hip     Plan: At this point it is advisable to inject the left shoulder and then to give her period of rest from her job  As far as her hip goes the hip x-ray and exam is normal  I referred her to spine specialist for treatment if needed  Noted on a CT scan of her abdomen and pelvis you can see extensive narrowing of the L5-S1 disc space which may be the source of her discomfort.  THERAPY IS WORTH WHILE AS IS 2 WEEKS OF REST FROM WORK   Procedure note the subacromial injection shoulder left   Verbal consent was obtained to inject the  Left   Shoulder  Timeout was completed to confirm the injection site is a subacromial space of the  left  shoulder  Medication used Depo-Medrol 40 mg and lidocaine 1% 3 cc  Anesthesia was provided by ethyl chloride  The injection was performed in the left  posterior subacromial  space. After pinning the skin with alcohol and anesthetized the skin with ethyl chloride the subacromial space was injected using a 20-gauge needle. There were no complications  Sterile dressing was applied.          Follow-Up Instructions: No follow-ups on file.   Orders:  No orders of the defined types were placed in this encounter.     Objective: Vital Signs: BP 123/85   Pulse 75   Ht 5' 5.5" (1.664 m)   Wt 117 lb (53.1 kg)   BMI 19.17 kg/m   Physical Exam Vitals and nursing note reviewed.  Constitutional:      Appearance: Normal appearance.  HENT:     Head: Normocephalic and atraumatic.  Eyes:     General: No scleral icterus.       Right eye: No discharge.        Left eye: No discharge.     Extraocular Movements: Extraocular movements intact.     Conjunctiva/sclera: Conjunctivae normal.     Pupils: Pupils are equal, round, and reactive to light.  Cardiovascular:     Rate and Rhythm: Normal rate.     Pulses: Normal  pulses.  Skin:    General: Skin is warm and dry.     Capillary Refill: Capillary refill takes less than 2 seconds.  Neurological:     General: No focal deficit present.     Mental Status: She is alert and oriented to person, place, and time.  Psychiatric:        Mood and Affect: Mood normal.        Behavior: Behavior normal.        Thought Content: Thought content normal.        Judgment: Judgment normal.      Ortho Exam Shoulder exam in terms of range of motion is benign she does have some pectoralis tenderness some anterior shoulder tenderness but no evidence of cuff tear no instability  She has tenderness over the left greater trochanter left buttock left SI joint.  She has normal range of motion in both hips with no pain with flexion of the hip  Specialty Comments:  No specialty comments available.  Imaging: DG HIP UNILAT WITH PELVIS 2-3 VIEWS LEFT  Result Date: 10/28/2022 Image of the left hip and pelvis secondary to left hip  pain X-rays show normal femoral head normal acetabulum Normal head neck offset Normal hip joint   DG Shoulder Left  Result Date: 10/28/2022 Imaging left shoulder Apparently left shoulder pain for several months no trauma 3 views include axillary AP and lateral Normal bony anatomy.  No chest wall lesions.  Normal glenoid normal humeral head normal humerus.  Normal acromion.  Normal AC joint. Impression normal x-ray     PMFS History: Patient Active Problem List   Diagnosis Date Noted   Shoulder pain, left 09/21/2022   COPD GOLD ? / active 09/21/2022   Cigarette smoker 09/21/2022   Recent unexplained weight loss 09/21/2022   Preventative health care 06/21/2019   GERD (gastroesophageal reflux disease) 06/21/2019   Constipation 06/21/2019   Past Medical History:  Diagnosis Date   COPD (chronic obstructive pulmonary disease)    Depression    High cholesterol    Hypertension    Lung collapse    left    Family History  Problem Relation Age of Onset   Colon polyps Mother    Colon cancer Maternal Grandmother    Colon cancer Maternal Grandfather    Colon cancer Paternal Grandmother    Colon cancer Paternal Grandfather    Colon cancer Paternal Uncle     Past Surgical History:  Procedure Laterality Date   CHEST TUBE INSERTION     COLONOSCOPY WITH PROPOFOL N/A 03/17/2022   Procedure: COLONOSCOPY WITH PROPOFOL;  Surgeon: Dolores Frame, MD;  Location: [facility];  Service: Gastroenterology;  Laterality: N/A;  930 ASA 2   ENDOMETRIAL ABLATION     POLYPECTOMY  03/17/2022   Procedure: POLYPECTOMY;  Surgeon: Dolores Frame, MD;  Location: [facility];  Service: Gastroenterology;;   Social History   Occupational History   Not on file  Tobacco Use   Smoking status: Every Day    Packs/day: 0.75    Years: 35.00    Additional pack years: 0.00    Total pack years: 26.25    Types: Cigarettes    Passive exposure: Current   Smokeless tobacco: Never  Vaping Use    Vaping Use: Never used  Substance and Sexual Activity   Alcohol use: Yes    Comment: occasionally    Drug use: Never   Sexual activity: Yes

## 2022-11-01 NOTE — Progress Notes (Unsigned)
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   S/p ptx age 52     History of Present Illness  09/21/2022  Pulmonary/ 1st office eval/ Sherry [name] / Sherry [name] Complaint  Patient presents with   Consult    COPD./ Asthma/ Lung nodules/ Unexplained weight loss X 1 year   Dyspnea:  walking all day at work 12 h, 18 steps to get to appt so far can do s stopping  Cough: variable, not worse 1st thing in am but worse w/in 30 min and disturbs sleep >  min productive/ mucoid  Sleep: on L side/ flat bed  SABA use: last used around 7 h prior to OV   02: none  Lung cancer screen: in program  Rec Plan A = Sherry [name] = Always=    Stiolto 2 puffs each am  Work on inhaler technique:  Plan B = Sherry (to supplement plan A, not to replace it) Only use your albuterol inhaler as a rescue medication  For cough > mucinex dm 1200 mg twice daily as needed  My office will be contacting you by phone for referral to orthopedic surgery   Pantoprazole (protonix) 40 mg   Take  30-60 min before first meal of the day and Pepcid (famotidine)  20 mg after supper until return to office -  Please remember to go to the lab department   EOS  0  alpha one AT phenotype  MM  level 206       Please schedule a follow up office visit in 6 weeks, call sooner if needed - bring inhalers    11/02/2022  f/u ov/Sherry [name] office/Sherry [name] re: GOLD? copd maint on stiolto  and did not  bring inhalers as requested  Chief Complaint  Patient presents with   Follow-up    Pt f/u states that her breathing is better, no questions or concerns.   Dyspnea:  much less active due to  L hip pain  Cough: better despite still smoking  Sleeping: bed is flat 2 pillows/ min am sputum  SABA use: maybe once a day p  exertion .  02: none       No obvious day to day or daytime variability or assoc excess/ purulent sputum or mucus plugs or hemoptysis or cp or chest tightness, subjective wheeze or overt sinus or hb symptoms.   Sleeping  without nocturnal  or early am exacerbation  of respiratory  c/o's or need for noct saba. Also denies any obvious fluctuation of symptoms with weather or environmental changes or other aggravating or alleviating factors except as outlined above   No unusual exposure hx or h/o childhood pna/ asthma or knowledge of premature birth.  Current Allergies, Complete Past Medical History, Past Surgical History, Family History, and Social History were reviewed in Sherry [name] record.  ROS  The following are not active complaints unless bolded Hoarseness, sore throat, dysphagia, dental problems, itching, sneezing,  nasal congestion or discharge of excess mucus or purulent secretions, ear ache,   fever, chills, sweats, unintended wt loss or wt gain, classically pleuritic or exertional cp,  orthopnea pnd or arm/hand swelling  or leg swelling, presyncope, palpitations, abdominal pain, anorexia, nausea, vomiting,  diarrhea  or change in bowel habits or change in bladder habits, change in stools or change in urine, dysuria, hematuria,  rash, arthralgias, visual complaints, headache, numbness, weakness or ataxia or problems with walking or coordination,  change in mood or  memory.        Current Meds  Medication Sig   acetaminophen (TYLENOL) 500 MG tablet Take 1,000 mg by mouth every 6 (six) hours as needed (pain.).   albuterol (VENTOLIN HFA) 108 (90 Base) MCG/ACT inhaler Inhale 1-2 puffs into the lungs every 6 (six) hours as needed for shortness of breath or wheezing.   aspirin EC 81 MG tablet Take 81 mg by mouth daily. Swallow whole.   cholecalciferol (VITAMIN D3) 25 MCG (1000 UNIT) tablet Take 1,000 Units by mouth daily.   clonazePAM (KLONOPIN) 0.5 MG tablet Take 0.5 mg  by mouth in the morning, at noon, and at bedtime.   famotidine (PEPCID) 20 MG tablet One after supper   FLUoxetine (PROZAC) 10 MG capsule Take 10 mg by mouth in the morning.   hydrochlorothiazide (HYDRODIURIL) 25 MG tablet Take 25 mg by mouth every morning.   ibuprofen (ADVIL) 200 MG tablet Take 400 mg by mouth every 8 (eight) hours as needed (pain.).   metoprolol tartrate (LOPRESSOR) 50 MG tablet Take 0.5 tablets (25 mg total) by mouth 2 (two) times daily.   pantoprazole (PROTONIX) 40 MG tablet Take 40 mg by mouth daily before breakfast.   potassium chloride SA (KLOR-CON) 20 MEQ tablet Take 1 tablet (20 mEq total) by mouth 2 (two) times daily for 3 days. (Patient taking differently: Take 20 mEq by mouth in the morning.)   QUEtiapine (SEROQUEL) 400 MG tablet Take 1 tablet (400 mg total) by mouth at bedtime. (Patient taking differently: Take 800 mg by mouth at bedtime.)   sodium chloride (OCEAN) 0.65 % SOLN nasal spray Place 1 spray into both nostrils as needed for congestion.   Tiotropium Bromide-Olodaterol (STIOLTO RESPIMAT) 2.5-2.5 MCG/ACT AERS Inhale 2 puffs into the lungs daily.            Objective:    wts  11/02/2022        116    09/21/22 120 lb 6.4 oz (54.6 kg)  07/13/22 128 lb (58.1 kg)  03/17/22 142 lb (64.4 kg)   Vital signs reviewed  11/02/2022  - Note at rest 02 sats  96% on RA   General appearance:    thin amb wf nad    HEENT : Oropharynx  clear       NECK :  without  apparent JVD/ palpable Nodes/TM    LUNGS: no acc muscle use,  Min barrel  contour chest wall with bilateral  slightly decreased bs s audible wheeze and  without cough on insp or exp maneuvers and min  Hyperresonant  to  percussion bilaterally    CV:  RRR  no s3 or murmur or increase in P2, and no edema   ABD:  soft and nontender with pos end  insp Hoover's  in the supine position.  No bruits or organomegaly appreciated   MS:  Nl gait/ ext warm without deformities Or obvious joint restrictions  calf  tenderness, cyanosis or - Mild clubbing     SKIN: warm and dry without lesions    NEURO:  alert, approp, nl sensorium with  no motor or cerebellar deficits apparent.                  I personally reviewed  images and agree with radiology impression as follows:   Chest CTw contrast 07/29/22    1. Mild emphysema. 2. Mild progression of peripheral fibrotic changes in the lower lungs. 3. Stable mildly enlarged precarinal lymph node.   Emphysema (ICD10-J43.9).            Assessment

## 2022-11-02 ENCOUNTER — Encounter: Payer: Self-pay | Admitting: Internal Medicine

## 2022-11-02 ENCOUNTER — Ambulatory Visit: Payer: BC Managed Care – PPO | Admitting: Internal Medicine

## 2022-11-02 VITALS — BP 115/73 | HR 64 | Ht 65.0 in | Wt 116.6 lb

## 2022-11-02 DIAGNOSIS — F17210 Nicotine dependence, cigarettes, uncomplicated: Secondary | ICD-10-CM

## 2022-11-02 DIAGNOSIS — J449 Chronic obstructive pulmonary disease, unspecified: Secondary | ICD-10-CM | POA: Diagnosis not present

## 2022-11-02 MED ORDER — ALBUTEROL SULFATE HFA 108 (90 BASE) MCG/ACT IN AERS: 1.0000 | INHALATION_SPRAY | RESPIRATORY_TRACT | 11 refills | Status: DC | PRN

## 2022-11-02 NOTE — Patient Instructions (Addendum)
No change in medications  - Bring Inhalers with you to next visit   Please schedule a follow up visit in 3 months but call sooner if needed PFT on return

## 2022-11-03 ENCOUNTER — Encounter: Payer: Self-pay | Admitting: Internal Medicine

## 2022-11-03 NOTE — Assessment & Plan Note (Signed)
Active smoker/MM  - CT 07/29/22 Mild emphysema  - 09/21/2022   Walked on RA  x  3  lap(s) =  approx 450  ft  @ fast pace, stopped due to end of study mild doe at last lap with lowest 02 sats 92%  - 09/21/2022  After extensive coaching inhaler device,  effectiveness =    80% try stiolto with max gerd rx  -  31/2/24  :  EOS  0  alpha one AT phenotype  MM  level 206   Clinically Pt is Group B in terms of symptom/risk and laba/lama therefore appropriate rx at this point >>>  continue stiolto and approp saba:  Re SABA :  I spent extra time with pt today reviewing appropriate use of albuterol for prn use on exertion with the following points: 1) saba is for relief of sob that does not improve by walking a slower pace or resting but rather if the pt does not improve after trying this first. 2) If the pt is convinced, as many are, that saba helps recover from activity faster then it's easy to tell if this is the case by re-challenging : ie stop, take the inhaler, then p 5 minutes try the exact same activity (intensity of workload) that just caused the symptoms and see if they are substantially diminished or not after saba 3) if there is an activity that reproducibly causes the symptoms, try the saba 15 min before the activity on alternate days   If in fact the saba really does help, then fine to continue to use it prn but advised may need to look closer at the maintenance regimen being used to achieve better control of airways disease with exertion.    F/u with all inhalers and pfts in 3, call sooner if needed         Each maintenance medication was reviewed in detail including emphasizing most importantly the difference between maintenance and prns and under what circumstances the prns are to be triggered using an action plan format where appropriate.  Total time for H and P, chart review, counseling, reviewing smi/hfa device(s) and generating customized AVS unique to this office visit / same day  charting = 25 min

## 2022-11-03 NOTE — Assessment & Plan Note (Signed)
Counseled re importance of smoking cessation but did not meet time criteria for separate billing   °

## 2022-11-08 ENCOUNTER — Telehealth: Payer: Self-pay | Admitting: Orthopedic Surgery

## 2022-11-08 NOTE — Telephone Encounter (Signed)
[employer] Absence forms received. To [employer]. 

## 2022-11-11 ENCOUNTER — Ambulatory Visit (HOSPITAL_COMMUNITY): Payer: BC Managed Care – PPO | Admitting: Occupational Therapy

## 2022-11-18 ENCOUNTER — Ambulatory Visit: Payer: BC Managed Care – PPO | Admitting: Orthopedic Surgery

## 2022-11-19 ENCOUNTER — Emergency Department (HOSPITAL_COMMUNITY)
Admission: EM | Admit: 2022-11-19 | Discharge: 2022-11-19 | Disposition: A | Discharge status: Home / Self Care | Payer: BC Managed Care – PPO | Attending: Emergency Medicine | Admitting: Emergency Medicine

## 2022-11-19 ENCOUNTER — Other Ambulatory Visit: Payer: Self-pay

## 2022-11-19 ENCOUNTER — Emergency Department (HOSPITAL_COMMUNITY): Payer: BC Managed Care – PPO

## 2022-11-19 ENCOUNTER — Encounter (HOSPITAL_COMMUNITY): Payer: Self-pay | Admitting: *Deleted

## 2022-11-19 DIAGNOSIS — S7002XA Contusion of left hip, initial encounter: Secondary | ICD-10-CM | POA: Diagnosis not present

## 2022-11-19 DIAGNOSIS — Z7982 Long term (current) use of aspirin: Secondary | ICD-10-CM | POA: Diagnosis not present

## 2022-11-19 DIAGNOSIS — W010XXA Fall on same level from slipping, tripping and stumbling without subsequent striking against object, initial encounter: Secondary | ICD-10-CM | POA: Diagnosis not present

## 2022-11-19 DIAGNOSIS — S0990XA Unspecified injury of head, initial encounter: Secondary | ICD-10-CM | POA: Diagnosis not present

## 2022-11-19 DIAGNOSIS — Z79899 Other long term (current) drug therapy: Secondary | ICD-10-CM | POA: Diagnosis not present

## 2022-11-19 DIAGNOSIS — W19XXXA Unspecified fall, initial encounter: Secondary | ICD-10-CM

## 2022-11-19 MED ORDER — HYDROCODONE-ACETAMINOPHEN 5-325 MG PO TABS: 1.0000 | ORAL_TABLET | Freq: Four times a day (QID) | ORAL | 0 refills | Status: DC | PRN

## 2022-11-19 MED ORDER — HYDROCODONE-ACETAMINOPHEN 5-325 MG PO TABS
1.0000 | ORAL_TABLET | Freq: Once | ORAL | Status: AC
  Administered 2022-11-19: 1 via ORAL
  Filled 2022-11-19: qty 1

## 2022-11-19 NOTE — ED Triage Notes (Signed)
Pt states she slipped and fell landing on her head and her left hip

## 2022-11-19 NOTE — Discharge Instructions (Addendum)
Your x-ray and CT scans today are negative for acute injury.  I encourage you to apply ice packs especially to your left hip region as much is as comfortable for the next several days.  You can add a heating pad starting on Monday for 20 minutes several times daily as well.  You have been prescribed a pain reliever, use caution with this medication as it will make you drowsy, do not drive within 4 hours of taking this medication.  I also recommend taking Motrin 3 times daily.

## 2022-11-20 ENCOUNTER — Other Ambulatory Visit: Payer: Self-pay | Admitting: Cardiology

## 2022-11-21 NOTE — ED Provider Notes (Signed)
[facility] AT [facility] Provider Note   CSN: [id number] Arrival date & time: 11/19/22  1125     History  Chief Complaint  Patient presents with   Sherry [name] is a 52 y.o. 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  She slipped on her front pavement, landed on her left hip and hit her forehead into the step edge of her front porch.  She denies neck pain.  She denies LOC but has had a significant headache since the fall.  She also has complaints of persistent pain in the left hip, although states she has been ambulatory but with discomfort.  She denies weakness or numbness in her extremities, she denies dizziness, nausea or vomiting, focal weakness.  She is not on anticoagulants.  She took Advil prior to arrival with no improvement in her symptoms.  The history is provided by the patient.       Home Medications Prior to Admission medications   Medication Sig Start Date End Date Taking? Authorizing Provider  HYDROcodone-acetaminophen (NORCO/VICODIN) 5-325 MG tablet Take 1 tablet by mouth every 6 (six) hours as needed. 11/19/22  Yes Debe Anfinson, Raynelle Fanning, PA-C  acetaminophen (TYLENOL) 500 MG tablet Take 1,000 mg by mouth every 6 (six) hours as needed (pain.).    [provider]  albuterol (VENTOLIN HFA) 108 (90 Base) MCG/ACT inhaler Inhale 1-2 puffs into the lungs every 4 (four) hours as needed for shortness of breath or wheezing. 11/02/22   Nyoka Cowden, MD  aspirin EC 81 MG tablet Take 81 mg by mouth daily. Swallow whole.    [provider]  cholecalciferol (VITAMIN D3) 25 MCG (1000 UNIT) tablet Take 1,000 Units by mouth daily.    [provider]  clonazePAM (KLONOPIN) 0.5 MG tablet Take 0.5 mg by mouth in the morning, at noon, and at bedtime.    [provider]  famotidine (PEPCID) 20 MG tablet One after supper 09/21/22   Nyoka Cowden, MD  FLUoxetine (PROZAC) 10 MG capsule Take 10 mg by mouth in the morning.    [provider]  hydrochlorothiazide (HYDRODIURIL) 25 MG tablet Take 25 mg by mouth every morning. 07/13/22   [provider]  ibuprofen (ADVIL) 200 MG tablet Take 400 mg by mouth every 8 (eight) hours as needed (pain.).    [provider]  metoprolol tartrate (LOPRESSOR) 50 MG tablet Take 0.5 tablets (25 mg total) by mouth 2 (two) times daily. 10/26/22   Jonita Albee, PA-C  pantoprazole (PROTONIX) 40 MG tablet Take 40 mg by mouth daily before breakfast.    [provider]  potassium chloride SA (KLOR-CON) 20 MEQ tablet Take 1 tablet (20 mEq total) by mouth 2 (two) times daily for 3 days. Patient taking differently: Take 20 mEq by mouth in the morning. 09/09/20 03/10/24  Sabas Sous, MD  QUEtiapine (SEROQUEL) 400 MG tablet Take 1 tablet (400 mg total) by mouth at bedtime. Patient taking differently: Take 800 mg by mouth at bedtime. 06/06/18   Burgess Amor, PA-C  sodium chloride (OCEAN) 0.65 % SOLN nasal spray Place 1 spray into both nostrils as needed for congestion.    [provider]  Tiotropium Bromide-Olodaterol (STIOLTO RESPIMAT) 2.5-2.5 MCG/ACT AERS Inhale 2 puffs into the lungs daily. 09/21/22   Nyoka Cowden, MD  linaclotide Karlene Einstein) 72 MCG capsule Take 1 capsule (  72 mcg total) by mouth daily before breakfast. Patient not taking: No sig reported 07/27/19 12/06/20  Anice Paganini, NP  metoprolol succinate (TOPROL-XL) 50 MG 24 hr tablet Take 50 mg by mouth every morning. Take with or immediately following a meal.  12/06/20  [provider]  sertraline (ZOLOFT) 100 MG tablet Take 0.5 tablets (50 mg total) by mouth daily. Patient not taking: Reported on 12/03/2020 06/06/18 12/06/20  Burgess Amor, PA-C      Allergies    Dramamine [dimenhydrinate] and Prednisone    Review of Systems   Review of Systems  Constitutional:  Negative for chills and fever.   HENT:  Positive for facial swelling.   Musculoskeletal:  Positive for arthralgias. Negative for joint swelling and myalgias.  Neurological:  Positive for headaches. Negative for dizziness, weakness and numbness.  All other systems reviewed and are negative.   Physical Exam Updated Vital Signs BP 110/70   Pulse 66   Temp 98.9 F (37.2 C) (Oral)   Resp 18   Ht 5\' 5"  (1.651 m)   Wt 52.8 kg   SpO2 96%   BMI 19.37 kg/m  Physical Exam Vitals and nursing note reviewed.  Constitutional:      Appearance: She is well-developed.  HENT:     Head: Normocephalic.     Comments: Abrasion/hematoma noted right forehead.  Hemostatic. Eyes:     Conjunctiva/sclera: Conjunctivae normal.  Cardiovascular:     Rate and Rhythm: Normal rate and regular rhythm.     Heart sounds: Normal heart sounds.  Pulmonary:     Effort: Pulmonary effort is normal.     Breath sounds: Normal breath sounds. No wheezing.  Abdominal:     General: Bowel sounds are normal.     Palpations: Abdomen is soft.     Tenderness: There is no abdominal tenderness.  Musculoskeletal:        General: Tenderness present.     Cervical back: Normal range of motion.     Left hip: Bony tenderness present. No deformity. Decreased range of motion.       Legs:  Skin:    General: Skin is warm and dry.  Neurological:     Mental Status: She is alert.     ED Results / Procedures / Treatments   Labs (all labs ordered are listed, but only abnormal results are displayed) Labs Reviewed - No data to display  EKG None  Radiology No results found. Results for orders placed or performed in visit on 09/21/22  Sedimentation rate  Result Value Ref Range   Sed Rate 24 0 - 40 mm/hr  CBC with Differential/Platelet  Result Value Ref Range   WBC 6.3 3.4 - 10.8 x10E3/uL   RBC 4.33 3.77 - 5.28 x10E6/uL   Hemoglobin 13.8 11.1 - 15.9 g/dL   Hematocrit 84.6 96.2 - 46.6 %   MCV 96 79 - 97 fL   MCH 31.9 26.6 - 33.0 pg   MCHC 33.3 31.5 -  35.7 g/dL   RDW 95.2 84.1 - 32.4 %   Platelets 354 150 - 450 x10E3/uL   Neutrophils 60 Not Estab. %   Lymphs 32 Not Estab. %   Monocytes 8 Not Estab. %   Eos 0 Not Estab. %   Basos 0 Not Estab. %   Neutrophils Absolute 3.7 1.4 - 7.0 x10E3/uL   Lymphocytes Absolute 2.0 0.7 - 3.1 x10E3/uL   Monocytes Absolute 0.5 0.1 - 0.9 x10E3/uL   EOS (ABSOLUTE) 0.0 0.0 - 0.4  x10E3/uL   Basophils Absolute 0.0 0.0 - 0.2 x10E3/uL   Immature Granulocytes 0 Not Estab. %   Immature Grans (Abs) 0.0 0.0 - 0.1 x10E3/uL  Alpha-1-Antitrypsin Phenotyp  Result Value Ref Range   A-1 Antitrypsin 206 (H) 101 - 187 mg/dL   A-1 Antitrypsin Pheno MM   Brain natriuretic peptide  Result Value Ref Range   BNP 99.2 0.0 - 100.0 pg/mL   CT Head Wo Contrast  Result Date: 11/19/2022 CLINICAL DATA:  Trauma EXAM: CT HEAD WITHOUT CONTRAST TECHNIQUE: Contiguous axial images were obtained from the base of the skull through the vertex without intravenous contrast. RADIATION DOSE REDUCTION: This exam was performed according to the departmental dose-optimization program which includes automated exposure control, adjustment of the mA and/or kV according to patient size and/or use of iterative reconstruction technique. COMPARISON:  None Available. FINDINGS: Brain: No evidence of acute infarction, hemorrhage, hydrocephalus, extra-axial collection or mass lesion/mass effect. Vascular: No hyperdense vessel or unexpected calcification. Skull: Normal. Negative for fracture or focal lesion. Sinuses/Orbits: No middle ear or mastoid effusion. Paranasal sinuses are clear. Orbits are unremarkable. Other: None. IMPRESSION: No CT evidence of intracranial injury. Electronically Signed   By: Lorenza Cambridge M.D.   On: 11/19/2022 14:34   DG Hip Unilat W or Wo Pelvis 2-3 Views Left  Result Date: 11/19/2022 CLINICAL DATA:  Pain after fall EXAM: DG HIP (WITH OR WITHOUT PELVIS) 3V LEFT COMPARISON:  Pelvis x-ray 10/28/2022 and hip from outside institution.  Films only. No report. Older exams as well. FINDINGS: No fracture or dislocation. Preserved joint spaces and bone mineralization. Sclerotic focus along the left femoral neck is stable consistent with a small bone island. Note is made of significant colonic stool in the visualized pelvis. IMPRESSION: No acute osseous abnormality. Large amount of colonic stool incidentally seen Electronically Signed   By: Karen Kays M.D.   On: 11/19/2022 14:23    Procedures Procedures    Medications Ordered in ED Medications  HYDROcodone-acetaminophen (NORCO/VICODIN) 5-325 MG per tablet 1 tablet (1 tablet Oral Given 11/19/22 1509)    ED Course/ Medical Decision Making/ A&P                             Medical Decision Making Patient presenting with a trip and fall earlier this morning, no LOC, no residual symptoms suggesting postconcussion syndrome, no focal weakness.  We did get CT imaging given her significant contusion on her forehead.  Reassuringly normal, hip imaging also negative for acute fracture.  She has been sent home with hydrocodone, caution regarding sedation given.  Discussed ice and heat therapy.  Plan follow-up with her primary provider if her symptoms are not improving over the next week to 10 days.  Amount and/or Complexity of Data Reviewed Radiology: ordered.    Details: CT head and left hip negative for acute injuries.  Risk Prescription drug management.           Final Clinical Impression(s) / ED Diagnoses Final diagnoses:  Fall, initial encounter  Injury of head, initial encounter  Contusion of left hip, initial encounter    Rx / DC Orders ED Discharge Orders          Ordered    HYDROcodone-acetaminophen (NORCO/VICODIN) 5-325 MG tablet  Every 6 hours PRN        11/19/22 1548              Burgess Amor, PA-C 11/21/22 1901  Lonell Grandchild, MD 11/28/22 [phone]

## 2022-12-03 ENCOUNTER — Ambulatory Visit: Payer: BC Managed Care – PPO | Admitting: Orthopedic Surgery

## 2022-12-03 ENCOUNTER — Other Ambulatory Visit (INDEPENDENT_AMBULATORY_CARE_PROVIDER_SITE_OTHER): Payer: BC Managed Care – PPO

## 2022-12-03 ENCOUNTER — Encounter: Payer: Self-pay | Admitting: Orthopedic Surgery

## 2022-12-03 VITALS — BP 105/69 | HR 76 | Ht 65.0 in | Wt 117.0 lb

## 2022-12-03 DIAGNOSIS — M4712 Other spondylosis with myelopathy, cervical region: Secondary | ICD-10-CM | POA: Diagnosis not present

## 2022-12-03 DIAGNOSIS — M542 Cervicalgia: Secondary | ICD-10-CM

## 2022-12-03 DIAGNOSIS — M545 Low back pain, unspecified: Secondary | ICD-10-CM

## 2022-12-03 MED ORDER — METHOCARBAMOL 500 MG PO TABS: 500.0000 mg | ORAL_TABLET | Freq: Three times a day (TID) | ORAL | 1 refills | Status: AC | PRN

## 2022-12-03 NOTE — Progress Notes (Signed)
Orthopedic Spine Surgery Office Note  Assessment: Patient is a 52 y.o. female with 2 issues:  1) burning upper thoracic back pain, difficulty with fine motor skills in the hands, and unsteadiness with gait - possible myelopathy 2) chronic low back pain with no radicular symptoms   Plan: -Explained that there is not a role for conservative treatment for myelopathy, so recommended MRI of the cervical spine to evaluate for myelopathy -For her lumbar spine, I did explain that there are multiple conservative treatments that she can try. I recommended core strengthening. A packet of exercises was provided to her today to work on at least 3-4 times per week. I told her to keep using the tylenol and she could try a muscle relaxer (robaxin was prescribed) -Patient has tried intramuscular steroid injection, tylenol, NSAIDs -Explained that she would to be completely nicotine free prior to any elective spine surgery -Patient should return to office in 6 weeks, x-rays at next visit: none   Patient expressed understanding of the plan and all questions were answered to the patient's satisfaction.   ___________________________________________________________________________   History:  Patient is a 52 y.o. female who presents today for cervical and lumbar spine. Patient has noticed over the last year a burning pain in the upper thoracic spine that is constant. It is felt on a daily basis. She also has pain that radiates into her left lateral shoulder. It does not radiate past the shoulder. No pain in the right upper extremity. There was no trauma or injury that preceded the onset of pain. She also has had worsening balance and trouble with fine motor skills in her hands.   In regards to her low back, she has had pain for several years now and it has been getting progressively worse. She feels it in her low back. It is worse with activity and improves with rest. There is no pain radiating into either lower  extremity. There was no trauma or injury that preceded the onset of this pain.    Weakness: denies Difficulty with fine motor skills (e.g., buttoning shirts, handwriting): yes, has been dropping objects  Symptoms of imbalance: yes, has felt her feet are lacking coordination and has almost fallen several times Paresthesias and numbness: feels numbness in her bilateral hands and has neuropathy with numbness/tingling in her bilateral feet. No other numbness/paresthesias Bowel or bladder incontinence: denies Saddle anesthesia: denies  Treatments tried: tylenol, NSAIDs, intramuscular steroid injection  Review of systems: Denies fevers and chills, night sweats, unexplained weight loss, history of cancer. Has had pain that wakes her at night  Past medical history: HLD HTN GERD Depression/anxiety COPD Neuropathy Chronic pain  Allergies: prednisone, dramamine  Past surgical history:  Endometrial ablation Polypectomy  Social history: Reports use of nicotine product (smoking, vaping, patches, smokeless) Alcohol use: denies Denies recreational drug use  Physical Exam:  General: no acute distress, appears stated age Neurologic: alert, answering questions appropriately, following commands Respiratory: unlabored breathing on room air, symmetric chest rise Psychiatric: appropriate affect, normal cadence to speech   MSK (spine):  -Strength exam      Left  Right Grip strength                5/5  5/5 Interosseus   5/5   5/5 Wrist extension  5/5  5/5 Wrist flexion   5/5  5/5 Elbow flexion   5/5  5/5 Deltoid    5/5  5/5  EHL    5/5  5/5 TA    5/5  5/5 GSC    5/5  5/5 Knee extension  5/5  5/5 Hip flexion   5/5  5/5  -Sensory exam    Sensation intact to light touch in L3-S1 nerve distributions of bilateral lower extremities  Sensation intact to light touch in C5-T1 nerve distributions of bilateral upper extremities  -Brachioradialis DTR: 2/4 on the left, 2/4 on the  right -Biceps DTR: 2/4 on the left, 2/4 on the right -Achilles DTR: 2/4 on the left, 2/4 on the right -Patellar tendon DTR: 2/4 on the left, 2/4 on the right  -Spurling: negative bilaterally -Hoffman sign: negative bilaterally -Clonus: no beats bilaterally -Interosseous wasting: none seen -Grip and release test: negative  -Romberg: negative  -Gait: negative  -Imbalance with tandem gait: yes  Left shoulder exam: no pain through range of motion, negative jobe, negative belly press, no weakness with external rotation with arm at side Right shoulder exam: no pain through range of motion  Tinel's at wrist: negative bilaterally Phalen's at wrist: negative bilaterally Durkan's: negative bilaterally  Tinel's at elbow: negative bilaterally  Imaging: XR of the cervical spine from 12/03/2022 was independently reviewed and interpreted, showing anterior osteophyte formation at C5/6. No other significant degenerative changes. No fracture or dislocation seen. No evidence of instability on flexion/extension views.   XR of the lumbar spine from 12/03/2022 was independently reviewed and interpreted, showing no significant degenerative changes. No fracture or dislocation seen. No evidence of instability on flexion/extension views.    Patient name: Sherry [name] Patient MRN: [id number] Date of visit: 12/03/22

## 2022-12-20 ENCOUNTER — Other Ambulatory Visit: Payer: Self-pay | Admitting: Cardiology

## 2022-12-23 ENCOUNTER — Telehealth: Payer: Self-pay | Admitting: Cardiovascular Disease

## 2022-12-23 MED ORDER — METOPROLOL TARTRATE 50 MG PO TABS: ORAL_TABLET | ORAL | 0 refills | Status: DC

## 2022-12-23 NOTE — Telephone Encounter (Signed)
*  STAT* If patient is at the pharmacy, call can be transferred to refill team.   1. Which medications need to be refilled? (please list name of each medication and dose if known)  metoprolol tartrate (LOPRESSOR) 50 MG tablet   2. Which pharmacy/location (including street and city if local pharmacy) is medication to be sent to? [id number] - [place], [place] - [address]   3. Do they need a 30 day or 90 day supply? 90 day  Patient has one pill left.  Patient has appt schedule for 02/21/23

## 2023-01-05 ENCOUNTER — Ambulatory Visit (HOSPITAL_COMMUNITY)
Admission: RE | Admit: 2023-01-05 | Discharge: 2023-01-05 | Disposition: A | Discharge status: Home / Self Care | Payer: BC Managed Care – PPO | Source: Ambulatory Visit | Attending: Orthopedic Surgery | Admitting: Orthopedic Surgery

## 2023-01-05 DIAGNOSIS — M4712 Other spondylosis with myelopathy, cervical region: Secondary | ICD-10-CM | POA: Insufficient documentation

## 2023-01-14 ENCOUNTER — Ambulatory Visit: Payer: BC Managed Care – PPO | Admitting: Orthopedic Surgery

## 2023-01-14 DIAGNOSIS — M5412 Radiculopathy, cervical region: Secondary | ICD-10-CM | POA: Diagnosis not present

## 2023-01-15 NOTE — Progress Notes (Signed)
Orthopedic Spine Surgery Office Note   Assessment: Patient is a 52 y.o. female with burning upper thoracic back pain and pain that radiates into her right trapezius, possible radiculopathy     Plan: -Patient has tried intramuscular steroid injection, tylenol, NSAIDs -Recommended diagnostic/therapeutic cervical ESI -Explained that some of the pain radiating into her shoulder may be from the foraminal stenosis, but it would be unlikely causing her thoracic burning pain -Explained that she would to be completely nicotine free prior to any elective spine surgery -Patient should return to office in 6 weeks, x-rays at next visit: none     Patient expressed understanding of the plan and all questions were answered to the patient's satisfaction.    ___________________________________________________________________________     History:   Patient is a 52 y.o. female who presents today for follow up on her cervical spine. She is still having burning pain in the interscapular pain. She also has pain that radiates into her right trapezius. Her pain is the same as the last time I saw her. Denies paresthesias and numbness.      Treatments tried: tylenol, NSAIDs, intramuscular steroid injection    Physical Exam:   General: no acute distress, appears stated age Neurologic: alert, answering questions appropriately, following commands Respiratory: unlabored breathing on room air, symmetric chest rise Psychiatric: appropriate affect, normal cadence to speech     MSK (spine):   -Strength exam                                                   Left                  Right Grip strength                5/5                  5/5 Interosseus                  5/5                  5/5 Wrist extension            5/5                  5/5 Wrist flexion                 5/5                  5/5 Elbow flexion                5/5                  5/5 Deltoid                          5/5                  5/5    EHL                              5/5                  5/5 TA  5/5                  5/5 GSC                             5/5                  5/5 Knee extension            5/5                  5/5 Hip flexion                    5/5                  5/5   -Sensory exam                           Sensation intact to light touch in L3-S1 nerve distributions of bilateral lower extremities             Sensation intact to light touch in C5-T1 nerve distributions of bilateral upper extremities   Bilateral shoulder exam: no pain through range of motion, negative jobe, negative belly press, no weakness with external rotation with arm at side   Imaging: XR of the cervical spine from 12/03/2022 was previously independently reviewed and interpreted, showing anterior osteophyte formation at C5/6. No other significant degenerative changes. No fracture or dislocation seen. No evidence of instability on flexion/extension views.    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 No other foraminal stenosis. No central stenosis. No T2 cord signal change.      Patient name: Sherry [name] Patient MRN: [id number] Date of visit: 01/14/2023

## 2023-01-24 ENCOUNTER — Ambulatory Visit: Payer: BC Managed Care – PPO | Admitting: Physician Assistant

## 2023-01-25 ENCOUNTER — Ambulatory Visit (HOSPITAL_COMMUNITY)
Admission: RE | Admit: 2023-01-25 | Discharge: 2023-01-25 | Disposition: A | Discharge status: Home / Self Care | Payer: BC Managed Care – PPO | Source: Ambulatory Visit | Attending: Internal Medicine | Admitting: Internal Medicine

## 2023-01-25 DIAGNOSIS — J449 Chronic obstructive pulmonary disease, unspecified: Secondary | ICD-10-CM | POA: Insufficient documentation

## 2023-01-25 LAB — PULMONARY FUNCTION TEST
DL/VA % pred: 59 %
DL/VA: 2.5 ml/min/mmHg/L
DLCO unc % pred: 52 %
DLCO unc: 11.39 ml/min/mmHg
FEF 25-75 Post: 0.77 L/sec
FEF 25-75 Pre: 0.9 L/sec
FEF2575-%Change-Post: -13 %
FEF2575-%Pred-Post: 28 %
FEF2575-%Pred-Pre: 32 %
FEV1-%Change-Post: -8 %
FEV1-%Pred-Post: 63 %
FEV1-%Pred-Pre: 69 %
FEV1-Post: 1.82 L
FEV1-Pre: 2 L
FEV1FVC-%Change-Post: -1 %
FEV1FVC-%Pred-Pre: 73 %
FEV6-%Change-Post: -3 %
FEV6-%Pred-Post: 86 %
FEV6-%Pred-Pre: 89 %
FEV6-Post: 3.08 L
FEV6-Pre: 3.19 L
FEV6FVC-%Change-Post: 3 %
FEV6FVC-%Pred-Post: 99 %
FEV6FVC-%Pred-Pre: 96 %
FVC-%Change-Post: -7 %
FVC-%Pred-Post: 86 %
FVC-%Pred-Pre: 93 %
FVC-Post: 3.18 L
FVC-Pre: 3.42 L
Post FEV1/FVC ratio: 57 %
Post FEV6/FVC ratio: 97 %
Pre FEV1/FVC ratio: 58 %
Pre FEV6/FVC Ratio: 93 %
RV % pred: 264 %
RV: 5.08 L
TLC % pred: 162 %
TLC: 8.6 L

## 2023-01-25 MED ORDER — ALBUTEROL SULFATE (2.5 MG/3ML) 0.083% IN NEBU
2.5000 mg | INHALATION_SOLUTION | Freq: Once | RESPIRATORY_TRACT | Status: AC
  Administered 2023-01-25: 2.5 mg via RESPIRATORY_TRACT

## 2023-02-01 ENCOUNTER — Ambulatory Visit: Payer: BC Managed Care – PPO | Admitting: Internal Medicine

## 2023-02-02 ENCOUNTER — Telehealth: Payer: Self-pay | Admitting: Physical Medicine and Rehabilitation

## 2023-02-02 NOTE — Telephone Encounter (Signed)
Patient called and wanted to change appt. She is sick.CB#[phone]

## 2023-02-02 NOTE — Telephone Encounter (Signed)
Spoke with patient and rescheduled injection for 02/16/23. Patient aware driver needed

## 2023-02-03 ENCOUNTER — Encounter: Payer: BC Managed Care – PPO | Admitting: Physical Medicine and Rehabilitation

## 2023-02-04 ENCOUNTER — Encounter (HOSPITAL_COMMUNITY): Payer: Self-pay | Admitting: Emergency Medicine

## 2023-02-04 ENCOUNTER — Other Ambulatory Visit: Payer: Self-pay

## 2023-02-04 ENCOUNTER — Emergency Department (HOSPITAL_COMMUNITY): Payer: BC Managed Care – PPO

## 2023-02-04 ENCOUNTER — Inpatient Hospital Stay (HOSPITAL_COMMUNITY)
Admission: EM | Admit: 2023-02-04 | Discharge: 2023-02-07 | DRG: 871 | Disposition: A | Discharge status: Home / Self Care | Payer: BC Managed Care – PPO | Attending: Internal Medicine | Admitting: Internal Medicine

## 2023-02-04 DIAGNOSIS — Z720 Tobacco use: Secondary | ICD-10-CM | POA: Diagnosis not present

## 2023-02-04 DIAGNOSIS — E7800 Pure hypercholesterolemia, unspecified: Secondary | ICD-10-CM | POA: Diagnosis present

## 2023-02-04 DIAGNOSIS — K219 Gastro-esophageal reflux disease without esophagitis: Secondary | ICD-10-CM | POA: Diagnosis present

## 2023-02-04 DIAGNOSIS — A419 Sepsis, unspecified organism: Secondary | ICD-10-CM | POA: Diagnosis present

## 2023-02-04 DIAGNOSIS — D509 Iron deficiency anemia, unspecified: Secondary | ICD-10-CM | POA: Diagnosis present

## 2023-02-04 DIAGNOSIS — F419 Anxiety disorder, unspecified: Secondary | ICD-10-CM | POA: Diagnosis present

## 2023-02-04 DIAGNOSIS — R6520 Severe sepsis without septic shock: Secondary | ICD-10-CM | POA: Diagnosis present

## 2023-02-04 DIAGNOSIS — F17210 Nicotine dependence, cigarettes, uncomplicated: Secondary | ICD-10-CM | POA: Diagnosis present

## 2023-02-04 DIAGNOSIS — Z86010 Personal history of colonic polyps: Secondary | ICD-10-CM | POA: Diagnosis not present

## 2023-02-04 DIAGNOSIS — E46 Unspecified protein-calorie malnutrition: Secondary | ICD-10-CM

## 2023-02-04 DIAGNOSIS — J441 Chronic obstructive pulmonary disease with (acute) exacerbation: Secondary | ICD-10-CM | POA: Diagnosis present

## 2023-02-04 DIAGNOSIS — E8809 Other disorders of plasma-protein metabolism, not elsewhere classified: Secondary | ICD-10-CM | POA: Diagnosis present

## 2023-02-04 DIAGNOSIS — Z7982 Long term (current) use of aspirin: Secondary | ICD-10-CM

## 2023-02-04 DIAGNOSIS — Z79899 Other long term (current) drug therapy: Secondary | ICD-10-CM

## 2023-02-04 DIAGNOSIS — J470 Bronchiectasis with acute lower respiratory infection: Secondary | ICD-10-CM | POA: Diagnosis present

## 2023-02-04 DIAGNOSIS — E871 Hypo-osmolality and hyponatremia: Secondary | ICD-10-CM | POA: Diagnosis present

## 2023-02-04 DIAGNOSIS — D75839 Thrombocytosis, unspecified: Secondary | ICD-10-CM | POA: Insufficient documentation

## 2023-02-04 DIAGNOSIS — Z800 Family history of malignant neoplasm of digestive organs: Secondary | ICD-10-CM

## 2023-02-04 DIAGNOSIS — E869 Volume depletion, unspecified: Secondary | ICD-10-CM | POA: Diagnosis present

## 2023-02-04 DIAGNOSIS — I10 Essential (primary) hypertension: Secondary | ICD-10-CM | POA: Diagnosis present

## 2023-02-04 DIAGNOSIS — J440 Chronic obstructive pulmonary disease with acute lower respiratory infection: Secondary | ICD-10-CM | POA: Diagnosis present

## 2023-02-04 DIAGNOSIS — G8929 Other chronic pain: Secondary | ICD-10-CM | POA: Diagnosis present

## 2023-02-04 DIAGNOSIS — Z1152 Encounter for screening for COVID-19: Secondary | ICD-10-CM | POA: Diagnosis not present

## 2023-02-04 DIAGNOSIS — F32A Depression, unspecified: Secondary | ICD-10-CM | POA: Diagnosis present

## 2023-02-04 DIAGNOSIS — Z888 Allergy status to other drugs, medicaments and biological substances status: Secondary | ICD-10-CM

## 2023-02-04 DIAGNOSIS — J189 Pneumonia, unspecified organism: Secondary | ICD-10-CM | POA: Diagnosis present

## 2023-02-04 DIAGNOSIS — J9601 Acute respiratory failure with hypoxia: Secondary | ICD-10-CM | POA: Diagnosis present

## 2023-02-04 LAB — PROTIME-INR
INR: 1.2 (ref 0.8–1.2)
Prothrombin Time: 14.9 seconds (ref 11.4–15.2)

## 2023-02-04 LAB — COMPREHENSIVE METABOLIC PANEL
ALT: 24 U/L (ref 0–44)
AST: 31 U/L (ref 15–41)
Albumin: 2.9 g/dL — ABNORMAL LOW (ref 3.5–5.0)
Alkaline Phosphatase: 92 U/L (ref 38–126)
Anion gap: 11 (ref 5–15)
BUN: 11 mg/dL (ref 6–20)
CO2: 25 mmol/L (ref 22–32)
Calcium: 8 mg/dL — ABNORMAL LOW (ref 8.9–10.3)
Chloride: 94 mmol/L — ABNORMAL LOW (ref 98–111)
Creatinine, Ser: 0.77 mg/dL (ref 0.44–1.00)
GFR, Estimated: >60 mL/min (ref >60)
Glucose, Bld: 125 mg/dL — ABNORMAL HIGH (ref 70–99)
Potassium: 4.2 mmol/L (ref 3.5–5.1)
Sodium: 130 mmol/L — ABNORMAL LOW (ref 135–145)
Total Bilirubin: 0.5 mg/dL (ref 0.3–1.2)
Total Protein: 6.8 g/dL (ref 6.5–8.1)

## 2023-02-04 LAB — URINALYSIS, W/ REFLEX TO CULTURE (INFECTION SUSPECTED)
Bacteria, UA: NONE SEEN
Bilirubin Urine: NEGATIVE
Glucose, UA: NEGATIVE mg/dL
Hgb urine dipstick: NEGATIVE
Ketones, ur: NEGATIVE mg/dL
Leukocytes,Ua: NEGATIVE
Nitrite: NEGATIVE
Protein, ur: NEGATIVE mg/dL
Specific Gravity, Urine: 1.01 (ref 1.005–1.030)
pH: 6 (ref 5.0–8.0)

## 2023-02-04 LAB — CBC WITH DIFFERENTIAL/PLATELET
Abs Immature Granulocytes: 0.1 10*3/uL — ABNORMAL HIGH (ref 0.00–0.07)
Basophils Absolute: 0 10*3/uL (ref 0.0–0.1)
Basophils Relative: 0 %
Eosinophils Absolute: 0 10*3/uL (ref 0.0–0.5)
Eosinophils Relative: 0 %
HCT: 33 % — ABNORMAL LOW (ref 36.0–46.0)
Hemoglobin: 10.9 g/dL — ABNORMAL LOW (ref 12.0–15.0)
Immature Granulocytes: 1 %
Lymphocytes Relative: 13 %
Lymphs Abs: 2.3 10*3/uL (ref 0.7–4.0)
MCH: 32 pg (ref 26.0–34.0)
MCHC: 33 g/dL (ref 30.0–36.0)
MCV: 96.8 fL (ref 80.0–100.0)
Monocytes Absolute: 1.4 10*3/uL — ABNORMAL HIGH (ref 0.1–1.0)
Monocytes Relative: 8 %
Neutro Abs: 14.5 10*3/uL — ABNORMAL HIGH (ref 1.7–7.7)
Neutrophils Relative %: 78 %
Platelets: 469 10*3/uL — ABNORMAL HIGH (ref 150–400)
RBC: 3.41 MIL/uL — ABNORMAL LOW (ref 3.87–5.11)
RDW: 13.9 % (ref 11.5–15.5)
WBC: 18.3 10*3/uL — ABNORMAL HIGH (ref 4.0–10.5)
nRBC: 0 % (ref 0.0–0.2)

## 2023-02-04 LAB — LACTIC ACID, PLASMA: Lactic Acid, Venous: 0.9 mmol/L (ref 0.5–1.9)

## 2023-02-04 LAB — SARS CORONAVIRUS 2 BY RT PCR: SARS Coronavirus 2 by RT PCR: NEGATIVE

## 2023-02-04 LAB — APTT: aPTT: 51 seconds — ABNORMAL HIGH (ref 24–36)

## 2023-02-04 LAB — CULTURE, BLOOD (ROUTINE X 2): Special Requests: ADEQUATE

## 2023-02-04 LAB — POC URINE PREG, ED: Preg Test, Ur: NEGATIVE

## 2023-02-04 MED ORDER — SODIUM CHLORIDE 0.9 % IV SOLN: INTRAVENOUS | Status: DC

## 2023-02-04 MED ORDER — METHYLPREDNISOLONE SODIUM SUCC 40 MG IJ SOLR
40.0000 mg | Freq: Two times a day (BID) | INTRAMUSCULAR | Status: DC
  Administered 2023-02-05 – 2023-02-07 (×5): 40 mg via INTRAVENOUS
  Filled 2023-02-04 (×5): qty 1

## 2023-02-04 MED ORDER — ENSURE ENLIVE PO LIQD
237.0000 mL | Freq: Two times a day (BID) | ORAL | Status: DC
  Administered 2023-02-05 – 2023-02-07 (×5): 237 mL via ORAL
  Filled 2023-02-04 (×2): qty 237

## 2023-02-04 MED ORDER — IPRATROPIUM-ALBUTEROL 0.5-2.5 (3) MG/3ML IN SOLN
3.0000 mL | Freq: Four times a day (QID) | RESPIRATORY_TRACT | Status: DC
  Administered 2023-02-05: 3 mL via RESPIRATORY_TRACT
  Filled 2023-02-04: qty 3

## 2023-02-04 MED ORDER — ONDANSETRON HCL 4 MG PO TABS: 4.0000 mg | ORAL_TABLET | Freq: Four times a day (QID) | ORAL | Status: DC | PRN

## 2023-02-04 MED ORDER — SODIUM CHLORIDE 0.9 % IV SOLN
500.0000 mg | INTRAVENOUS | Status: DC
  Administered 2023-02-04 – 2023-02-05 (×2): 500 mg via INTRAVENOUS
  Filled 2023-02-04 (×2): qty 5

## 2023-02-04 MED ORDER — ACETAMINOPHEN 650 MG RE SUPP: 650.0000 mg | Freq: Four times a day (QID) | RECTAL | Status: DC | PRN

## 2023-02-04 MED ORDER — ALBUTEROL SULFATE (2.5 MG/3ML) 0.083% IN NEBU: 10.0000 mg | INHALATION_SOLUTION | RESPIRATORY_TRACT | Status: AC

## 2023-02-04 MED ORDER — DM-GUAIFENESIN ER 30-600 MG PO TB12
1.0000 | ORAL_TABLET | Freq: Two times a day (BID) | ORAL | Status: DC
  Administered 2023-02-05 – 2023-02-07 (×6): 1 via ORAL
  Filled 2023-02-04 (×6): qty 1

## 2023-02-04 MED ORDER — METHYLPREDNISOLONE SODIUM SUCC 125 MG IJ SOLR
125.0000 mg | Freq: Once | INTRAMUSCULAR | Status: AC
  Administered 2023-02-04: 125 mg via INTRAVENOUS
  Filled 2023-02-04: qty 2

## 2023-02-04 MED ORDER — ENOXAPARIN SODIUM 40 MG/0.4ML IJ SOSY
40.0000 mg | PREFILLED_SYRINGE | INTRAMUSCULAR | Status: DC
  Administered 2023-02-05 – 2023-02-07 (×3): 40 mg via SUBCUTANEOUS
  Filled 2023-02-04 (×3): qty 0.4

## 2023-02-04 MED ORDER — ALBUTEROL SULFATE (2.5 MG/3ML) 0.083% IN NEBU
INHALATION_SOLUTION | RESPIRATORY_TRACT | Status: AC
  Administered 2023-02-04: 10 mg
  Filled 2023-02-04: qty 12

## 2023-02-04 MED ORDER — ACETAMINOPHEN 325 MG PO TABS
650.0000 mg | ORAL_TABLET | Freq: Four times a day (QID) | ORAL | Status: DC | PRN
  Administered 2023-02-05 – 2023-02-06 (×3): 650 mg via ORAL
  Filled 2023-02-04 (×3): qty 2

## 2023-02-04 MED ORDER — ONDANSETRON HCL 4 MG/2ML IJ SOLN: 4.0000 mg | Freq: Four times a day (QID) | INTRAMUSCULAR | Status: DC | PRN

## 2023-02-04 MED ORDER — ALBUTEROL SULFATE HFA 108 (90 BASE) MCG/ACT IN AERS
2.0000 | INHALATION_SPRAY | RESPIRATORY_TRACT | Status: DC | PRN
  Administered 2023-02-05: 2 via RESPIRATORY_TRACT
  Filled 2023-02-04: qty 6.7

## 2023-02-04 MED ORDER — PANTOPRAZOLE SODIUM 40 MG PO TBEC
40.0000 mg | DELAYED_RELEASE_TABLET | Freq: Every day | ORAL | Status: DC
  Administered 2023-02-05 – 2023-02-07 (×3): 40 mg via ORAL
  Filled 2023-02-04 (×3): qty 1

## 2023-02-04 MED ORDER — SODIUM CHLORIDE 0.9 % IV SOLN
2.0000 g | INTRAVENOUS | Status: DC
  Administered 2023-02-04 – 2023-02-06 (×3): 2 g via INTRAVENOUS
  Filled 2023-02-04 (×3): qty 20

## 2023-02-04 MED ORDER — ACETAMINOPHEN 325 MG PO TABS
650.0000 mg | ORAL_TABLET | Freq: Once | ORAL | Status: AC
  Administered 2023-02-04: 650 mg via ORAL
  Filled 2023-02-04: qty 2

## 2023-02-04 MED ORDER — GUAIFENESIN-DM 100-10 MG/5ML PO SYRP
5.0000 mL | ORAL_SOLUTION | ORAL | Status: DC | PRN
  Administered 2023-02-07: 5 mL via ORAL
  Filled 2023-02-04: qty 5

## 2023-02-04 MED ORDER — LACTATED RINGERS IV SOLN: INTRAVENOUS | Status: DC

## 2023-02-04 NOTE — ED Triage Notes (Signed)
Pt states she has had a cough with SOB x 4 days.

## 2023-02-04 NOTE — ED Notes (Signed)
POC Urine Preg= Negative  

## 2023-02-04 NOTE — ED Provider Notes (Signed)
[place] EMERGENCY DEPARTMENT AT [facility] Provider Note   CSN: [id number] Arrival date & time: 02/04/23  2046     History  Chief Complaint  Patient presents with   Cough   Shortness of Breath    Sherry [name] is a 52 y.o. female.   Cough Associated symptoms: shortness of breath   Shortness of Breath Associated symptoms: cough    This patient is a 52 year old female, she has a history of fairly severe COPD, she presents to the hospital today with a complaint of fairly severe shortness of breath which has been going on for about 4 days, getting worse and now associated with fevers and an ongoing cough making it hard for her to speak in full sentences.  She is on multiple different medications for her COPD including albuterol and tiotropium inhalers.  She has not been admitted to the hospital in the last 5 years for COPD exacerbation based on my review of the medical record.  She denies nausea vomiting or diarrhea.  Not on home oxygen.   Home Medications Prior to Admission medications   Medication Sig Start Date End Date Taking? Authorizing Provider  acetaminophen (TYLENOL) 500 MG tablet Take 1,000 mg by mouth every 6 (six) hours as needed (pain.).    [provider]  albuterol (VENTOLIN HFA) 108 (90 Base) MCG/ACT inhaler Inhale 1-2 puffs into the lungs every 4 (four) hours as needed for shortness of breath or wheezing. 11/02/22   Nyoka Cowden, MD  aspirin EC 81 MG tablet Take 81 mg by mouth daily. Swallow whole.    [provider]  cholecalciferol (VITAMIN D3) 25 MCG (1000 UNIT) tablet Take 1,000 Units by mouth daily.    [provider]  clonazePAM (KLONOPIN) 0.5 MG tablet Take 0.5 mg by mouth in the morning, at noon, and at bedtime.    [provider]  famotidine (PEPCID) 20 MG tablet One after supper 09/21/22   Nyoka Cowden, MD  FLUoxetine (PROZAC) 10 MG capsule Take 10 mg by mouth in the morning.    [provider]   hydrochlorothiazide (HYDRODIURIL) 25 MG tablet Take 25 mg by mouth every morning. 07/13/22   [provider]  HYDROcodone-acetaminophen (NORCO/VICODIN) 5-325 MG tablet Take 1 tablet by mouth every 6 (six) hours as needed. 11/19/22   Burgess Amor, PA-C  ibuprofen (ADVIL) 200 MG tablet Take 400 mg by mouth every 8 (eight) hours as needed (pain.).    [provider]  metoprolol tartrate (LOPRESSOR) 50 MG tablet Take 1/2 (one-half) tablet by mouth twice daily 12/23/22   Wendall Stade, MD  pantoprazole (PROTONIX) 40 MG tablet Take 40 mg by mouth daily before breakfast.    [provider]  potassium chloride SA (KLOR-CON) 20 MEQ tablet Take 1 tablet (20 mEq total) by mouth 2 (two) times daily for 3 days. Patient taking differently: Take 20 mEq by mouth in the morning. 09/09/20 03/10/24  Sabas Sous, MD  QUEtiapine (SEROQUEL) 400 MG tablet Take 1 tablet (400 mg total) by mouth at bedtime. Patient taking differently: Take 800 mg by mouth at bedtime. 06/06/18   Burgess Amor, PA-C  sodium chloride (OCEAN) 0.65 % SOLN nasal spray Place 1 spray into both nostrils as needed for congestion.    [provider]  Tiotropium Bromide-Olodaterol (STIOLTO RESPIMAT) 2.5-2.5 MCG/ACT AERS Inhale 2 puffs into the lungs daily. 09/21/22   Nyoka Cowden, MD  linaclotide [facility]) 72 MCG capsule Take 1 capsule (72 mcg  total) by mouth daily before breakfast. Patient not taking: No sig reported 07/27/19 12/06/20  Anice Paganini, NP  metoprolol succinate (TOPROL-XL) 50 MG 24 hr tablet Take 50 mg by mouth every morning. Take with or immediately following a meal.  12/06/20  [provider]  sertraline (ZOLOFT) 100 MG tablet Take 0.5 tablets (50 mg total) by mouth daily. Patient not taking: Reported on 12/03/2020 06/06/18 12/06/20  Burgess Amor, PA-C      Allergies    Dramamine [dimenhydrinate] and Prednisone    Review of Systems   Review of Systems  Respiratory:  Positive for cough and  shortness of breath.   All other systems reviewed and are negative.   Physical Exam Updated Vital Signs BP 114/78   Pulse (!) 104   Temp (!) 101.7 F (38.7 C) (Oral)   Resp 17   Ht 1.651 m (5\' 5" )   Wt 51.3 kg   SpO2 91%   BMI 18.80 kg/m  Physical Exam Vitals and nursing note reviewed.  Constitutional:      General: She is in acute distress.     Appearance: She is well-developed. She is ill-appearing.  HENT:     Head: Normocephalic and atraumatic.     Mouth/Throat:     Pharynx: No oropharyngeal exudate.  Eyes:     General: No scleral icterus.       Right eye: No discharge.        Left eye: No discharge.     Conjunctiva/sclera: Conjunctivae normal.     Pupils: Pupils are equal, round, and reactive to light.  Neck:     Thyroid: No thyromegaly.     Vascular: No JVD.  Cardiovascular:     Rate and Rhythm: Regular rhythm. Tachycardia present.     Heart sounds: Normal heart sounds. No murmur heard.    No friction rub. No gallop.  Pulmonary:     Effort: Respiratory distress present.     Breath sounds: Wheezing, rhonchi and rales present.  Abdominal:     General: Bowel sounds are normal. There is no distension.     Palpations: Abdomen is soft. There is no mass.     Tenderness: There is no abdominal tenderness.  Musculoskeletal:        General: No tenderness. Normal range of motion.     Cervical back: Normal range of motion and neck supple.     Right lower leg: No edema.     Left lower leg: No edema.  Lymphadenopathy:     Cervical: No cervical adenopathy.  Skin:    General: Skin is warm and dry.     Findings: No erythema or rash.  Neurological:     Mental Status: She is alert.     Coordination: Coordination normal.  Psychiatric:        Behavior: Behavior normal.     ED Results / Procedures / Treatments   Labs (all labs ordered are listed, but only abnormal results are displayed) Labs Reviewed  COMPREHENSIVE METABOLIC PANEL - Abnormal; Notable for the following  components:      Result Value   Sodium 130 (*)    Chloride 94 (*)    Glucose, Bld 125 (*)    Calcium 8.0 (*)    Albumin 2.9 (*)    All other components within normal limits  CBC WITH DIFFERENTIAL/PLATELET - Abnormal; Notable for the following components:   WBC 18.3 (*)    RBC 3.41 (*)    Hemoglobin 10.9 (*)  HCT 33.0 (*)    Platelets 469 (*)    Neutro Abs 14.5 (*)    Monocytes Absolute 1.4 (*)    Abs Immature Granulocytes 0.10 (*)    All other components within normal limits  APTT - Abnormal; Notable for the following components:   aPTT 51 (*)    All other components within normal limits  SARS CORONAVIRUS 2 BY RT PCR  CULTURE, BLOOD (ROUTINE X 2)  CULTURE, BLOOD (ROUTINE X 2)  LACTIC ACID, PLASMA  PROTIME-INR  URINALYSIS, W/ REFLEX TO CULTURE (INFECTION SUSPECTED)  LACTIC ACID, PLASMA  POC URINE PREG, ED    EKG EKG Interpretation Date/Time:  Friday February 04 2023 21:29:16 EDT Ventricular Rate:  99 PR Interval:  176 QRS Duration:  101 QT Interval:  351 QTC Calculation: 451 R Axis:   -89  Text Interpretation: Sinus rhythm LAD, consider left anterior fascicular block Borderline low voltage, extremity leads Abnormal R-wave progression, late transition Confirmed by Eber Hong ([id number]) on 02/04/2023 9:41:12 PM  Radiology DG Chest Port 1 View  Result Date: 02/04/2023 CLINICAL DATA:  Shortness of breath for several days, initial encounter EXAM: PORTABLE CHEST 1 VIEW COMPARISON:  05/25/2021, CT from 07/29/2022 FINDINGS: Cardiac shadow is within normal limits. Mild interstitial changes are noted bilaterally slightly increased when compared with the prior CT examination. This likely represents some acute superimposed edema. No focal confluent infiltrate is seen. No effusion is noted. No bony abnormality is seen. IMPRESSION: Slight increase in interstitial changes likely representing edema. Electronically Signed   By: Alcide Clever M.D.   On: 02/04/2023 21:57     Procedures .Critical Care  Performed by: Eber Hong, MD Authorized by: Eber Hong, MD   Critical care provider statement:    Critical care time (minutes):  45   Critical care time was exclusive of:  Separately billable procedures and treating other patients and teaching time   Critical care was necessary to treat or prevent imminent or life-threatening deterioration of the following conditions:  Sepsis and respiratory failure   Critical care was time spent personally by me on the following activities:  Development of treatment plan with patient or surrogate, discussions with consultants, evaluation of patient's response to treatment, examination of patient, obtaining history from patient or surrogate, review of old charts, re-evaluation of patient's condition, pulse oximetry, ordering and review of radiographic studies, ordering and review of laboratory studies and ordering and performing treatments and interventions   I assumed direction of critical care for this patient from another provider in my specialty: no     Care discussed with: admitting provider   Comments:           Medications Ordered in ED Medications  albuterol (VENTOLIN HFA) 108 (90 Base) MCG/ACT inhaler 2 puff (has no administration in time range)  lactated ringers infusion ( Intravenous New Bag/Given 02/04/23 2148)  cefTRIAXone (ROCEPHIN) 2 g in sodium chloride 0.9 % 100 mL IVPB (2 g Intravenous New Bag/Given 02/04/23 2150)  azithromycin (ZITHROMAX) 500 mg in sodium chloride 0.9 % 250 mL IVPB (500 mg Intravenous New Bag/Given 02/04/23 2225)  albuterol (PROVENTIL) (2.5 MG/3ML) 0.083% nebulizer solution 10 mg (has no administration in time range)  acetaminophen (TYLENOL) tablet 650 mg (has no administration in time range)  albuterol (PROVENTIL) (2.5 MG/3ML) 0.083% nebulizer solution (10 mg  Given 02/04/23 2109)  methylPREDNISolone sodium succinate (SOLU-MEDROL) 125 mg/2 mL injection 125 mg (125 mg Intravenous Given  02/04/23 2134)    ED Course/ Medical Decision Making/ A&P Clinical  Course as of 02/04/23 2234  Fri Feb 04, 2023  2158 I have personally viewed and interpreted the x-ray which appears to have some subtle infiltrates in the left lung, this is the same place right hearing rales [BM]  2158 Tylenol given for fever [BM]    Clinical Course User Index [BM] Eber Hong, MD                             Medical Decision Making Amount and/or Complexity of Data Reviewed Labs: ordered. Radiology: ordered.  Risk OTC drugs. Prescription drug management.    This patient presents to the ED for concern of shortness of breath and what appears to be a sepsis syndrome, this involves an extensive number of treatment options, and is a complaint that carries with it a high risk of complications and morbidity.  The differential diagnosis includes sepsis, pneumonia, pneumothorax, history of pneumothorax.  Could be as simple as COVID   Co morbidities that complicate the patient evaluation  COPD Gold, ongoing tobacco use   Additional history obtained:  Additional history obtained from medical record External records from outside source obtained and reviewed including pulmonary testing and office visits   Lab Tests:  I Ordered, and personally interpreted labs.  The pertinent results include: Leukocytosis of 18,000, metabolic panel with mild hyponatremia, lactic acid thankfully is 0.9, I see no endorgan dysfunction other than the respiratory failure which is likely more related to the COPD underlying her acute respiratory infection   Imaging Studies ordered:  I ordered imaging studies including chest x-ray I independently visualized and interpreted imaging which showed read as some early infiltrates possibly edema I agree with the radiologist interpretation   Cardiac Monitoring: / EKG:  The patient was maintained on a cardiac monitor.  I personally viewed and interpreted the cardiac monitored  which showed an underlying rhythm of: Sinus tachycardia, gradually improving   Consultations Obtained:  I requested consultation with the hospitalist, Dr. Thomes Dinning,  and discussed lab and imaging findings as well as pertinent plan - they recommend: Admission to the hospital   Problem List / ED Course / Critical interventions / Medication management  Needs high level of care for what appears to be acute respiratory failure with hypoxia secondary to pneumonia and underlying lung disease I ordered medication including albuterol continuous nebulizer treatment, Solu-Medrol, antibiotics including Rocephin and Zithromax to cover for pulmonary sepsis for infection Reevaluation of the patient after these medicines showed that the patient slightly improved but critically ill I have reviewed the patients home medicines and have made adjustments as needed   Social Determinants of Health:  Chronic tobacco use   Test / Admission - Considered:  Admit to higher level of care, needs ongoing oxygen supplementation due to hypoxia         Final Clinical Impression(s) / ED Diagnoses Final diagnoses:  Sepsis, due to unspecified organism, unspecified whether acute organ dysfunction present [facility])  Acute respiratory failure with hypoxia [facility])    Rx / DC Orders ED Discharge Orders     None         Eber Hong, MD 02/04/23 2234

## 2023-02-04 NOTE — H&P (Signed)
History and Physical    Patient: Sherry [name] WJX:[id number] DOB: 03/12/1971 DOA: 02/04/2023 DOS: the patient was seen and examined on 02/04/2023 PCP: Alvina Filbert, MD  Patient coming from: Home  Chief Complaint:  Chief Complaint  Patient presents with   Cough   Shortness of Breath   HPI: Sherry [name] is a 52 y.o. female with medical history significant of COPD (not on home oxygen), GERD, hypertension, tobacco abuse who presents to the emergency department due to 4-day onset of shortness of breath and nonproductive cough that has been worsening.  She went to an urgent care this afternoon and was noted to have fever, so she was asked to go to the ED for further evaluation and management.  Patient continues to smoke cigarettes with last smoking episode being this afternoon.  She denies chest pain, nausea, vomiting, abdominal pain.  ED Course:  In the emergency department, of 1 0.1F, pulse 104 bpm, respiratory rate 17/min, BP 114/78, O2 sat 84% on room air, this improved to 91% on supplemental oxygen via [place] at 2 LPM.  Workup in the ED showed WBC 18.3 with a left shift, hemoglobin 10.9, hematocrit 33.0, MCV 96.8, platelets 469.  Sodium 130, potassium 4.2, chloride 4, bicarb 25, blood glucose 125, BUN 11, creatinine 0.00, albumin 2.9, lactic acid 0.9, pregnancy test was negative, urinalysis was normal.  SARS coronavirus 2 was negative.  Blood culture pending. Chest x-ray showed slight increase in intestinal changes likely representing edema with Tylenol, ceftriaxone, azithromycin, breathing treatment was provided, IV Solu-Medrol 125 mg x 1 was given. Hospitalist was asked to admit patient for further evaluation and management.   Review of Systems: Review of systems as noted in the HPI. All other systems reviewed and are negative.   Past Medical History:  Diagnosis Date   COPD (chronic obstructive pulmonary disease) (HCC)    Depression    High cholesterol    Hypertension    Lung collapse     left   Past Surgical History:  Procedure Laterality Date   CHEST TUBE INSERTION     COLONOSCOPY WITH PROPOFOL N/A 03/17/2022   Procedure: COLONOSCOPY WITH PROPOFOL;  Surgeon: Dolores Frame, MD;  [facility];  Service: Gastroenterology;  Laterality: N/A;  930 ASA 2   ENDOMETRIAL ABLATION     POLYPECTOMY  03/17/2022   Procedure: POLYPECTOMY;  Surgeon: Dolores Frame, MD;  Location: [facility];  Service: Gastroenterology;;    Social History:  reports that she has been smoking cigarettes. She has a 26.3 pack-year smoking history. She has been exposed to tobacco smoke. She has never used smokeless tobacco. She reports current alcohol use. She reports that she does not use drugs.   Allergies  Allergen Reactions   Dramamine [Dimenhydrinate] Swelling and Other (See Comments)    Made her feel really drunk. Swelling of tongue     Prednisone Other (See Comments)    Feels like fingers are swelling    Family History  Problem Relation Age of Onset   Colon polyps Mother    Colon cancer Maternal Grandmother    Colon cancer Maternal Grandfather    Colon cancer Paternal Grandmother    Colon cancer Paternal Grandfather    Colon cancer Paternal Uncle      Prior to Admission medications   Medication Sig Start Date End Date Taking? Authorizing Provider  acetaminophen (TYLENOL) 500 MG tablet Take 1,000 mg by mouth every 6 (six) hours as needed (pain.).    [provider]  albuterol (VENTOLIN HFA) 108 (90 Base) MCG/ACT inhaler Inhale 1-2 puffs into the lungs every 4 (four) hours as needed for shortness of breath or wheezing. 11/02/22   Nyoka Cowden, MD  aspirin EC 81 MG tablet Take 81 mg by mouth daily. Swallow whole.    [provider]  cholecalciferol (VITAMIN D3) 25 MCG (1000 UNIT) tablet Take 1,000 Units by mouth daily.    [provider]  clonazePAM (KLONOPIN) 0.5 MG tablet Take 0.5 mg by mouth in the morning, at noon, and at  bedtime.    [provider]  famotidine (PEPCID) 20 MG tablet One after supper 09/21/22   Nyoka Cowden, MD  FLUoxetine (PROZAC) 10 MG capsule Take 10 mg by mouth in the morning.    [provider]  hydrochlorothiazide (HYDRODIURIL) 25 MG tablet Take 25 mg by mouth every morning. 07/13/22   [provider]  HYDROcodone-acetaminophen (NORCO/VICODIN) 5-325 MG tablet Take 1 tablet by mouth every 6 (six) hours as needed. 11/19/22   Burgess Amor, PA-C  ibuprofen (ADVIL) 200 MG tablet Take 400 mg by mouth every 8 (eight) hours as needed (pain.).    [provider]  metoprolol tartrate (LOPRESSOR) 50 MG tablet Take 1/2 (one-half) tablet by mouth twice daily 12/23/22   Wendall Stade, MD  pantoprazole (PROTONIX) 40 MG tablet Take 40 mg by mouth daily before breakfast.    [provider]  potassium chloride SA (KLOR-CON) 20 MEQ tablet Take 1 tablet (20 mEq total) by mouth 2 (two) times daily for 3 days. Patient taking differently: Take 20 mEq by mouth in the morning. 09/09/20 03/10/24  Sabas Sous, MD  QUEtiapine (SEROQUEL) 400 MG tablet Take 1 tablet (400 mg total) by mouth at bedtime. Patient taking differently: Take 800 mg by mouth at bedtime. 06/06/18   Burgess Amor, PA-C  sodium chloride (OCEAN) 0.65 % SOLN nasal spray Place 1 spray into both nostrils as needed for congestion.    [provider]  Tiotropium Bromide-Olodaterol (STIOLTO RESPIMAT) 2.5-2.5 MCG/ACT AERS Inhale 2 puffs into the lungs daily. 09/21/22   Nyoka Cowden, MD  linaclotide Karlene Einstein) 72 MCG capsule Take 1 capsule (72 mcg total) by mouth daily before breakfast. Patient not taking: No sig reported 07/27/19 12/06/20  Anice Paganini, NP  metoprolol succinate (TOPROL-XL) 50 MG 24 hr tablet Take 50 mg by mouth every morning. Take with or immediately following a meal.  12/06/20  [provider]  sertraline (ZOLOFT) 100 MG tablet Take 0.5 tablets (50 mg total) by mouth daily. Patient  not taking: Reported on 12/03/2020 06/06/18 12/06/20  Burgess Amor, PA-C    Physical Exam: BP (!) 99/51   Pulse (!) 105   Temp 99.6 F (37.6 C)   Resp 19   Ht 5\' 5"  (1.651 m)   Wt 51.3 kg   SpO2 94%   BMI 18.80 kg/m   General: 52 y.o. year-old female ill appearing, but in no acute distress.  Alert and oriented x3. HEENT: NCAT, EOMI Neck: Supple, trachea medial Cardiovascular: Tachycardia.  Regular rate and rhythm with no rubs or gallops.  No thyromegaly or JVD noted.  No lower extremity edema. 2/4 pulses in all 4 extremities. Respiratory: Scattered expiratory wheezes and diffuse Rales on auscultation. Abdomen: Soft, nontender nondistended with normal bowel sounds x4 quadrants. Muskuloskeletal: No cyanosis, clubbing or edema noted bilaterally Neuro: CN II-XII intact, strength 5/5 x 4, sensation, reflexes intact Skin: No ulcerative lesions noted or rashes Psychiatry: Judgement and insight  appear normal. Mood is appropriate for condition and setting          Labs on Admission:  Basic Metabolic Panel: Recent Labs  Lab 02/04/23 2137  NA 130*  K 4.2  CL 94*  CO2 25  GLUCOSE 125*  BUN 11  CREATININE 0.77  CALCIUM 8.0*   Liver Function Tests: Recent Labs  Lab 02/04/23 2137  AST 31  ALT 24  ALKPHOS 92  BILITOT 0.5  PROT 6.8  ALBUMIN 2.9*   No results for input(s): "LIPASE", "AMYLASE" in the last 168 hours. No results for input(s): "AMMONIA" in the last 168 hours. CBC: Recent Labs  Lab 02/04/23 2137  WBC 18.3*  NEUTROABS 14.5*  HGB 10.9*  HCT 33.0*  MCV 96.8  PLT 469*   Cardiac Enzymes: No results for input(s): "CKTOTAL", "CKMB", "CKMBINDEX", "TROPONINI" in the last 168 hours.  BNP (last 3 results) Recent Labs    09/21/22 1539  BNP 99.2    ProBNP (last 3 results) No results for input(s): "PROBNP" in the last 8760 hours.  CBG: No results for input(s): "GLUCAP" in the last 168 hours.  Radiological Exams on Admission: DG Chest Port 1 View  Result  Date: 02/04/2023 CLINICAL DATA:  Shortness of breath for several days, initial encounter EXAM: PORTABLE CHEST 1 VIEW COMPARISON:  05/25/2021, CT from 07/29/2022 FINDINGS: Cardiac shadow is within normal limits. Mild interstitial changes are noted bilaterally slightly increased when compared with the prior CT examination. This likely represents some acute superimposed edema. No focal confluent infiltrate is seen. No effusion is noted. No bony abnormality is seen. IMPRESSION: Slight increase in interstitial changes likely representing edema. Electronically Signed   By: Alcide Clever M.D.   On: 02/04/2023 21:57    EKG: I independently viewed the EKG done and my findings are as followed: Sinus tachycardia at a rate of 105 bpm  Assessment/Plan Present on Admission:  Sepsis due to pneumonia (HCC)  Acute exacerbation of chronic obstructive pulmonary disease (COPD) (HCC)  GERD (gastroesophageal reflux disease)  Cigarette smoker  Principal Problem:   Sepsis due to pneumonia [facility]) Active Problems:   GERD (gastroesophageal reflux disease)   Acute exacerbation of chronic obstructive pulmonary disease (COPD) (HCC)   Cigarette smoker   Acute respiratory failure with hypoxia (HCC)   Hypoalbuminemia due to protein-calorie malnutrition (HCC)   Thrombocytosis   Hyponatremia   Essential hypertension  Sepsis possibly secondary to CAP POA Patient met sepsis criteria due to being tachycardic, febrile and having leukocytosis (met SIRS criteria) with source of infection being the lung due to suspicion for pneumonia Patient was started on ceftriaxone and azithromycin, we shall continue same at this time with plan to de-escalate/discontinue based on blood culture, sputum culture, urine Legionella, strep pneumo and procalcitonin Continue Tylenol as needed Continue Mucinex, incentive spirometry, flutter valve   Acute exacerbation of COPD Continue duo nebs, Mucinex, Robitussin, Solu-Medrol, azithromycin. Continue  Protonix to prevent steroid-induced ulcer Continue incentive spirometry and flutter valve  Acute respiratory failure with hypoxia Continue supplemental oxygen to maintain O2 sat > 92% with plan to wean patient off oxygen as tolerated patient does not use oxygen at baseline  Hypoalbuminemia possibly secondary to moderate protein calorie malnutrition Albumin 2.9, protein supplement will be provided  Hyponatremia Sodium 138, gentle hydration will be provided Continue to monitor sodium levels  Thrombocytosis possibly reactive Platelets 469, Continue to monitor platelet levels  Essential hypertension BP meds will be held at this time due to soft BP  GERD  Continue  Protonix  Tobacco abuse Patient was counseled on tobacco abuse cessation  DVT prophylaxis: Lovenox  Advance Care Planning: Full code  Consults: None  Family Communication: Fiance at bedside (all questions answered to satisfaction)  Severity of Illness: The appropriate patient status for this patient is INPATIENT. Inpatient status is judged to be reasonable and necessary in order to provide the required intensity of service to ensure the patient's safety. The patient's presenting symptoms, physical exam findings, and initial radiographic and laboratory data in the context of their chronic comorbidities is felt to place them at high risk for further clinical deterioration. Furthermore, it is not anticipated that the patient will be medically stable for discharge from the hospital within 2 midnights of admission.   * I certify that at the point of admission it is my clinical judgment that the patient will require inpatient hospital care spanning beyond 2 midnights from the point of admission due to high intensity of service, high risk for further deterioration and high frequency of surveillance required.*  Author: Frankey Shown, DO 02/04/2023 11:42 PM  For on call review [email or web address].

## 2023-02-04 NOTE — Progress Notes (Signed)
Elink monitoring for the code sepsis protocol.  

## 2023-02-05 ENCOUNTER — Inpatient Hospital Stay (HOSPITAL_COMMUNITY): Payer: BC Managed Care – PPO

## 2023-02-05 DIAGNOSIS — A419 Sepsis, unspecified organism: Secondary | ICD-10-CM | POA: Diagnosis not present

## 2023-02-05 DIAGNOSIS — J9601 Acute respiratory failure with hypoxia: Secondary | ICD-10-CM | POA: Diagnosis not present

## 2023-02-05 DIAGNOSIS — D75839 Thrombocytosis, unspecified: Secondary | ICD-10-CM | POA: Diagnosis not present

## 2023-02-05 DIAGNOSIS — F17210 Nicotine dependence, cigarettes, uncomplicated: Secondary | ICD-10-CM

## 2023-02-05 DIAGNOSIS — J189 Pneumonia, unspecified organism: Secondary | ICD-10-CM | POA: Diagnosis not present

## 2023-02-05 LAB — RESPIRATORY PANEL BY PCR

## 2023-02-05 LAB — IRON AND TIBC
Iron: 10 ug/dL — ABNORMAL LOW (ref 28–170)
Saturation Ratios: 6 % — ABNORMAL LOW (ref 10.4–31.8)
TIBC: 175 ug/dL — ABNORMAL LOW (ref 250–450)
UIBC: 165 ug/dL

## 2023-02-05 LAB — VITAMIN B12: Vitamin B-12: 511 pg/mL (ref 180–914)

## 2023-02-05 LAB — FERRITIN: Ferritin: 89 ng/mL (ref 11–307)

## 2023-02-05 LAB — FOLATE: Folate: 9.6 ng/mL (ref >5.9)

## 2023-02-05 LAB — BRAIN NATRIURETIC PEPTIDE: B Natriuretic Peptide: 373 pg/mL — ABNORMAL HIGH (ref 0.0–100.0)

## 2023-02-05 MED ORDER — BUDESONIDE 0.5 MG/2ML IN SUSP
0.5000 mg | Freq: Two times a day (BID) | RESPIRATORY_TRACT | Status: DC
  Administered 2023-02-05 – 2023-02-07 (×5): 0.5 mg via RESPIRATORY_TRACT
  Filled 2023-02-05 (×5): qty 2

## 2023-02-05 MED ORDER — NICOTINE 21 MG/24HR TD PT24
21.0000 mg | MEDICATED_PATCH | Freq: Every day | TRANSDERMAL | Status: DC
  Administered 2023-02-05 – 2023-02-07 (×3): 21 mg via TRANSDERMAL
  Filled 2023-02-05 (×3): qty 1

## 2023-02-05 MED ORDER — CLONAZEPAM 0.5 MG PO TABS
0.5000 mg | ORAL_TABLET | Freq: Three times a day (TID) | ORAL | Status: DC
  Administered 2023-02-05 – 2023-02-07 (×7): 0.5 mg via ORAL
  Filled 2023-02-05 (×7): qty 1

## 2023-02-05 MED ORDER — TRAMADOL HCL 50 MG PO TABS
50.0000 mg | ORAL_TABLET | Freq: Four times a day (QID) | ORAL | Status: DC | PRN
  Administered 2023-02-05 – 2023-02-07 (×6): 50 mg via ORAL
  Filled 2023-02-05 (×6): qty 1

## 2023-02-05 MED ORDER — ARFORMOTEROL TARTRATE 15 MCG/2ML IN NEBU
15.0000 ug | INHALATION_SOLUTION | Freq: Two times a day (BID) | RESPIRATORY_TRACT | Status: DC
  Administered 2023-02-05 – 2023-02-07 (×5): 15 ug via RESPIRATORY_TRACT
  Filled 2023-02-05 (×5): qty 2

## 2023-02-05 MED ORDER — IPRATROPIUM-ALBUTEROL 0.5-2.5 (3) MG/3ML IN SOLN
3.0000 mL | Freq: Three times a day (TID) | RESPIRATORY_TRACT | Status: AC
  Administered 2023-02-05 – 2023-02-06 (×7): 3 mL via RESPIRATORY_TRACT
  Filled 2023-02-05 (×7): qty 3

## 2023-02-05 MED ORDER — SODIUM CHLORIDE 0.9 % IV SOLN
250.0000 mg | Freq: Once | INTRAVENOUS | Status: AC
  Administered 2023-02-05: 250 mg via INTRAVENOUS
  Filled 2023-02-05: qty 20

## 2023-02-05 MED ORDER — LACTATED RINGERS IV SOLN: INTRAVENOUS | Status: AC

## 2023-02-05 NOTE — Progress Notes (Signed)
   02/05/23 1437  TOC Brief Assessment  Insurance and Status Reviewed  Patient has primary care physician Yes  Home environment has been reviewed Home - Spouse  Prior level of function: independent  Prior/Current Home Services No current home services  Social Determinants of Health Reivew SDOH reviewed no interventions necessary  Readmission risk has been reviewed Yes  Transition of care needs no transition of care needs at this time   TOC following.

## 2023-02-05 NOTE — Plan of Care (Signed)

## 2023-02-05 NOTE — Progress Notes (Signed)
Pt states tramadol and heat packs relieved pain in back and neck. Pt continues with congested cough, currently unable to expectorate any phlegm. Continues with decreased breath sounds bilaterally and inspiratory/expiratory wheezes. Remains on O2 @ 3lpm [place] due to SaO2 < 92% on 2 lpm. Has been up walking in room without difficulty or assistance and tolerates well. VSS.

## 2023-02-05 NOTE — Progress Notes (Signed)
Pt advised of need to recollect sputum specimen per lab. Pt stated understanding. Pt currently using flutter valve and incentive spirometry without reminder from staff, tolerating well. New sputum collection cup given to patient with instructions for proper collection (coughed up phlegm, not saliva from mouth). Pt states understanding. Pt c/o low back pain, not relieved by oral Tylenol. MD notified, new orders received. Tramadol administered and heat packs applied to lower back for pain relief.

## 2023-02-05 NOTE — Progress Notes (Signed)
PROGRESS NOTE  Sherry [name] UJW:[id number] DOB: [birth year] DOA: 02/04/2023 PCP: Alvina Filbert, MD  Brief History:  52 year old female with a history of COPD, hypertension, tobacco abuse, anxiety/depression presenting with worsening shortness of breath and coughing that began on 01/31/2023.  The patient has had some subjective fevers and chills.  Her cough is largely nonproductive.  She denies any hemoptysis.  She denies any headache, vomiting, diarrhea, abdominal pain.  She has had some nausea.  She does have some chest discomfort with coughing.  She denies any rashes.  Notably, she takes about 10-15 ibuprofens per week for chronic back pain.  She denies any dysuria or hematuria. In the ED, the patient was febrile up to 101.7 F.  She was hemodynamically stable.  Oxygen saturation was 84% on room air.  She was placed on 3 L with saturation up to 100%. WBC 18.3, hemoglobin 10.9, platelets 169,000.  Sodium 130, potassium 4.2, bicarbonate 25, serum creatinine 0.77.  Chest x-ray showed increased interstitial markings.  LFTs were unremarkable.  COVID-19 PCR was negative.  Procalcitonin 0.24.  EKG showed sinus rhythm nonspecific ST-T wave changes.   Assessment/Plan: Acute respiratory failure with hypoxia -Secondary to COPD exacerbation and possible pneumonia -Presented with tachypnea and oxygen saturation 84% on room air -Stable on 3 L -Wean oxygen for saturation greater 92%  COPD exacerbation -Add Brovana and Pulmicort -Continue DuoNebs -Continue IV Solu-Medrol -Viral respiratory panel -CT chest  SIRS -Presented with leukocytosis and fever -Likely secondary to underlying pneumonia -CT chest -UA negative for pyuria -Follow blood cultures -Continue empiric ceftriaxone and azithromycin -IV fluids  Essential hypertension -Holding metoprolol secondary to soft blood pressure -Hold hydrochlorothiazide  Depression/anxiety -Continue fluoxetine and clonazepam -PDMP  reviewed -Clonazepam 0.5 mg, #90, last refill 01/19/2023  Tobacco abuse -Tobacco cessation discussed  Hyponatremia -Secondary to volume depletion -Continue IV fluids>> improving  Iron deficiency anemia -Iron saturation 6%, ferritin 89 -give dose nulecit       Family Communication:  no Family at bedside  Consultants:  none  Code Status:  FULL   DVT Prophylaxis:  [place] Lovenox   Procedures: As Listed in Progress Note Above  Antibiotics: Ceftriaxone 7/26>> Azithro 7/26>>      Subjective: Patient continues to have some shortness of breath but it is improving.  She denies any chest pain, nausea, vomiting or direct abdominal pain.  She denies any fevers or chills.  Objective: Vitals:   02/05/23 0506 02/05/23 0739 02/05/23 0745 02/05/23 0751  BP: 93/60     Pulse: 86     Resp: 20     Temp: 98.9 F (37.2 C)     TempSrc:      SpO2: 100% 99% 99% 99%  Weight:      Height:        Intake/Output Summary (Last 24 hours) at 02/05/2023 0800 Last data filed at 02/05/2023 5621 Gross per 24 hour  Intake 904.51 ml  Output --  Net 904.51 ml   Weight change:  Exam:  General:  Pt is alert, follows commands appropriately, not in acute distress HEENT: No icterus, No thrush, No neck mass, [place]/AT Cardiovascular: RRR, S1/S2, no rubs, no gallops Respiratory: Bilateral rales.  Bilateral expiratory wheeze. Abdomen: Soft/+BS, non tender, non distended, no guarding Extremities: No edema, No lymphangitis, No petechiae, No rashes, no synovitis   Data Reviewed: I have personally reviewed following labs and imaging studies Basic Metabolic Panel: Recent Labs  Lab 02/04/23 2137 02/05/23 0457  NA  130* 136  K 4.2 3.6  CL 94* 101  CO2 25 28  GLUCOSE 125* 213*  BUN 11 9  CREATININE 0.77 0.61  CALCIUM 8.0* 8.2*  MG  --  1.8  PHOS  --  2.9   Liver Function Tests: Recent Labs  Lab 02/04/23 2137 02/05/23 0457  AST 31 19  ALT 24 22  ALKPHOS 92 85  BILITOT 0.5 0.5  PROT 6.8  5.9*  ALBUMIN 2.9* 2.5*   No results for input(s): "LIPASE", "AMYLASE" in the last 168 hours. No results for input(s): "AMMONIA" in the last 168 hours. Coagulation Profile: Recent Labs  Lab 02/04/23 2137  INR 1.2   CBC: Recent Labs  Lab 02/04/23 2137 02/05/23 0457  WBC 18.3* 16.6*  NEUTROABS 14.5*  --   HGB 10.9* 10.4*  HCT 33.0* 32.7*  MCV 96.8 99.1  PLT 469* 431*   Cardiac Enzymes: No results for input(s): "CKTOTAL", "CKMB", "CKMBINDEX", "TROPONINI" in the last 168 hours. BNP: Invalid input(s): "POCBNP" CBG: No results for input(s): "GLUCAP" in the last 168 hours. HbA1C: No results for input(s): "HGBA1C" in the last 72 hours. Urine analysis:    Component Value Date/Time   COLORURINE YELLOW 02/04/2023 2108   APPEARANCEUR CLEAR 02/04/2023 2108   LABSPEC 1.010 02/04/2023 2108   PHURINE 6.0 02/04/2023 2108   GLUCOSEU NEGATIVE 02/04/2023 2108   HGBUR NEGATIVE 02/04/2023 2108   BILIRUBINUR NEGATIVE 02/04/2023 2108   KETONESUR NEGATIVE 02/04/2023 2108   PROTEINUR NEGATIVE 02/04/2023 2108   NITRITE NEGATIVE 02/04/2023 2108   LEUKOCYTESUR NEGATIVE 02/04/2023 2108   Sepsis Labs: @LABRCNTIP (procalcitonin:4,lacticidven:4) ) Recent Results (from the past 240 hour(s))  Blood Culture (routine x 2)     Status: None (Preliminary result)   Collection Time: 02/04/23  9:37 PM   Specimen: Left Antecubital; Blood  Result Value Ref Range Status   Specimen Description LEFT ANTECUBITAL  Final   Special Requests   Final    BOTTLES DRAWN AEROBIC AND ANAEROBIC Blood Culture results may not be optimal due to an excessive volume of blood received in culture bottles   Culture   Final    NO GROWTH < 12 HOURS Performed at [facility], [address], [place], [place] [address]    Report Status PENDING  Incomplete  Blood Culture (routine x 2)     Status: None (Preliminary result)   Collection Time: 02/04/23  9:38 PM   Specimen: BLOOD RIGHT FOREARM  Result Value Ref Range Status    Specimen Description BLOOD RIGHT FOREARM  Final   Special Requests   Final    BOTTLES DRAWN AEROBIC AND ANAEROBIC Blood Culture adequate volume   Culture   Final    NO GROWTH < 12 HOURS Performed at [facility], [address], [place], [place] [address]    Report Status PENDING  Incomplete  SARS Coronavirus 2 by RT PCR (hospital order, performed in [facility] hospital lab) *cepheid single result test* Anterior Nasal Swab     Status: None   Collection Time: 02/04/23  9:42 PM   Specimen: Anterior Nasal Swab  Result Value Ref Range Status   SARS Coronavirus 2 by RT PCR NEGATIVE NEGATIVE Final    Comment: (NOTE) SARS-CoV-2 target nucleic acids are NOT DETECTED.  The SARS-CoV-2 RNA is generally detectable in upper and lower respiratory specimens during the acute phase of infection. The lowest concentration of SARS-CoV-2 viral copies this assay can detect is 250 copies / mL. A negative result does not preclude SARS-CoV-2 infection and  should not be used as the sole basis for treatment or other patient management decisions.  A negative result may occur with improper specimen collection / handling, submission of specimen other than nasopharyngeal swab, presence of viral mutation(s) within the areas targeted by this assay, and inadequate number of viral copies (<250 copies / mL). A negative result must be combined with clinical observations, patient history, and epidemiological information.  Fact Sheet for Patients:   RoadLapTop.co.za  Fact Sheet for Healthcare Providers: http://kim-miller.com/  This test is not yet approved or  cleared by the [place] FDA and has been authorized for detection and/or diagnosis of SARS-CoV-2 by FDA under an Emergency Use Authorization (EUA).  This EUA will remain in effect (meaning this test can be used) for the duration of the COVID-19 declaration under Section 564(b)(1) of the Act, 21 U.S.C. section  360bbb-3(b)(1), unless the authorization is terminated or revoked sooner.  Performed at [facility], [address], [place], [place] [address]      Scheduled Meds:  arformoterol  15 mcg Nebulization BID   budesonide (PULMICORT) nebulizer solution  0.5 mg Nebulization BID   clonazePAM  0.5 mg Oral TID   dextromethorphan-guaiFENesin  1 tablet Oral BID   enoxaparin (LOVENOX) injection  40 mg Subcutaneous Q24H   feeding supplement  237 mL Oral BID BM   ipratropium-albuterol  3 mL Nebulization TID   methylPREDNISolone (SOLU-MEDROL) injection  40 mg Intravenous Q12H   pantoprazole  40 mg Oral Daily   Continuous Infusions:  azithromycin Stopped (02/04/23 2230)   cefTRIAXone (ROCEPHIN)  IV Stopped (02/04/23 2234)   lactated ringers      Procedures/Studies: DG Chest Port 1 View  Result Date: 02/04/2023 CLINICAL DATA:  Shortness of breath for several days, initial encounter EXAM: PORTABLE CHEST 1 VIEW COMPARISON:  05/25/2021, CT from 07/29/2022 FINDINGS: Cardiac shadow is within normal limits. Mild interstitial changes are noted bilaterally slightly increased when compared with the prior CT examination. This likely represents some acute superimposed edema. No focal confluent infiltrate is seen. No effusion is noted. No bony abnormality is seen. IMPRESSION: Slight increase in interstitial changes likely representing edema. Electronically Signed   By: Alcide Clever M.D.   On: 02/04/2023 21:57    Catarina Hartshorn, DO  [facility]  If 7PM-7AM, please contact night-coverage [email or web address] Password [email or web address] 02/05/2023, 8:00 AM   LOS: 1 day

## 2023-02-05 NOTE — Hospital Course (Signed)
52 year old female with a history of COPD, hypertension, tobacco abuse, anxiety/depression presenting with worsening shortness of breath and coughing that began on 01/31/2023.  The patient has had some subjective fevers and chills.  Her cough is largely nonproductive.  She denies any hemoptysis.  She denies any headache, vomiting, diarrhea, abdominal pain.  She has had some nausea.  She does have some chest discomfort with coughing.  She denies any rashes.  Notably, she takes about 10-15 ibuprofens per week for chronic back pain.  She denies any dysuria or hematuria. In the ED, the patient was febrile up to 101.7 F.  She was hemodynamically stable.  Oxygen saturation was 84% on room air.  She was placed on 3 L with saturation up to 100%. WBC 18.3, hemoglobin 10.9, platelets 169,000.  Sodium 130, potassium 4.2, bicarbonate 25, serum creatinine 0.77.  Chest x-ray showed increased interstitial markings.  LFTs were unremarkable.  COVID-19 PCR was negative.  Procalcitonin 0.24.  EKG showed sinus rhythm nonspecific ST-T wave changes.

## 2023-02-06 DIAGNOSIS — A419 Sepsis, unspecified organism: Secondary | ICD-10-CM | POA: Diagnosis not present

## 2023-02-06 DIAGNOSIS — J189 Pneumonia, unspecified organism: Secondary | ICD-10-CM | POA: Diagnosis not present

## 2023-02-06 DIAGNOSIS — E871 Hypo-osmolality and hyponatremia: Secondary | ICD-10-CM | POA: Diagnosis not present

## 2023-02-06 DIAGNOSIS — D75839 Thrombocytosis, unspecified: Secondary | ICD-10-CM | POA: Diagnosis not present

## 2023-02-06 DIAGNOSIS — J9601 Acute respiratory failure with hypoxia: Secondary | ICD-10-CM | POA: Diagnosis not present

## 2023-02-06 LAB — BASIC METABOLIC PANEL WITH GFR
Anion gap: 8 (ref 5–15)
BUN: 10 mg/dL (ref 6–20)
CO2: 26 mmol/L (ref 22–32)
Calcium: 8.2 mg/dL — ABNORMAL LOW (ref 8.9–10.3)
Chloride: 104 mmol/L (ref 98–111)
Creatinine, Ser: 0.53 mg/dL (ref 0.44–1.00)
GFR, Estimated: >60 mL/min (ref >60)
Glucose, Bld: 152 mg/dL — ABNORMAL HIGH (ref 70–99)
Potassium: 3.7 mmol/L (ref 3.5–5.1)
Sodium: 138 mmol/L (ref 135–145)

## 2023-02-06 LAB — CBC
HCT: 31.2 % — ABNORMAL LOW (ref 36.0–46.0)
Hemoglobin: 10.2 g/dL — ABNORMAL LOW (ref 12.0–15.0)
MCH: 32.4 pg (ref 26.0–34.0)
MCHC: 32.7 g/dL (ref 30.0–36.0)
MCV: 99 fL (ref 80.0–100.0)
Platelets: 466 10*3/uL — ABNORMAL HIGH (ref 150–400)
RBC: 3.15 MIL/uL — ABNORMAL LOW (ref 3.87–5.11)
RDW: 13.8 % (ref 11.5–15.5)
WBC: 27.6 10*3/uL — ABNORMAL HIGH (ref 4.0–10.5)
nRBC: 0 % (ref 0.0–0.2)

## 2023-02-06 LAB — MAGNESIUM: Magnesium: 1.8 mg/dL (ref 1.7–2.4)

## 2023-02-06 MED ORDER — IPRATROPIUM-ALBUTEROL 0.5-2.5 (3) MG/3ML IN SOLN: 3.0000 mL | Freq: Three times a day (TID) | RESPIRATORY_TRACT | Status: DC

## 2023-02-06 MED ORDER — REVEFENACIN 175 MCG/3ML IN SOLN
175.0000 ug | Freq: Every day | RESPIRATORY_TRACT | Status: DC
  Administered 2023-02-07: 175 ug via RESPIRATORY_TRACT
  Filled 2023-02-06: qty 3

## 2023-02-06 MED ORDER — ALBUTEROL SULFATE (2.5 MG/3ML) 0.083% IN NEBU
INHALATION_SOLUTION | RESPIRATORY_TRACT | Status: AC
  Administered 2023-02-06: 2.5 mg
  Filled 2023-02-06: qty 3

## 2023-02-06 MED ORDER — AZITHROMYCIN 250 MG PO TABS
500.0000 mg | ORAL_TABLET | Freq: Every day | ORAL | Status: DC
  Administered 2023-02-06 – 2023-02-07 (×2): 500 mg via ORAL
  Filled 2023-02-06 (×2): qty 2

## 2023-02-06 NOTE — Progress Notes (Signed)
Patient has had intermittent complaints of wheezing this shift.  Medicated prn with breathing treatments as needed. Patient oxygen slightly I increased to 3.5liters from 3 liters. Patient has overall been stable.Vitals have been stable this shift.

## 2023-02-06 NOTE — Progress Notes (Signed)
PROGRESS NOTE  Sherry [name] NWG:[id number] DOB: [birth year] DOA: 02/04/2023 PCP: Alvina Filbert, MD  Brief History:  52 year old female with a history of COPD, hypertension, tobacco abuse, anxiety/depression presenting with worsening shortness of breath and coughing that began on 01/31/2023.  The patient has had some subjective fevers and chills.  Her cough is largely nonproductive.  She denies any hemoptysis.  She denies any headache, vomiting, diarrhea, abdominal pain.  She has had some nausea.  She does have some chest discomfort with coughing.  She denies any rashes.  Notably, she takes about 10-15 ibuprofens per week for chronic back pain.  She denies any dysuria or hematuria. In the ED, the patient was febrile up to 101.7 F.  She was hemodynamically stable.  Oxygen saturation was 84% on room air.  She was placed on 3 L with saturation up to 100%. WBC 18.3, hemoglobin 10.9, platelets 169,000.  Sodium 130, potassium 4.2, bicarbonate 25, serum creatinine 0.77.  Chest x-ray showed increased interstitial markings.  LFTs were unremarkable.  COVID-19 PCR was negative.  Procalcitonin 0.24.  EKG showed sinus rhythm nonspecific ST-T wave changes.   Assessment/Plan: Acute respiratory failure with hypoxia -Secondary to COPD exacerbation and possible pneumonia -Presented with tachypnea and oxygen saturation 84% on room air -Stable on 3 L>>RA -Wean oxygen for saturation greater 92%   COPD exacerbation -Add Brovana and Pulmicort -Continue DuoNebs -Continue IV Solu-Medrol -Viral respiratory panel+rhinovirus/enterovirus -CT chest--mild cylindrical bronchiectasis;  worsen thickening bronchovascular interstitum with centrilobular GGO; progressive ILD changes -add yupelri   Sepsis -Presented with leukocytosis and fever -secondary to underlying pneumonia -CT chest--mild cylindrical bronchiectasis;  worsen thickening bronchovascular interstitum with centrilobular GGO; progressive ILD changes -UA  negative for pyuria -Follow blood cultures--neg -Continue empiric ceftriaxone and azithromycin -IV fluids   Essential hypertension -Holding metoprolol secondary to soft blood pressure -Hold hydrochlorothiazide   Depression/anxiety -Continue fluoxetine and clonazepam -PDMP reviewed -Clonazepam 0.5 mg, #90, last refill 01/19/2023   Tobacco abuse -Tobacco cessation discussed   Hyponatremia -Secondary to volume depletion -Continue IV fluids>> improving   Iron deficiency anemia -Iron saturation 6%, ferritin 89 -give dose nulecit -start po iron             Family Communication:  fiance at bedside 7/28   Consultants:  none   Code Status:  FULL    DVT Prophylaxis:  [place] Lovenox     Procedures: As Listed in Progress Note Above   Antibiotics: Ceftriaxone 7/26>> Azithro 7/26>>          Subjective: Patient complains of nonproductive cough.  She feels like her breathing is little bit better.  She denies any fevers, chills, chest pain, nausea or vomiting, direct abdominal pain.  Objective: Vitals:   02/06/23 0731 02/06/23 0740 02/06/23 1310 02/06/23 1312  BP:   114/70   Pulse:   94   Resp:   19   Temp:   97.9 F (36.6 C)   TempSrc:   Oral   SpO2: 94% 94% 91% 91%  Weight:      Height:        Intake/Output Summary (Last 24 hours) at 02/06/2023 1637 Last data filed at 02/06/2023 0844 Gross per 24 hour  Intake 370 ml  Output --  Net 370 ml   Weight change:  Exam:  General:  Pt is alert, follows commands appropriately, not in acute distress HEENT: No icterus, No thrush, No neck mass, [place]/AT Cardiovascular: RRR, S1/S2, no rubs, no gallops Respiratory:  Bilateral rales.  Bibasilar expiratory wheeze.  Good air movement. Abdomen: Soft/+BS, non tender, non distended, no guarding Extremities: No edema, No lymphangitis, No petechiae, No rashes, no synovitis   Data Reviewed: I have personally reviewed following labs and imaging studies Basic Metabolic  Panel: Recent Labs  Lab 02/04/23 2137 02/05/23 0457 02/06/23 0359  NA 130* 136 138  K 4.2 3.6 3.7  CL 94* 101 104  CO2 25 28 26   GLUCOSE 125* 213* 152*  BUN 11 9 10   CREATININE 0.77 0.61 0.53  CALCIUM 8.0* 8.2* 8.2*  MG  --  1.8 1.8  PHOS  --  2.9  --    Liver Function Tests: Recent Labs  Lab 02/04/23 2137 02/05/23 0457  AST 31 19  ALT 24 22  ALKPHOS 92 85  BILITOT 0.5 0.5  PROT 6.8 5.9*  ALBUMIN 2.9* 2.5*   No results for input(s): "LIPASE", "AMYLASE" in the last 168 hours. No results for input(s): "AMMONIA" in the last 168 hours. Coagulation Profile: Recent Labs  Lab 02/04/23 2137  INR 1.2   CBC: Recent Labs  Lab 02/04/23 2137 02/05/23 0457 02/06/23 0359  WBC 18.3* 16.6* 27.6*  NEUTROABS 14.5*  --   --   HGB 10.9* 10.4* 10.2*  HCT 33.0* 32.7* 31.2*  MCV 96.8 99.1 99.0  PLT 469* 431* 466*   Cardiac Enzymes: No results for input(s): "CKTOTAL", "CKMB", "CKMBINDEX", "TROPONINI" in the last 168 hours. BNP: Invalid input(s): "POCBNP" CBG: No results for input(s): "GLUCAP" in the last 168 hours. HbA1C: No results for input(s): "HGBA1C" in the last 72 hours. Urine analysis:    Component Value Date/Time   COLORURINE YELLOW 02/04/2023 2108   APPEARANCEUR CLEAR 02/04/2023 2108   LABSPEC 1.010 02/04/2023 2108   PHURINE 6.0 02/04/2023 2108   GLUCOSEU NEGATIVE 02/04/2023 2108   HGBUR NEGATIVE 02/04/2023 2108   BILIRUBINUR NEGATIVE 02/04/2023 2108   KETONESUR NEGATIVE 02/04/2023 2108   PROTEINUR NEGATIVE 02/04/2023 2108   NITRITE NEGATIVE 02/04/2023 2108   LEUKOCYTESUR NEGATIVE 02/04/2023 2108   Sepsis Labs: @LABRCNTIP (procalcitonin:4,lacticidven:4) ) Recent Results (from the past 240 hour(s))  Blood Culture (routine x 2)     Status: None (Preliminary result)   Collection Time: 02/04/23  9:37 PM   Specimen: Left Antecubital; Blood  Result Value Ref Range Status   Specimen Description LEFT ANTECUBITAL  Final   Special Requests   Final    BOTTLES  DRAWN AEROBIC AND ANAEROBIC Blood Culture results may not be optimal due to an excessive volume of blood received in culture bottles   Culture   Final    NO GROWTH 2 DAYS Performed at [facility], [address], [place], [place] [address]    Report Status PENDING  Incomplete  Blood Culture (routine x 2)     Status: None (Preliminary result)   Collection Time: 02/04/23  9:38 PM   Specimen: BLOOD RIGHT FOREARM  Result Value Ref Range Status   Specimen Description BLOOD RIGHT FOREARM  Final   Special Requests   Final    BOTTLES DRAWN AEROBIC AND ANAEROBIC Blood Culture adequate volume   Culture   Final    NO GROWTH 2 DAYS Performed at [facility], [address], [place], [place] [address]    Report Status PENDING  Incomplete  SARS Coronavirus 2 by RT PCR (hospital order, performed in [facility] hospital lab) *cepheid single result test* Anterior Nasal Swab     Status: None   Collection Time: 02/04/23  9:42 PM  Specimen: Anterior Nasal Swab  Result Value Ref Range Status   SARS Coronavirus 2 by RT PCR NEGATIVE NEGATIVE Final    Comment: (NOTE) SARS-CoV-2 target nucleic acids are NOT DETECTED.  The SARS-CoV-2 RNA is generally detectable in upper and lower respiratory specimens during the acute phase of infection. The lowest concentration of SARS-CoV-2 viral copies this assay can detect is 250 copies / mL. A negative result does not preclude SARS-CoV-2 infection and should not be used as the sole basis for treatment or other patient management decisions.  A negative result may occur with improper specimen collection / handling, submission of specimen other than nasopharyngeal swab, presence of viral mutation(s) within the areas targeted by this assay, and inadequate number of viral copies (<250 copies / mL). A negative result must be combined with clinical observations, patient history, and epidemiological information.  Fact Sheet for Patients:    RoadLapTop.co.za  Fact Sheet for Healthcare Providers: http://kim-miller.com/  This test is not yet approved or  cleared by the [place] FDA and has been authorized for detection and/or diagnosis of SARS-CoV-2 by FDA under an Emergency Use Authorization (EUA).  This EUA will remain in effect (meaning this test can be used) for the duration of the COVID-19 declaration under Section 564(b)(1) of the Act, 21 U.S.C. section 360bbb-3(b)(1), unless the authorization is terminated or revoked sooner.  Performed at [facility], [address], [place], [place] [address]   Expectorated Sputum Assessment w Gram Stain, Rflx to Resp Cult     Status: None   Collection Time: 02/05/23  8:10 AM   Specimen: Sputum  Result Value Ref Range Status   Specimen Description SPU  Final   Special Requests NONE  Final   Sputum evaluation   Final    Sputum specimen not acceptable for testing.  Please recollect.   SPOKE TO CRYSTAL RN @ 1405 ON [id number] BY Jule Economy Performed at [facility], [address], [place], [place] [address]    Report Status 02/05/2023 FINAL  Final  Respiratory (~20 pathogens) panel by PCR     Status: Abnormal   Collection Time: 02/05/23  8:13 AM   Specimen: Nasopharyngeal Swab; Respiratory  Result Value Ref Range Status   Adenovirus NOT DETECTED NOT DETECTED Final   Coronavirus 229E NOT DETECTED NOT DETECTED Final    Comment: (NOTE) The Coronavirus on the Respiratory Panel, DOES NOT test for the novel  Coronavirus (2019 nCoV)    Coronavirus HKU1 NOT DETECTED NOT DETECTED Final   Coronavirus NL63 NOT DETECTED NOT DETECTED Final   Coronavirus OC43 NOT DETECTED NOT DETECTED Final   Metapneumovirus NOT DETECTED NOT DETECTED Final   Rhinovirus / Enterovirus DETECTED (A) NOT DETECTED Final   Influenza A NOT DETECTED NOT DETECTED Final   Influenza B NOT DETECTED NOT DETECTED Final   Parainfluenza Virus 1 NOT DETECTED NOT DETECTED  Final   Parainfluenza Virus 2 NOT DETECTED NOT DETECTED Final   Parainfluenza Virus 3 NOT DETECTED NOT DETECTED Final   Parainfluenza Virus 4 NOT DETECTED NOT DETECTED Final   Respiratory Syncytial Virus NOT DETECTED NOT DETECTED Final   Bordetella pertussis NOT DETECTED NOT DETECTED Final   Bordetella Parapertussis NOT DETECTED NOT DETECTED Final   Chlamydophila pneumoniae NOT DETECTED NOT DETECTED Final   Mycoplasma pneumoniae NOT DETECTED NOT DETECTED Final    Comment: Performed at [facility], [address] [place], [place] [address]     Scheduled Meds:  arformoterol  15 mcg Nebulization BID  budesonide (PULMICORT) nebulizer solution  0.5 mg Nebulization BID   clonazePAM  0.5 mg Oral TID   dextromethorphan-guaiFENesin  1 tablet Oral BID   enoxaparin (LOVENOX) injection  40 mg Subcutaneous Q24H   feeding supplement  237 mL Oral BID BM   ipratropium-albuterol  3 mL Nebulization TID   [START ON 02/07/2023] ipratropium-albuterol  3 mL Nebulization Q8H   methylPREDNISolone (SOLU-MEDROL) injection  40 mg Intravenous Q12H   nicotine  21 mg Transdermal Daily   pantoprazole  40 mg Oral Daily   [START ON 02/07/2023] revefenacin  175 mcg Nebulization Daily   Continuous Infusions:  azithromycin Stopped (02/06/23 0006)   cefTRIAXone (ROCEPHIN)  IV 2 g (02/05/23 2041)    Procedures/Studies: CT CHEST WO CONTRAST  Result Date: 02/05/2023 CLINICAL DATA:  52 year old female with history of respiratory illness. EXAM: CT CHEST WITHOUT CONTRAST TECHNIQUE: Multidetector CT imaging of the chest was performed following the standard protocol without IV contrast. RADIATION DOSE REDUCTION: This exam was performed according to the departmental dose-optimization program which includes automated exposure control, adjustment of the mA and/or kV according to patient size and/or use of iterative reconstruction technique. COMPARISON:  Chest CT 07/29/2022. FINDINGS: Cardiovascular: Heart size is normal.  There is no significant pericardial fluid, thickening or pericardial calcification. There is aortic atherosclerosis, as well as atherosclerosis of the great vessels of the mediastinum and the coronary arteries, including calcified atherosclerotic plaque in the left anterior descending and right coronary arteries. Mediastinum/Nodes: No pathologically enlarged mediastinal or hilar lymph nodes. Please note that accurate exclusion of hilar adenopathy is limited on noncontrast CT scans. Esophagus is unremarkable in appearance. No axillary lymphadenopathy. Lungs/Pleura: Again noted are widespread areas of mild cylindrical bronchiectasis, peripheral bronchiolectasis, thickening of the peribronchovascular interstitium and some regional areas of architectural distortion, including what appears to be developing areas of honeycombing scattered throughout the lungs bilaterally, most severe throughout the mid to lower lungs. These findings appear progressive compared to prior studies. In particular, today's examination demonstrates dramatically worsened thickening of the peribronchovascular interstitium with widespread centrilobular ground-glass attenuation micro nodularity and innumerable tiny centrally cavitary peripherally thick walled nodules in a centrilobular distribution. No pleural effusions. Mild centrilobular and paraseptal emphysema. Upper Abdomen: Unremarkable. Musculoskeletal: There are no aggressive appearing lytic or blastic lesions noted in the visualized portions of the skeleton. IMPRESSION: 1. 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 Further clinical evaluation is recommended. 2. Mild centrilobular and paraseptal emphysema; imaging findings suggestive of underlying COPD. 3. Aortic atherosclerosis, in addition to two-vessel  coronary artery disease. Please note that although the presence of coronary artery calcium documents the presence of coronary artery disease, the severity of this disease and any potential stenosis cannot be assessed on this non-gated CT examination. Assessment for potential risk factor modification, dietary therapy or pharmacologic therapy may be warranted, if clinically indicated. Aortic Atherosclerosis (ICD10-I70.0) and Emphysema (ICD10-J43.9). Electronically Signed   By: Trudie Reed M.D.   On: 02/05/2023 13:43   DG Chest Port 1 View  Result Date: 02/04/2023 CLINICAL DATA:  Shortness of breath for several days, initial encounter EXAM: PORTABLE CHEST 1 VIEW COMPARISON:  05/25/2021, CT from 07/29/2022 FINDINGS: Cardiac shadow is within normal limits. Mild interstitial changes are noted bilaterally slightly increased when compared with the prior CT examination. This likely represents some acute superimposed edema. No focal confluent infiltrate is seen. No effusion  is noted. No bony abnormality is seen. IMPRESSION: Slight increase in interstitial changes likely representing edema. Electronically Signed   By: Alcide Clever M.D.   On: 02/04/2023 21:57    Catarina Hartshorn, DO  [facility]  If 7PM-7AM, please contact night-coverage [email or web address] Password [facility] 02/06/2023, 4:37 PM   LOS: 2 days

## 2023-02-06 NOTE — Plan of Care (Signed)

## 2023-02-06 NOTE — Progress Notes (Signed)
Patient in bed after return from bathroom on room air. Saturation 85 , patient showing no signs of dyspnea. Nasal cannula had been turned off. Patient states ," he had me off for about 4 hrs now'. Suspect this is self inflicted by patient turning off oxygen. [place] left running at 3 liters for now.

## 2023-02-07 ENCOUNTER — Ambulatory Visit: Payer: BC Managed Care – PPO | Admitting: Internal Medicine

## 2023-02-07 DIAGNOSIS — J9601 Acute respiratory failure with hypoxia: Secondary | ICD-10-CM | POA: Diagnosis not present

## 2023-02-07 DIAGNOSIS — J189 Pneumonia, unspecified organism: Secondary | ICD-10-CM | POA: Diagnosis not present

## 2023-02-07 DIAGNOSIS — A419 Sepsis, unspecified organism: Secondary | ICD-10-CM | POA: Diagnosis not present

## 2023-02-07 DIAGNOSIS — J441 Chronic obstructive pulmonary disease with (acute) exacerbation: Secondary | ICD-10-CM | POA: Diagnosis not present

## 2023-02-07 DIAGNOSIS — J449 Chronic obstructive pulmonary disease, unspecified: Secondary | ICD-10-CM | POA: Insufficient documentation

## 2023-02-07 MED ORDER — CEFDINIR 300 MG PO CAPS: 300.0000 mg | ORAL_CAPSULE | Freq: Two times a day (BID) | ORAL | 0 refills | Status: DC

## 2023-02-07 MED ORDER — PREDNISONE 10 MG PO TABS: 60.0000 mg | ORAL_TABLET | Freq: Every day | ORAL | 0 refills | Status: DC

## 2023-02-07 MED ORDER — CEFDINIR 300 MG PO CAPS: 300.0000 mg | ORAL_CAPSULE | Freq: Two times a day (BID) | ORAL | Status: DC

## 2023-02-07 MED ORDER — ALBUTEROL SULFATE (2.5 MG/3ML) 0.083% IN NEBU: 2.5000 mg | INHALATION_SOLUTION | RESPIRATORY_TRACT | Status: DC | PRN

## 2023-02-07 MED ORDER — HYDROCODONE BIT-HOMATROP MBR 5-1.5 MG/5ML PO SOLN: 5.0000 mL | ORAL | Status: DC | PRN

## 2023-02-07 MED ORDER — FERROUS SULFATE 325 (65 FE) MG PO TABS: 325.0000 mg | ORAL_TABLET | Freq: Every day | ORAL | Status: DC

## 2023-02-07 MED ORDER — PREDNISONE 20 MG PO TABS: 60.0000 mg | ORAL_TABLET | Freq: Every day | ORAL | Status: DC

## 2023-02-07 MED ORDER — HYDROCODONE BIT-HOMATROP MBR 5-1.5 MG/5ML PO SOLN: 5.0000 mL | ORAL | 0 refills | Status: DC | PRN

## 2023-02-07 MED ORDER — AZITHROMYCIN 500 MG PO TABS: 500.0000 mg | ORAL_TABLET | Freq: Every day | ORAL | 0 refills | Status: DC

## 2023-02-07 MED ORDER — IPRATROPIUM-ALBUTEROL 0.5-2.5 (3) MG/3ML IN SOLN: 3.0000 mL | Freq: Three times a day (TID) | RESPIRATORY_TRACT | 1 refills | Status: DC

## 2023-02-07 MED ORDER — ALBUTEROL SULFATE (2.5 MG/3ML) 0.083% IN NEBU
INHALATION_SOLUTION | RESPIRATORY_TRACT | Status: AC
  Filled 2023-02-07: qty 3

## 2023-02-07 NOTE — Progress Notes (Deleted)
  Cardiology Office Note:  .   Date:  02/07/2023  ID:  Sherry [name], DOB 1970-10-24, MRN [id number] PCP: Alvina Filbert, MD  [facility] Providers Cardiologist:  None { Click to update primary MD,subspecialty MD or APP then REFRESH:1}   History of Present Illness: .   Sherry [name] is a 52 y.o. female with history of chest pain, HLD, COPD and bipolar disorder. Coronary calcium and aortic atherosclerosis on CT. Coronary CTA 01/2021 calcium score 29.7 minimal calcified plaque in RCA and LAD. LDL goal <70.  ROS: ***  Studies Reviewed: Marland Kitchen         Prior CV Studies: {Select studies to display:26339}  Coronary CTA 01/2021 IMPRESSION: 1. Coronary calcium score of 29.7. This was 93rd percentile for age-, race-, and sex-matched controls.   2. Normal coronary origin with right dominance.   3.  There is minimal (<25%) calcified plaque in the RCA and LAD.   4. Recommend aggressive risk factor modification including LDL goal <70.   Chilton Si, MD     Electronically Signed   By: Chilton Si   On: 02/02/2021 16:23  Risk Assessment/Calculations:   {Does this patient have ATRIAL FIBRILLATION?[phone]}         Physical Exam:   VS:  There were no vitals taken for this visit.   Wt Readings from Last 3 Encounters:  02/04/23 113 lb (51.3 kg)  12/03/22 117 lb (53.1 kg)  11/19/22 116 lb 6.5 oz (52.8 kg)    GEN: Well nourished, well developed in no acute distress NECK: No JVD; No carotid bruits CARDIAC: ***RRR, no murmurs, rubs, gallops RESPIRATORY:  Clear to auscultation without rales, wheezing or rhonchi  ABDOMEN: Soft, non-tender, non-distended EXTREMITIES:  No edema; No deformity   ASSESSMENT AND PLAN: .    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 stress test, cath, DCCV, TEE, etc)?   Press F2        :[id number]}  Dispo: ***  Signed, Jacolyn Reedy, PA-C

## 2023-02-07 NOTE — TOC Transition Note (Signed)
Transition of Care [facility]) - CM/SW Discharge Note   Patient Details  Name: Sherry [name] MRN: [id number] Date of Birth: 17-Jun-1971  [facility]) CM/SW Contact:  Leitha Bleak, RN Phone Number: 02/07/2023, 11:42 AM   Clinical Narrative:   Patient discharging home, qualifying for home oxygen. MD also ordered a nebulizer. Referral sent to [facility] with [employer]. Waiting on ETA for delivery, RN updated. Added to AVS.    Final next level of care: Home/Self Care Barriers to Discharge: Barriers Resolved   Patient Goals and CMS Choice     Discharge Placement       Patient and family notified of of transfer: 02/07/23  Discharge Plan and Services Additional resources added to the After Visit Summary for                  DME Arranged: Oxygen DME Agency: [employer] Date DME Agency Contacted: 02/07/23 Time DME Agency Contacted: 1136 Representative spoke with at DME Agency: Marthann Schiller   Social Determinants of Health (SDOH) Interventions SDOH Screenings   Food Insecurity: No Food Insecurity (02/04/2023)  Housing: Low Risk  (02/04/2023)  Transportation Needs: No Transportation Needs (02/04/2023)  Utilities: Not At Risk (02/04/2023)  Tobacco Use: High Risk (02/04/2023)    Readmission Risk Interventions     No data to display

## 2023-02-07 NOTE — Progress Notes (Signed)
SATURATION QUALIFICATIONS: (This note is used to comply with regulatory documentation for home oxygen)   Patient Saturations on Room Air at Rest = 91   Patient Saturations on Room Air while Ambulating = 81   Patient Saturations on 2 Liters of oxygen while Ambulating = 95   Please briefly explain why patient needs home oxygen: To maintain 02 sat at 90% or above during ambulation.  Sherry Hartshorn, DO

## 2023-02-07 NOTE — Progress Notes (Signed)
Patient sleeping without nasal cannula on. Oxygen saturation is 93% on room air.

## 2023-02-07 NOTE — Discharge Summary (Addendum)
Physician Discharge Summary   Patient: Sherry [name] MRN: [id number] DOB: [birth year]  Admit date:     02/04/2023  Discharge date: 02/07/23  Discharge Physician: Onalee Hua Shadae Reino   PCP: Alvina Filbert, MD   Recommendations at discharge:   Please follow up with primary care provider within 1-2 weeks  Please repeat BMP and CBC in one week    Hospital Course: 52 year old female with a history of COPD, hypertension, tobacco abuse, anxiety/depression presenting with worsening shortness of breath and coughing that began on 01/31/2023.  The patient has had some subjective fevers and chills.  Her cough is largely nonproductive.  She denies any hemoptysis.  She denies any headache, vomiting, diarrhea, abdominal pain.  She has had some nausea.  She does have some chest discomfort with coughing.  She denies any rashes.  Notably, she takes about 10-15 ibuprofens per week for chronic back pain.  She denies any dysuria or hematuria. In the ED, the patient was febrile up to 101.7 F.  She was hemodynamically stable.  Oxygen saturation was 84% on room air.  She was placed on 3 L with saturation up to 100%. WBC 18.3, hemoglobin 10.9, platelets 169,000.  Sodium 130, potassium 4.2, bicarbonate 25, serum creatinine 0.77.  Chest x-ray showed increased interstitial markings.  LFTs were unremarkable.  COVID-19 PCR was negative.  Procalcitonin 0.24.  EKG showed sinus rhythm nonspecific ST-T wave changes.  Assessment and Plan: Acute respiratory failure with hypoxia -Secondary to COPD exacerbation and possible pneumonia -Presented with tachypnea and oxygen saturation 84% on room air -Stable on 3 L>>RA -Wean oxygen for saturation greater 92% -desaturated to 81% with ambulation on RA on day of d/c>>set up 2L for home   COPD exacerbation -Added Brovana and Pulmicort -Continue DuoNebs -Continue IV Solu-Medrol>>dc home with prednisone taper -Viral respiratory panel+rhinovirus/enterovirus -CT chest--mild cylindrical  bronchiectasis;  worsen thickening bronchovascular interstitum with centrilobular GGO; progressive ILD changes -added yupelri   Sepsis -Presented with leukocytosis and fever -secondary to underlying pneumonia -CT chest--mild cylindrical bronchiectasis;  worsen thickening bronchovascular interstitum with centrilobular GGO; progressive ILD changes -UA negative for pyuria -Follow blood cultures--neg -Continue empiric ceftriaxone and azithromycin>>d/c home with cefdinir and zithromax x 3 more days -IV fluids given   Essential hypertension -Holding metoprolol secondary to soft blood pressure -Hold hydrochlorothiazide>>will not restart -restart metoprolol after d/c   Depression/anxiety -Continue fluoxetine and clonazepam -PDMP reviewed -Clonazepam 0.5 mg, #90, last refill 01/19/2023   Tobacco abuse -Tobacco cessation discussed   Hyponatremia -Secondary to volume depletion -Continue IV fluids>> improving   Iron deficiency anemia -Iron saturation 6%, ferritin 89 -give dose nulecit -start po iron        Consultants: none Procedures performed: none  Disposition: Home Diet recommendation:  Cardiac diet DISCHARGE MEDICATION: Allergies as of 02/07/2023       Reactions   Dramamine [dimenhydrinate] Swelling, Other (See Comments)   Made her feel really drunk. Swelling of tongue    Prednisone Other (See Comments)   Feels like fingers are swelling        Medication List     STOP taking these medications    hydrochlorothiazide 25 MG tablet Commonly known as: HYDRODIURIL   potassium chloride SA 20 MEQ tablet Commonly known as: KLOR-CON M       TAKE these medications    acetaminophen 500 MG tablet Commonly known as: TYLENOL Take 1,000 mg by mouth every 6 (six) hours as needed (pain.).   albuterol 108 (90 Base) MCG/ACT inhaler Commonly known as: VENTOLIN HFA Inhale  1-2 puffs into the lungs every 4 (four) hours as needed for shortness of breath or wheezing. What  changed: Another medication with the same name was removed. Continue taking this medication, and follow the directions you see here.   aspirin EC 81 MG tablet Take 81 mg by mouth daily. Swallow whole.   azithromycin 500 MG tablet Commonly known as: ZITHROMAX Take 1 tablet (500 mg total) by mouth daily. What changed:  medication strength how much to take when to take this   cefdinir 300 MG capsule Commonly known as: OMNICEF Take 1 capsule (300 mg total) by mouth every 12 (twelve) hours.   clonazePAM 0.5 MG tablet Commonly known as: KLONOPIN Take 0.5 mg by mouth in the morning, at noon, and at bedtime.   famotidine 20 MG tablet Commonly known as: Pepcid One after supper   ferrous sulfate 325 (65 FE) MG tablet Take 1 tablet (325 mg total) by mouth daily with breakfast. Start taking on: February 08, 2023   FLUoxetine 10 MG capsule Commonly known as: PROZAC Take 10 mg by mouth in the morning.   HYDROcodone bit-homatropine 5-1.5 MG/5ML syrup Commonly known as: HYCODAN Take 5 mLs by mouth every 4 (four) hours as needed for cough.   ibuprofen 200 MG tablet Commonly known as: ADVIL Take 600 mg by mouth every 8 (eight) hours as needed for moderate pain (pain.).   ipratropium-albuterol 0.5-2.5 (3) MG/3ML Soln Commonly known as: DUONEB Take 3 mLs by nebulization every 8 (eight) hours.   metoprolol tartrate 50 MG tablet Commonly known as: LOPRESSOR Take 1/2 (one-half) tablet by mouth twice daily   pantoprazole 40 MG tablet Commonly known as: PROTONIX Take 40 mg by mouth daily before breakfast.   predniSONE 10 MG tablet Commonly known as: DELTASONE Take 6 tablets (60 mg total) by mouth daily with breakfast. And decrease by one tablet daily Start taking on: February 08, 2023 What changed:  medication strength how much to take when to take this additional instructions   QUEtiapine 400 MG tablet Commonly known as: SEROquel Take 1 tablet (400 mg total) by mouth at  bedtime. What changed: how much to take   sodium chloride 0.65 % Soln nasal spray Commonly known as: OCEAN Place 1 spray into both nostrils as needed for congestion.   Stiolto Respimat 2.5-2.5 MCG/ACT Aers Generic drug: Tiotropium Bromide-Olodaterol Inhale 2 puffs into the lungs daily.               Durable Medical Equipment  (From admission, onward)           Start     Ordered   02/07/23 1056  For home use only DME oxygen  Once       Question Answer Comment  Length of Need 6 Months   Mode or (Route) Nasal cannula   Liters per Minute 2   Frequency Continuous (stationary and portable oxygen unit needed)   Oxygen conserving device Yes   Oxygen delivery system Gas      02/07/23 1055   02/07/23 1056  For home use only DME Nebulizer machine  Once       Question Answer Comment  Patient needs a nebulizer to treat with the following condition COPD exacerbation (HCC)   Length of Need Lifetime      02/07/23 1055            Discharge Exam: Filed Weights   02/04/23 2057  Weight: 51.3 kg   HEENT:  [place]/AT, No thrush, no icterus CV:  RRR, no rub, no S3, no S4 Lung:  bibasilar rales.  No wheeze Abd:  soft/+BS, NT Ext:  No edema, no lymphangitis, no synovitis, no rash   Condition at discharge: stable  The results of significant diagnostics from this hospitalization (including imaging, microbiology, ancillary and laboratory) are listed below for reference.   Imaging Studies: CT CHEST WO CONTRAST  Result Date: 02/05/2023 CLINICAL DATA:  52 year old female with history of respiratory illness. EXAM: CT CHEST WITHOUT CONTRAST TECHNIQUE: Multidetector CT imaging of the chest was performed following the standard protocol without IV contrast. RADIATION DOSE REDUCTION: This exam was performed according to the departmental dose-optimization program which includes automated exposure control, adjustment of the mA and/or kV according to patient size and/or use of iterative  reconstruction technique. COMPARISON:  Chest CT 07/29/2022. FINDINGS: Cardiovascular: Heart size is normal. There is no significant pericardial fluid, thickening or pericardial calcification. There is aortic atherosclerosis, as well as atherosclerosis of the great vessels of the mediastinum and the coronary arteries, including calcified atherosclerotic plaque in the left anterior descending and right coronary arteries. Mediastinum/Nodes: No pathologically enlarged mediastinal or hilar lymph nodes. Please note that accurate exclusion of hilar adenopathy is limited on noncontrast CT scans. Esophagus is unremarkable in appearance. No axillary lymphadenopathy. Lungs/Pleura: Again noted are widespread areas of mild cylindrical bronchiectasis, peripheral bronchiolectasis, thickening of the peribronchovascular interstitium and some regional areas of architectural distortion, including what appears to be developing areas of honeycombing scattered throughout the lungs bilaterally, most severe throughout the mid to lower lungs. These findings appear progressive compared to prior studies. In particular, today's examination demonstrates dramatically worsened thickening of the peribronchovascular interstitium with widespread centrilobular ground-glass attenuation micro nodularity and innumerable tiny centrally cavitary peripherally thick walled nodules in a centrilobular distribution. No pleural effusions. Mild centrilobular and paraseptal emphysema. Upper Abdomen: Unremarkable. Musculoskeletal: There are no aggressive appearing lytic or blastic lesions noted in the visualized portions of the skeleton. IMPRESSION: 1. 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 Further clinical evaluation is recommended. 2. Mild centrilobular and paraseptal  emphysema; imaging findings suggestive of underlying COPD. 3. Aortic atherosclerosis, in addition to two-vessel coronary artery disease. Please note that although the presence of coronary artery calcium documents the presence of coronary artery disease, the severity of this disease and any potential stenosis cannot be assessed on this non-gated CT examination. Assessment for potential risk factor modification, dietary therapy or pharmacologic therapy may be warranted, if clinically indicated. Aortic Atherosclerosis (ICD10-I70.0) and Emphysema (ICD10-J43.9). Electronically Signed   By: Trudie Reed M.D.   On: 02/05/2023 13:43   DG Chest Port 1 View  Result Date: 02/04/2023 CLINICAL DATA:  Shortness of breath for several days, initial encounter EXAM: PORTABLE CHEST 1 VIEW COMPARISON:  05/25/2021, CT from 07/29/2022 FINDINGS: Cardiac shadow is within normal limits. Mild interstitial changes are noted bilaterally slightly increased when compared with the prior CT examination. This likely represents some acute superimposed edema. No focal confluent infiltrate is seen. No effusion is noted. No bony abnormality is seen. IMPRESSION: Slight increase in interstitial changes likely representing edema. Electronically Signed   By: Alcide Clever M.D.   On: 02/04/2023 21:57    Microbiology: Results for orders placed or performed during the hospital encounter of 02/04/23  Blood Culture (routine x 2)     Status: None (Preliminary result)   Collection Time: 02/04/23  9:37 PM   Specimen: Left Antecubital; Blood  Result Value Ref Range Status   Specimen Description LEFT ANTECUBITAL  Final   Special Requests   Final    BOTTLES DRAWN AEROBIC AND ANAEROBIC Blood Culture results may not be optimal due to an excessive volume of blood received in culture bottles   Culture   Final    NO GROWTH 3 DAYS Performed at [facility], [address], [place], [place] [address]    Report Status PENDING  Incomplete  Blood Culture  (routine x 2)     Status: None (Preliminary result)   Collection Time: 02/04/23  9:38 PM   Specimen: BLOOD RIGHT FOREARM  Result Value Ref Range Status   Specimen Description BLOOD RIGHT FOREARM  Final   Special Requests   Final    BOTTLES DRAWN AEROBIC AND ANAEROBIC Blood Culture adequate volume   Culture   Final    NO GROWTH 3 DAYS Performed at [facility], [address] [place], [place] [address]    Report Status PENDING  Incomplete  SARS Coronavirus 2 by RT PCR (hospital order, performed in [facility] hospital lab) *cepheid single result test* Anterior Nasal Swab     Status: None   Collection Time: 02/04/23  9:42 PM   Specimen: Anterior Nasal Swab  Result Value Ref Range Status   SARS Coronavirus 2 by RT PCR NEGATIVE NEGATIVE Final    Comment: (NOTE) SARS-CoV-2 target nucleic acids are NOT DETECTED.  The SARS-CoV-2 RNA is generally detectable in upper and lower respiratory specimens during the acute phase of infection. The lowest concentration of SARS-CoV-2 viral copies this assay can detect is 250 copies / mL. A negative result does not preclude SARS-CoV-2 infection and should not be used as the sole basis for treatment or other patient management decisions.  A negative result may occur with improper specimen collection / handling, submission of specimen other than nasopharyngeal swab, presence of viral mutation(s) within the areas targeted by this assay, and inadequate number of viral copies (<250 copies / mL). A negative result must be combined with clinical observations, patient history, and epidemiological information.  Fact Sheet for Patients:   RoadLapTop.co.za  Fact Sheet for Healthcare Providers: http://kim-miller.com/  This test is not yet approved or  cleared by the [place] FDA and has been authorized for detection and/or diagnosis of SARS-CoV-2 by FDA under an Emergency Use Authorization (EUA).  This EUA will  remain in effect (meaning this test can be used) for the duration of the COVID-19 declaration under Section 564(b)(1) of the Act, 21 U.S.C. section 360bbb-3(b)(1), unless the authorization is terminated or revoked sooner.  Performed at [facility], [address] [place], [place] [address]   Expectorated Sputum Assessment w Gram Stain, Rflx to Resp Cult     Status: None   Collection Time: 02/05/23  8:10 AM   Specimen: Sputum  Result Value Ref Range Status   Specimen Description SPU  Final   Special Requests NONE  Final   Sputum evaluation   Final    Sputum specimen not acceptable for testing.  Please recollect.   SPOKE TO CRYSTAL RN @ 1405 ON [id number] BY Jule Economy Performed at [facility], [address], [place], [place] [address]    Report Status 02/05/2023 FINAL  Final  Respiratory (~20 pathogens) panel by PCR     Status: Abnormal   Collection Time: 02/05/23  8:13 AM   Specimen: Nasopharyngeal Swab; Respiratory  Result Value Ref Range Status  Adenovirus NOT DETECTED NOT DETECTED Final   Coronavirus 229E NOT DETECTED NOT DETECTED Final    Comment: (NOTE) The Coronavirus on the Respiratory Panel, DOES NOT test for the novel  Coronavirus (2019 nCoV)    Coronavirus HKU1 NOT DETECTED NOT DETECTED Final   Coronavirus NL63 NOT DETECTED NOT DETECTED Final   Coronavirus OC43 NOT DETECTED NOT DETECTED Final   Metapneumovirus NOT DETECTED NOT DETECTED Final   Rhinovirus / Enterovirus DETECTED (A) NOT DETECTED Final   Influenza A NOT DETECTED NOT DETECTED Final   Influenza B NOT DETECTED NOT DETECTED Final   Parainfluenza Virus 1 NOT DETECTED NOT DETECTED Final   Parainfluenza Virus 2 NOT DETECTED NOT DETECTED Final   Parainfluenza Virus 3 NOT DETECTED NOT DETECTED Final   Parainfluenza Virus 4 NOT DETECTED NOT DETECTED Final   Respiratory Syncytial Virus NOT DETECTED NOT DETECTED Final   Bordetella pertussis NOT DETECTED NOT DETECTED Final   Bordetella Parapertussis NOT DETECTED  NOT DETECTED Final   Chlamydophila pneumoniae NOT DETECTED NOT DETECTED Final   Mycoplasma pneumoniae NOT DETECTED NOT DETECTED Final    Comment: Performed at [facility], [address], [place], [place] [address]    Labs: CBC: Recent Labs  Lab 02/04/23 2137 02/05/23 0457 02/06/23 0359  WBC 18.3* 16.6* 27.6*  NEUTROABS 14.5*  --   --   HGB 10.9* 10.4* 10.2*  HCT 33.0* 32.7* 31.2*  MCV 96.8 99.1 99.0  PLT 469* 431* 466*   Basic Metabolic Panel: Recent Labs  Lab 02/04/23 2137 02/05/23 0457 02/06/23 0359  NA 130* 136 138  K 4.2 3.6 3.7  CL 94* 101 104  CO2 25 28 26   GLUCOSE 125* 213* 152*  BUN 11 9 10   CREATININE 0.77 0.61 0.53  CALCIUM 8.0* 8.2* 8.2*  MG  --  1.8 1.8  PHOS  --  2.9  --    Liver Function Tests: Recent Labs  Lab 02/04/23 2137 02/05/23 0457  AST 31 19  ALT 24 22  ALKPHOS 92 85  BILITOT 0.5 0.5  PROT 6.8 5.9*  ALBUMIN 2.9* 2.5*   CBG: No results for input(s): "GLUCAP" in the last 168 hours.  Discharge time spent: greater than 30 minutes.  Signed: Catarina Hartshorn, MD [facility] 02/07/2023

## 2023-02-07 NOTE — Progress Notes (Deleted)
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   S/p ptx age 52     History of Present Illness  09/21/2022  Pulmonary/ 1st office eval/  / Sales executive Complaint  Patient presents with   Consult    COPD./ Asthma/ Lung nodules/ Unexplained weight loss X 1 year   Dyspnea:  walking all day at work 12 h, 18 steps to get to appt so far can do s stopping  Cough: variable, not worse 1st thing in am but worse w/in 30 min and disturbs sleep >  min productive/ mucoid  Sleep: on L side/ flat bed  SABA use: last used around 7 h prior to OV   02: none  Lung cancer screen: in program  Rec Plan A = Automatic = Always=    Stiolto 2 puffs each am  Work on inhaler technique:  Plan B = Backup (to supplement plan A, not to replace it) Only use your albuterol inhaler as a rescue medication  For cough > mucinex dm 1200 mg twice daily as needed  My office will be contacting you by phone for referral to orthopedic surgery   Pantoprazole (protonix) 40 mg   Take  30-60 min before first meal of the day and Pepcid (famotidine)  20 mg after supper until return to office -  Please remember to go to the lab department   EOS  0  alpha one AT phenotype  MM  level 206       Please schedule a follow up office visit in 6 weeks, call sooner if needed - bring inhalers    11/02/2022  f/u ov/[place] office/ re: GOLD? copd maint on stiolto  and did not  bring inhalers as requested  Chief Complaint  Patient presents with   Follow-up    Pt f/u states that her breathing is better, no questions or concerns.   Dyspnea:  much less active due to  L hip pain  Cough: better despite still smoking  Sleeping: bed is flat 2 pillows/ min am sputum  SABA use: maybe once a day p  exertion .  02: none  Rec No change in medications  - Bring Inhalers with you to next visit  Please schedule a follow up visit in 3 months but call sooner if needed PFT on return    02/07/2023  f/u ov/[place] office/ re: *** maint on ***  No chief complaint on file.   Dyspnea:  *** Cough: *** Sleeping: *** SABA use: *** 02: *** Covid status: *** Lung cancer screening: ***   No obvious day to day or daytime variability or assoc excess/ purulent sputum or mucus plugs or hemoptysis or cp or chest tightness, subjective wheeze or overt sinus or hb symptoms.   *** without nocturnal  or early am exacerbation  of respiratory  c/o's or need for noct saba. Also denies any obvious fluctuation of symptoms with weather or environmental changes or other aggravating or alleviating factors except as outlined above   No unusual exposure hx or h/o childhood pna/ asthma or knowledge of premature birth.  Current Allergies, Complete Past Medical History, Past Surgical History, Family History, and Social History were reviewed in [employer] [facility]  electronic medical record.  ROS  The following are not active complaints unless bolded Hoarseness, sore throat, dysphagia, dental problems, itching, sneezing,  nasal congestion or discharge of excess mucus or purulent secretions, ear ache,   fever, chills, sweats, unintended wt loss or wt gain, classically pleuritic or exertional cp,  orthopnea pnd or arm/hand swelling  or leg swelling, presyncope, palpitations, abdominal pain, anorexia, nausea, vomiting, diarrhea  or change in bowel habits or change in bladder habits, change in stools or change in urine, dysuria, hematuria,  rash, arthralgias, visual complaints, headache, numbness, weakness or ataxia or problems with walking or coordination,  change in mood or  memory.        No outpatient medications have been marked as taking for the 02/07/23 encounter (Appointment) with Nyoka Cowden, MD.                Objective:    wts  02/07/2023        ***  11/02/2022       116    09/21/22 120 lb 6.4 oz (54.6 kg)  07/13/22 128 lb (58.1 kg)  03/17/22 142 lb (64.4 kg)   Vital signs reviewed  02/07/2023  - Note at rest 02 sats  ***% on ***   General appearance:    ***    Min barr***   Mild clubbing              I personally reviewed images and agree with radiology impression as follows:   Chest CTw contrast 07/29/22    1. Mild emphysema. 2. Mild progression of peripheral fibrotic changes in the lower lungs. 3. Stable mildly enlarged precarinal lymph node.               Assessment

## 2023-02-08 ENCOUNTER — Telehealth: Payer: Self-pay | Admitting: Physical Medicine and Rehabilitation

## 2023-02-08 NOTE — Telephone Encounter (Signed)
Patient called advised she just got out of the hospital and will need to cancel and reschedule her appointment for a later date. The number to contact patient is [phone]

## 2023-02-08 NOTE — Telephone Encounter (Signed)
Spoke with patient and she stated she will call back once she feels better

## 2023-02-16 ENCOUNTER — Encounter: Payer: BC Managed Care – PPO | Admitting: Physical Medicine and Rehabilitation

## 2023-02-17 ENCOUNTER — Other Ambulatory Visit: Payer: Self-pay | Admitting: Cardiovascular Disease

## 2023-02-21 ENCOUNTER — Ambulatory Visit: Payer: BC Managed Care – PPO | Admitting: Physician Assistant

## 2023-02-25 ENCOUNTER — Ambulatory Visit: Payer: BC Managed Care – PPO | Admitting: Orthopedic Surgery

## 2023-03-02 ENCOUNTER — Other Ambulatory Visit (HOSPITAL_COMMUNITY): Payer: Self-pay | Admitting: Internal Medicine

## 2023-03-02 DIAGNOSIS — Z1231 Encounter for screening mammogram for malignant neoplasm of breast: Secondary | ICD-10-CM

## 2023-03-09 ENCOUNTER — Ambulatory Visit (HOSPITAL_COMMUNITY)
Admission: RE | Admit: 2023-03-09 | Discharge: 2023-03-09 | Disposition: A | Discharge status: Home / Self Care | Payer: BC Managed Care – PPO | Source: Ambulatory Visit | Attending: Internal Medicine | Admitting: Internal Medicine

## 2023-03-09 DIAGNOSIS — Z1231 Encounter for screening mammogram for malignant neoplasm of breast: Secondary | ICD-10-CM | POA: Insufficient documentation

## 2023-05-13 ENCOUNTER — Other Ambulatory Visit: Payer: Self-pay | Admitting: Cardiovascular Disease

## 2023-06-14 ENCOUNTER — Other Ambulatory Visit (HOSPITAL_COMMUNITY): Payer: Self-pay

## 2023-06-14 ENCOUNTER — Other Ambulatory Visit: Payer: Self-pay

## 2023-06-14 MED ORDER — METOPROLOL TARTRATE 50 MG PO TABS
ORAL_TABLET | ORAL | 0 refills | Status: DC
  Filled 2023-06-14: qty 15, 15d supply, fill #0

## 2023-09-11 ENCOUNTER — Other Ambulatory Visit: Payer: Self-pay | Admitting: Internal Medicine

## 2023-09-17 ENCOUNTER — Other Ambulatory Visit: Payer: Self-pay | Admitting: Internal Medicine

## 2023-10-08 ENCOUNTER — Other Ambulatory Visit: Payer: Self-pay | Admitting: Internal Medicine

## 2023-11-03 ENCOUNTER — Other Ambulatory Visit: Payer: Self-pay | Admitting: Internal Medicine

## 2023-11-03 NOTE — Telephone Encounter (Signed)
Appointment needed for refill.  

## 2023-12-16 ENCOUNTER — Other Ambulatory Visit: Payer: Self-pay | Admitting: Internal Medicine

## 2024-03-14 ENCOUNTER — Encounter (HOSPITAL_COMMUNITY): Payer: Self-pay | Admitting: Emergency Medicine

## 2024-03-14 ENCOUNTER — Emergency Department (HOSPITAL_COMMUNITY): Payer: MEDICAID

## 2024-03-14 ENCOUNTER — Other Ambulatory Visit: Payer: Self-pay

## 2024-03-14 ENCOUNTER — Observation Stay (HOSPITAL_COMMUNITY): Payer: Self-pay

## 2024-03-14 ENCOUNTER — Observation Stay (HOSPITAL_COMMUNITY): Payer: MEDICAID

## 2024-03-14 ENCOUNTER — Emergency Department (HOSPITAL_COMMUNITY): Payer: Self-pay

## 2024-03-14 ENCOUNTER — Inpatient Hospital Stay (HOSPITAL_COMMUNITY)
Admission: EM | Admit: 2024-03-14 | Discharge: 2024-03-24 | DRG: 871 | Disposition: A | Discharge status: Home / Self Care | Payer: MEDICAID | Attending: Internal Medicine | Admitting: Internal Medicine

## 2024-03-14 DIAGNOSIS — E872 Acidosis, unspecified: Secondary | ICD-10-CM | POA: Diagnosis not present

## 2024-03-14 DIAGNOSIS — E860 Dehydration: Secondary | ICD-10-CM | POA: Diagnosis present

## 2024-03-14 DIAGNOSIS — F32A Depression, unspecified: Secondary | ICD-10-CM | POA: Diagnosis not present

## 2024-03-14 DIAGNOSIS — F419 Anxiety disorder, unspecified: Secondary | ICD-10-CM | POA: Diagnosis present

## 2024-03-14 DIAGNOSIS — Z888 Allergy status to other drugs, medicaments and biological substances status: Secondary | ICD-10-CM | POA: Diagnosis not present

## 2024-03-14 DIAGNOSIS — R509 Fever, unspecified: Secondary | ICD-10-CM | POA: Diagnosis not present

## 2024-03-14 DIAGNOSIS — I10 Essential (primary) hypertension: Secondary | ICD-10-CM | POA: Diagnosis not present

## 2024-03-14 DIAGNOSIS — Z79899 Other long term (current) drug therapy: Secondary | ICD-10-CM | POA: Diagnosis not present

## 2024-03-14 DIAGNOSIS — I6783 Posterior reversible encephalopathy syndrome: Secondary | ICD-10-CM | POA: Diagnosis not present

## 2024-03-14 DIAGNOSIS — R569 Unspecified convulsions: Secondary | ICD-10-CM | POA: Diagnosis not present

## 2024-03-14 DIAGNOSIS — G0481 Other encephalitis and encephalomyelitis: Secondary | ICD-10-CM | POA: Diagnosis not present

## 2024-03-14 DIAGNOSIS — Z5971 Insufficient health insurance coverage: Secondary | ICD-10-CM

## 2024-03-14 DIAGNOSIS — R0609 Other forms of dyspnea: Secondary | ICD-10-CM | POA: Diagnosis not present

## 2024-03-14 DIAGNOSIS — Z800 Family history of malignant neoplasm of digestive organs: Secondary | ICD-10-CM

## 2024-03-14 DIAGNOSIS — G039 Meningitis, unspecified: Secondary | ICD-10-CM | POA: Diagnosis not present

## 2024-03-14 DIAGNOSIS — E876 Hypokalemia: Secondary | ICD-10-CM | POA: Diagnosis present

## 2024-03-14 DIAGNOSIS — Z7982 Long term (current) use of aspirin: Secondary | ICD-10-CM | POA: Diagnosis not present

## 2024-03-14 DIAGNOSIS — D649 Anemia, unspecified: Secondary | ICD-10-CM | POA: Diagnosis present

## 2024-03-14 DIAGNOSIS — M545 Low back pain, unspecified: Secondary | ICD-10-CM | POA: Diagnosis present

## 2024-03-14 DIAGNOSIS — E7800 Pure hypercholesterolemia, unspecified: Secondary | ICD-10-CM | POA: Diagnosis present

## 2024-03-14 DIAGNOSIS — E871 Hypo-osmolality and hyponatremia: Secondary | ICD-10-CM | POA: Diagnosis present

## 2024-03-14 DIAGNOSIS — F17210 Nicotine dependence, cigarettes, uncomplicated: Secondary | ICD-10-CM | POA: Diagnosis present

## 2024-03-14 DIAGNOSIS — N179 Acute kidney failure, unspecified: Secondary | ICD-10-CM | POA: Diagnosis not present

## 2024-03-14 DIAGNOSIS — G9341 Metabolic encephalopathy: Secondary | ICD-10-CM | POA: Diagnosis present

## 2024-03-14 DIAGNOSIS — A419 Sepsis, unspecified organism: Secondary | ICD-10-CM | POA: Diagnosis not present

## 2024-03-14 DIAGNOSIS — R6520 Severe sepsis without septic shock: Secondary | ICD-10-CM | POA: Diagnosis present

## 2024-03-14 DIAGNOSIS — R197 Diarrhea, unspecified: Secondary | ICD-10-CM | POA: Diagnosis present

## 2024-03-14 DIAGNOSIS — R6510 Systemic inflammatory response syndrome (SIRS) of non-infectious origin without acute organ dysfunction: Secondary | ICD-10-CM | POA: Diagnosis present

## 2024-03-14 DIAGNOSIS — Z860100 Personal history of colon polyps, unspecified: Secondary | ICD-10-CM

## 2024-03-14 DIAGNOSIS — Z7951 Long term (current) use of inhaled steroids: Secondary | ICD-10-CM

## 2024-03-14 DIAGNOSIS — Z1152 Encounter for screening for COVID-19: Secondary | ICD-10-CM

## 2024-03-14 DIAGNOSIS — G9340 Encephalopathy, unspecified: Secondary | ICD-10-CM | POA: Diagnosis not present

## 2024-03-14 DIAGNOSIS — R4182 Altered mental status, unspecified: Secondary | ICD-10-CM | POA: Diagnosis not present

## 2024-03-14 DIAGNOSIS — G009 Bacterial meningitis, unspecified: Secondary | ICD-10-CM | POA: Diagnosis not present

## 2024-03-14 DIAGNOSIS — J449 Chronic obstructive pulmonary disease, unspecified: Secondary | ICD-10-CM | POA: Diagnosis present

## 2024-03-14 DIAGNOSIS — Z83719 Family history of colon polyps, unspecified: Secondary | ICD-10-CM

## 2024-03-14 DIAGNOSIS — R000 Tachycardia, unspecified: Secondary | ICD-10-CM | POA: Diagnosis not present

## 2024-03-14 DIAGNOSIS — D72829 Elevated white blood cell count, unspecified: Secondary | ICD-10-CM | POA: Diagnosis not present

## 2024-03-14 LAB — URINALYSIS, ROUTINE W REFLEX MICROSCOPIC
Bacteria, UA: NONE SEEN
Bilirubin Urine: NEGATIVE
Glucose, UA: 50 mg/dL — AB
Ketones, ur: 5 mg/dL — AB
Leukocytes,Ua: NEGATIVE
Nitrite: NEGATIVE
Protein, ur: 100 mg/dL — AB
Specific Gravity, Urine: 1.019 (ref 1.005–1.030)
pH: 6 (ref 5.0–8.0)

## 2024-03-14 LAB — HIV ANTIBODY (ROUTINE TESTING W REFLEX): HIV Screen 4th Generation wRfx: NONREACTIVE

## 2024-03-14 LAB — CBC WITH DIFFERENTIAL/PLATELET
Abs Immature Granulocytes: 0.45 K/uL — ABNORMAL HIGH (ref 0.00–0.07)
Basophils Absolute: 0.1 K/uL (ref 0.0–0.1)
Basophils Relative: 0 %
Eosinophils Absolute: 0 K/uL (ref 0.0–0.5)
Eosinophils Relative: 0 %
HCT: 45.8 % (ref 36.0–46.0)
Hemoglobin: 15.7 g/dL — ABNORMAL HIGH (ref 12.0–15.0)
Immature Granulocytes: 2 %
Lymphocytes Relative: 4 %
Lymphs Abs: 1.2 K/uL (ref 0.7–4.0)
MCH: 32.3 pg (ref 26.0–34.0)
MCHC: 34.3 g/dL (ref 30.0–36.0)
MCV: 94.2 fL (ref 80.0–100.0)
Monocytes Absolute: 1.1 K/uL — ABNORMAL HIGH (ref 0.1–1.0)
Monocytes Relative: 4 %
Neutro Abs: 25.6 K/uL — ABNORMAL HIGH (ref 1.7–7.7)
Neutrophils Relative %: 90 %
Platelets: 469 K/uL — ABNORMAL HIGH (ref 150–400)
RBC: 4.86 MIL/uL (ref 3.87–5.11)
RDW: 13.7 % (ref 11.5–15.5)
WBC: 28.4 K/uL — ABNORMAL HIGH (ref 4.0–10.5)
nRBC: 0 % (ref 0.0–0.2)

## 2024-03-14 LAB — BASIC METABOLIC PANEL WITH GFR
Anion gap: 18 — ABNORMAL HIGH (ref 5–15)
BUN: 17 mg/dL (ref 6–20)
CO2: 24 mmol/L (ref 22–32)
Calcium: 9.6 mg/dL (ref 8.9–10.3)
Chloride: 99 mmol/L (ref 98–111)
Creatinine, Ser: 1.07 mg/dL — ABNORMAL HIGH (ref 0.44–1.00)
GFR, Estimated: >60 mL/min (ref >60)
Glucose, Bld: 154 mg/dL — ABNORMAL HIGH (ref 70–99)
Potassium: 3.1 mmol/L — ABNORMAL LOW (ref 3.5–5.1)
Sodium: 141 mmol/L (ref 135–145)

## 2024-03-14 LAB — VITAMIN B12: Vitamin B-12: 399 pg/mL (ref 180–914)

## 2024-03-14 LAB — RESP PANEL BY RT-PCR (RSV, FLU A&B, COVID)  RVPGX2
Influenza A by PCR: NEGATIVE
Influenza B by PCR: NEGATIVE
Resp Syncytial Virus by PCR: NEGATIVE
SARS Coronavirus 2 by RT PCR: NEGATIVE

## 2024-03-14 LAB — RPR: RPR Ser Ql: NONREACTIVE

## 2024-03-14 LAB — BLOOD GAS, VENOUS
Acid-Base Excess: 6 mmol/L — ABNORMAL HIGH (ref 0.0–2.0)
Bicarbonate: 30.6 mmol/L — ABNORMAL HIGH (ref 20.0–28.0)
Drawn by: 53361
O2 Saturation: 27.5 %
Patient temperature: 37.1
pCO2, Ven: 43 mmHg — ABNORMAL LOW (ref 44–60)
pH, Ven: 7.5 — ABNORMAL HIGH (ref 7.25–7.43)
pO2, Ven: 26 mmHg — CL (ref 32–45)

## 2024-03-14 LAB — LACTIC ACID, PLASMA
Lactic Acid, Venous: 6.1 mmol/L (ref 0.5–1.9)
Lactic Acid, Venous: 2.6 mmol/L (ref 0.5–1.9)
Lactic Acid, Venous: 1.3 mmol/L (ref 0.5–1.9)

## 2024-03-14 LAB — RAPID URINE DRUG SCREEN, HOSP PERFORMED
Amphetamines: NOT DETECTED
Barbiturates: NOT DETECTED
Benzodiazepines: POSITIVE — AB
Cocaine: NOT DETECTED
Opiates: NOT DETECTED
Tetrahydrocannabinol: POSITIVE — AB

## 2024-03-14 LAB — PROCALCITONIN: Procalcitonin: <0.1 ng/mL

## 2024-03-14 LAB — HEPATIC FUNCTION PANEL
ALT: 19 U/L (ref 0–44)
AST: 39 U/L (ref 15–41)
Albumin: 3.8 g/dL (ref 3.5–5.0)
Alkaline Phosphatase: 78 U/L (ref 38–126)
Bilirubin, Direct: 0.1 mg/dL (ref 0.0–0.2)
Indirect Bilirubin: 0.6 mg/dL (ref 0.3–0.9)
Total Bilirubin: 0.7 mg/dL (ref 0.0–1.2)
Total Protein: 6.9 g/dL (ref 6.5–8.1)

## 2024-03-14 LAB — AMMONIA: Ammonia: 32 umol/L (ref 9–35)

## 2024-03-14 LAB — PHOSPHORUS: Phosphorus: 3.4 mg/dL (ref 2.5–4.6)

## 2024-03-14 LAB — TSH: TSH: 3.326 u[IU]/mL (ref 0.350–4.500)

## 2024-03-14 LAB — MAGNESIUM: Magnesium: 2 mg/dL (ref 1.7–2.4)

## 2024-03-14 MED ORDER — SODIUM CHLORIDE 0.9 % IV SOLN: INTRAVENOUS | Status: AC

## 2024-03-14 MED ORDER — KCL IN DEXTROSE-NACL 20-5-0.45 MEQ/L-%-% IV SOLN: INTRAVENOUS | Status: DC

## 2024-03-14 MED ORDER — SODIUM CHLORIDE 0.9 % IV SOLN
2.0000 g | Freq: Two times a day (BID) | INTRAVENOUS | Status: DC
  Administered 2024-03-14 – 2024-03-15 (×2): 2 g via INTRAVENOUS
  Filled 2024-03-14 (×2): qty 12.5

## 2024-03-14 MED ORDER — VANCOMYCIN HCL IN DEXTROSE 1-5 GM/200ML-% IV SOLN
1000.0000 mg | Freq: Once | INTRAVENOUS | Status: AC
  Administered 2024-03-14: 1000 mg via INTRAVENOUS
  Filled 2024-03-14: qty 200

## 2024-03-14 MED ORDER — LACTATED RINGERS IV BOLUS
1000.0000 mL | Freq: Once | INTRAVENOUS | Status: AC
  Administered 2024-03-14: 1000 mL via INTRAVENOUS

## 2024-03-14 MED ORDER — IPRATROPIUM-ALBUTEROL 0.5-2.5 (3) MG/3ML IN SOLN
3.0000 mL | Freq: Once | RESPIRATORY_TRACT | Status: AC
  Administered 2024-03-14: 3 mL via RESPIRATORY_TRACT
  Filled 2024-03-14: qty 3

## 2024-03-14 MED ORDER — PANTOPRAZOLE SODIUM 40 MG PO TBEC
40.0000 mg | DELAYED_RELEASE_TABLET | Freq: Every day | ORAL | Status: DC
  Administered 2024-03-16 – 2024-03-24 (×9): 40 mg via ORAL
  Filled 2024-03-14 (×10): qty 1

## 2024-03-14 MED ORDER — VANCOMYCIN HCL IN DEXTROSE 1-5 GM/200ML-% IV SOLN
1000.0000 mg | INTRAVENOUS | Status: DC
  Administered 2024-03-15 – 2024-03-17 (×3): 1000 mg via INTRAVENOUS
  Filled 2024-03-14 (×4): qty 200

## 2024-03-14 MED ORDER — VANCOMYCIN VARIABLE DOSE PER UNSTABLE RENAL FUNCTION (PHARMACIST DOSING): Status: DC

## 2024-03-14 MED ORDER — MIDAZOLAM HCL 2 MG/2ML IJ SOLN
2.0000 mg | Freq: Once | INTRAMUSCULAR | Status: AC
  Administered 2024-03-14: 2 mg via INTRAMUSCULAR
  Filled 2024-03-14: qty 2

## 2024-03-14 MED ORDER — ENOXAPARIN SODIUM 40 MG/0.4ML IJ SOSY
40.0000 mg | PREFILLED_SYRINGE | INTRAMUSCULAR | Status: DC
  Administered 2024-03-14 – 2024-03-23 (×10): 40 mg via SUBCUTANEOUS
  Filled 2024-03-14 (×11): qty 0.4

## 2024-03-14 MED ORDER — POTASSIUM CHLORIDE 10 MEQ/100ML IV SOLN
10.0000 meq | INTRAVENOUS | Status: AC
  Administered 2024-03-14 (×2): 10 meq via INTRAVENOUS
  Filled 2024-03-14 (×2): qty 100

## 2024-03-14 MED ORDER — SODIUM CHLORIDE 0.9% FLUSH
3.0000 mL | Freq: Two times a day (BID) | INTRAVENOUS | Status: DC
  Administered 2024-03-14 – 2024-03-24 (×19): 3 mL via INTRAVENOUS

## 2024-03-14 MED ORDER — ACETAMINOPHEN 650 MG RE SUPP
650.0000 mg | Freq: Four times a day (QID) | RECTAL | Status: DC | PRN
  Administered 2024-03-15: 650 mg via RECTAL
  Filled 2024-03-14: qty 1

## 2024-03-14 MED ORDER — POTASSIUM CHLORIDE CRYS ER 20 MEQ PO TBCR: 40.0000 meq | EXTENDED_RELEASE_TABLET | ORAL | Status: DC

## 2024-03-14 MED ORDER — MIDAZOLAM HCL 2 MG/2ML IJ SOLN
2.0000 mg | Freq: Once | INTRAMUSCULAR | Status: AC
  Administered 2024-03-14: 2 mg via INTRAVENOUS
  Filled 2024-03-14: qty 2

## 2024-03-14 MED ORDER — METRONIDAZOLE 500 MG/100ML IV SOLN
500.0000 mg | Freq: Two times a day (BID) | INTRAVENOUS | Status: AC
  Administered 2024-03-14 – 2024-03-15 (×3): 500 mg via INTRAVENOUS
  Filled 2024-03-14 (×3): qty 100

## 2024-03-14 MED ORDER — SODIUM CHLORIDE 0.9 % IV SOLN
2.0000 g | Freq: Once | INTRAVENOUS | Status: AC
  Administered 2024-03-14: 2 g via INTRAVENOUS
  Filled 2024-03-14: qty 12.5

## 2024-03-14 MED ORDER — ACETAMINOPHEN 325 MG PO TABS
650.0000 mg | ORAL_TABLET | Freq: Four times a day (QID) | ORAL | Status: DC | PRN
  Administered 2024-03-16 – 2024-03-23 (×10): 650 mg via ORAL
  Filled 2024-03-14 (×10): qty 2

## 2024-03-14 MED ORDER — TRIMETHOBENZAMIDE HCL 100 MG/ML IM SOLN: 200.0000 mg | Freq: Four times a day (QID) | INTRAMUSCULAR | Status: DC | PRN

## 2024-03-14 MED ORDER — LORAZEPAM 2 MG/ML IJ SOLN
1.0000 mg | INTRAMUSCULAR | Status: DC | PRN
  Administered 2024-03-15 – 2024-03-17 (×3): 1 mg via INTRAVENOUS
  Filled 2024-03-14 (×3): qty 1

## 2024-03-14 MED ORDER — POTASSIUM CHLORIDE 10 MEQ/100ML IV SOLN
10.0000 meq | INTRAVENOUS | Status: AC
  Administered 2024-03-14 (×4): 10 meq via INTRAVENOUS
  Filled 2024-03-14 (×3): qty 100

## 2024-03-14 MED ORDER — POTASSIUM CHLORIDE 10 MEQ/100ML IV SOLN
10.0000 meq | Freq: Once | INTRAVENOUS | Status: AC
  Administered 2024-03-14: 10 meq via INTRAVENOUS
  Filled 2024-03-14: qty 100

## 2024-03-14 MED ORDER — IPRATROPIUM-ALBUTEROL 0.5-2.5 (3) MG/3ML IN SOLN
3.0000 mL | Freq: Four times a day (QID) | RESPIRATORY_TRACT | Status: DC | PRN
  Administered 2024-03-16 – 2024-03-18 (×4): 3 mL via RESPIRATORY_TRACT
  Filled 2024-03-14 (×4): qty 3

## 2024-03-14 MED ORDER — HYDRALAZINE HCL 20 MG/ML IJ SOLN
10.0000 mg | Freq: Four times a day (QID) | INTRAMUSCULAR | Status: DC | PRN
  Administered 2024-03-14 – 2024-03-16 (×3): 10 mg via INTRAVENOUS
  Filled 2024-03-14 (×3): qty 1

## 2024-03-14 MED ORDER — MIDAZOLAM HCL 2 MG/2ML IJ SOLN: 2.0000 mg | INTRAMUSCULAR | Status: DC | PRN

## 2024-03-14 MED ORDER — HALOPERIDOL LACTATE 5 MG/ML IJ SOLN
5.0000 mg | Freq: Once | INTRAMUSCULAR | Status: AC
  Administered 2024-03-14: 5 mg via INTRAMUSCULAR
  Filled 2024-03-14: qty 1

## 2024-03-14 NOTE — H&P (Signed)
 History and Physical    Sherry [name] FMW:[id number] DOB: 1971/01/10 DOA: 03/14/2024  PCP: [name], MD   Patient coming from: Home   Chief Complaint: Confused, combative, cough, SOB   HPI: Sherry [name] is a 53 y.o. female with medical history significant for hypertension, hyperlipidemia, depression, anxiety, and COPD who presents with confusion, agitation, cough, and shortness of breath.  Patient is unable to contribute to the history.  Her significant other reported that she had been experiencing cough and shortness of breath for 3 or 4 days but seem to be improving.  She uses THC on occasion but no other illicit substances.  She is not answering questions in the ED but has been thrashing about in the bed and was attempting to strike ED personnel.  ED Course: Upon arrival to the ED, patient is found to be afebrile and saturating low to mid 90s on room air with tachypnea, tachycardia, and stable blood pressure.  Labs are most notable for potassium 3.1, creatinine 1.04, WBC 28,400, and lactic acid 6.1.  Chest x-ray is negative for acute cardiopulmonary disease. Head CT in negative for acute findings.  Blood cultures were collected and the patient was given 2 L of LR, vancomycin , cefepime , Haldol , Versed , DuoNeb, and IV potassium.  Review of Systems:  Unable to complete ROS due to patient's clinical condition.  Past Medical History:  Diagnosis Date   COPD (chronic obstructive pulmonary disease) (HCC)    Depression    High cholesterol    Hypertension    Lung collapse    left    Past Surgical History:  Procedure Laterality Date   CHEST TUBE INSERTION     COLONOSCOPY WITH PROPOFOL  N/A 03/17/2022   Procedure: COLONOSCOPY WITH PROPOFOL ;  Surgeon: Eartha Angelia Sieving, MD;  Location: [facility];  Service: Gastroenterology;  Laterality: N/A;  930 ASA 2   ENDOMETRIAL ABLATION     POLYPECTOMY  03/17/2022   Procedure: POLYPECTOMY;  Surgeon: Eartha Angelia Sieving, MD;  Location: [facility];  Service: Gastroenterology;;    Social History:   reports that she has been smoking cigarettes. She has a 26.3 pack-year smoking history. She has been exposed to tobacco smoke. She has never used smokeless tobacco. She reports current alcohol use. She reports that she does not use drugs.  Allergies  Allergen Reactions   Dramamine [Dimenhydrinate] Swelling and Other (See Comments)    Made her feel really drunk. Swelling of tongue     Prednisone  Other (See Comments)    Feels like fingers are swelling    Family History  Problem Relation Age of Onset   Colon polyps Mother    Colon cancer Maternal Grandmother    Colon cancer Maternal Grandfather    Colon cancer Paternal Grandmother    Colon cancer Paternal Grandfather    Colon cancer Paternal Uncle      Prior to Admission medications   Medication Sig Start Date End Date Taking? Authorizing Provider  acetaminophen  (TYLENOL ) 500 MG tablet Take 1,000 mg by mouth every 6 (six) hours as needed (pain.).    [provider]  albuterol  (VENTOLIN  HFA) 108 (90 Base) MCG/ACT inhaler Inhale 1-2 puffs into the lungs every 4 (four) hours as needed for shortness of breath or wheezing. 11/02/22   Darlean Ozell NOVAK, MD  aspirin EC 81 MG tablet Take 81 mg by mouth daily. Swallow whole.    [provider]  azithromycin  (ZITHROMAX ) 500 MG tablet Take 1 tablet (500 mg total) by mouth daily.  02/07/23   Evonnie Lenis, MD  cefdinir  (OMNICEF ) 300 MG capsule Take 1 capsule (300 mg total) by mouth every 12 (twelve) hours. 02/07/23   Evonnie Lenis, MD  clonazePAM  (KLONOPIN ) 0.5 MG tablet Take 0.5 mg by mouth in the morning, at noon, and at bedtime.    [provider]  famotidine  (PEPCID ) 20 MG tablet TAKE 1 TABLET BY MOUTH AFTER SUPPER . APPOINTMENT REQUIRED FOR FUTURE REFILLS 10/10/23   Darlean Ozell NOVAK, MD  ferrous sulfate  325 (65 FE) MG tablet Take 1 tablet (325 mg total) by mouth daily with breakfast. 02/08/23   Evonnie Lenis, MD   FLUoxetine (PROZAC) 10 MG capsule Take 10 mg by mouth in the morning.    [provider]  HYDROcodone  bit-homatropine (HYCODAN) 5-1.5 MG/5ML syrup Take 5 mLs by mouth every 4 (four) hours as needed for cough. 02/07/23   Evonnie Lenis, MD  ibuprofen  (ADVIL ) 200 MG tablet Take 600 mg by mouth every 8 (eight) hours as needed for moderate pain (pain.).    [provider]  ipratropium-albuterol  (DUONEB) 0.5-2.5 (3) MG/3ML SOLN Take 3 mLs by nebulization every 8 (eight) hours. 02/07/23   Evonnie Lenis, MD  metoprolol  tartrate (LOPRESSOR ) 50 MG tablet Take 1/2 (one-half) tablet by mouth twice daily **pt must schedule office visit per md** 06/14/23   Nishan, Peter C, MD  pantoprazole  (PROTONIX ) 40 MG tablet Take 40 mg by mouth daily before breakfast.    [provider]  predniSONE  (DELTASONE ) 10 MG tablet Take 6 tablets (60 mg total) by mouth daily with breakfast. And decrease by one tablet daily 02/08/23   Tat, Lenis, MD  QUEtiapine  (SEROQUEL ) 400 MG tablet Take 1 tablet (400 mg total) by mouth at bedtime. Patient taking differently: Take 800 mg by mouth at bedtime. 06/06/18   Idol, Julie, PA-C  sodium chloride  (OCEAN) 0.65 % SOLN nasal spray Place 1 spray into both nostrils as needed for congestion.    [provider]  Tiotropium Bromide-Olodaterol (STIOLTO RESPIMAT ) 2.5-2.5 MCG/ACT AERS INHALE 2 PUFFS BY MOUTH ONCE DAILY 09/18/23   Wert, Michael B, MD  linaclotide  (LINZESS ) 72 MCG capsule Take 1 capsule (72 mcg total) by mouth daily before breakfast. Patient not taking: No sig reported 07/27/19 12/06/20  Marvis Camellia LABOR, NP  metoprolol  succinate (TOPROL -XL) 50 MG 24 hr tablet Take 50 mg by mouth every morning. Take with or immediately following a meal.  12/06/20  [provider]  sertraline  (ZOLOFT ) 100 MG tablet Take 0.5 tablets (50 mg total) by mouth daily. Patient not taking: Reported on 12/03/2020 06/06/18 12/06/20  Birdena Clarity, PA-C    Physical Exam: Vitals:   03/14/24  0231 03/14/24 0252 03/14/24 0400 03/14/24 0530  BP: 105/81  (!) 132/98 (!) 148/89  Pulse: (!) 120  98 97  Resp: 20  (!) 28 (!) 22  Temp:  98.8 F (37.1 C)    TempSrc:  Oral    SpO2: 96%  92% 97%  Weight:      Height:        Constitutional: No apparent respiratory distress. No pallor or diaphoresis.   Eyes: PERTLA, lids and conjunctivae normal ENMT: Mucous membranes are dry. Posterior pharynx clear of any exudate or lesions.   Neck: supple, no masses  Respiratory: no wheezing, no crackles. No accessory muscle use.  Cardiovascular: S1 & S2 heard, regular rate and rhythm. No extremity edema.  Abdomen: No tenderness, soft. Bowel sounds active.  Musculoskeletal: no clubbing / cyanosis. No joint deformity upper and lower  extremities.   Skin: no significant rashes, lesions, ulcers. Warm, dry, well-perfused. Neurologic: CN 2-12 grossly intact. Moving all extremities. Awake but not answering questions. Resists attempts to check deep tendon reflexes and pupillary response.    Labs and Imaging on Admission: I have personally reviewed following labs and imaging studies  CBC: Recent Labs  Lab 03/14/24 0251  WBC 28.4*  NEUTROABS 25.6*  HGB 15.7*  HCT 45.8  MCV 94.2  PLT 469*   Basic Metabolic Panel: Recent Labs  Lab 03/14/24 0251  NA 141  K 3.1*  CL 99  CO2 24  GLUCOSE 154*  BUN 17  CREATININE 1.07*  CALCIUM 9.6   GFR: Estimated Creatinine Clearance: 48.4 mL/min (A) (by C-G formula based on SCr of 1.07 mg/dL (H)). Liver Function Tests: Recent Labs  Lab 03/14/24 0250  AST 39  ALT 19  ALKPHOS 78  BILITOT 0.7  PROT 6.9  ALBUMIN 3.8   No results for input(s): LIPASE, AMYLASE in the last 168 hours. No results for input(s): AMMONIA in the last 168 hours. Coagulation Profile: No results for input(s): INR, PROTIME in the last 168 hours. Cardiac Enzymes: No results for input(s): CKTOTAL, CKMB, CKMBINDEX, TROPONINI in the last 168 hours. BNP (last 3  results) No results for input(s): PROBNP in the last 8760 hours. HbA1C: No results for input(s): HGBA1C in the last 72 hours. CBG: No results for input(s): GLUCAP in the last 168 hours. Lipid Profile: No results for input(s): CHOL, HDL, LDLCALC, TRIG, CHOLHDL, LDLDIRECT in the last 72 hours. Thyroid Function Tests: No results for input(s): TSH, T4TOTAL, FREET4, T3FREE, THYROIDAB in the last 72 hours. Anemia Panel: No results for input(s): VITAMINB12, FOLATE, FERRITIN, TIBC, IRON, RETICCTPCT in the last 72 hours. Urine analysis:    Component Value Date/Time   COLORURINE YELLOW 03/14/2024 0404   APPEARANCEUR CLEAR 03/14/2024 0404   LABSPEC 1.019 03/14/2024 0404   PHURINE 6.0 03/14/2024 0404   GLUCOSEU 50 (A) 03/14/2024 0404   HGBUR MODERATE (A) 03/14/2024 0404   BILIRUBINUR NEGATIVE 03/14/2024 0404   KETONESUR 5 (A) 03/14/2024 0404   PROTEINUR 100 (A) 03/14/2024 0404   NITRITE NEGATIVE 03/14/2024 0404   LEUKOCYTESUR NEGATIVE 03/14/2024 0404   Sepsis Labs: @LABRCNTIP (procalcitonin:4,lacticidven:4) ) Recent Results (from the past 240 hours)  Resp panel by RT-PCR (RSV, Flu A&B, Covid) Anterior Nasal Swab     Status: None   Collection Time: 03/14/24  4:07 AM   Specimen: Anterior Nasal Swab  Result Value Ref Range Status   SARS Coronavirus 2 by RT PCR NEGATIVE NEGATIVE Final    Comment: (NOTE) SARS-CoV-2 target nucleic acids are NOT DETECTED.  The SARS-CoV-2 RNA is generally detectable in upper respiratory specimens during the acute phase of infection. The lowest concentration of SARS-CoV-2 viral copies this assay can detect is 138 copies/mL. A negative result does not preclude SARS-Cov-2 infection and should not be used as the sole basis for treatment or other patient management decisions. A negative result may occur with  improper specimen collection/handling, submission of specimen other than nasopharyngeal swab, presence of viral  mutation(s) within the areas targeted by this assay, and inadequate number of viral copies(<138 copies/mL). A negative result must be combined with clinical observations, patient history, and epidemiological information. The expected result is Negative.  Fact Sheet for Patients:  BloggerCourse.com  Fact Sheet for Healthcare Providers:  SeriousBroker.it  This test is no t yet approved or cleared by the [place]  FDA and  has been authorized for detection and/or diagnosis of  SARS-CoV-2 by FDA under an Emergency Use Authorization (EUA). This EUA will remain  in effect (meaning this test can be used) for the duration of the COVID-19 declaration under Section 564(b)(1) of the Act, 21 U.S.C.section 360bbb-3(b)(1), unless the authorization is terminated  or revoked sooner.       Influenza A by PCR NEGATIVE NEGATIVE Final   Influenza B by PCR NEGATIVE NEGATIVE Final    Comment: (NOTE) The Xpert Xpress SARS-CoV-2/FLU/RSV plus assay is intended as an aid in the diagnosis of influenza from Nasopharyngeal swab specimens and should not be used as a sole basis for treatment. Nasal washings and aspirates are unacceptable for Xpert Xpress SARS-CoV-2/FLU/RSV testing.  Fact Sheet for Patients: BloggerCourse.com  Fact Sheet for Healthcare Providers: SeriousBroker.it  This test is not yet approved or cleared by the [place]  FDA and has been authorized for detection and/or diagnosis of SARS-CoV-2 by FDA under an Emergency Use Authorization (EUA). This EUA will remain in effect (meaning this test can be used) for the duration of the COVID-19 declaration under Section 564(b)(1) of the Act, 21 U.S.C. section 360bbb-3(b)(1), unless the authorization is terminated or revoked.     Resp Syncytial Virus by PCR NEGATIVE NEGATIVE Final    Comment: (NOTE) Fact Sheet for  Patients: BloggerCourse.com  Fact Sheet for Healthcare Providers: SeriousBroker.it  This test is not yet approved or cleared by the [place]  FDA and has been authorized for detection and/or diagnosis of SARS-CoV-2 by FDA under an Emergency Use Authorization (EUA). This EUA will remain in effect (meaning this test can be used) for the duration of the COVID-19 declaration under Section 564(b)(1) of the Act, 21 U.S.C. section 360bbb-3(b)(1), unless the authorization is terminated or revoked.  Performed at [facility], [address], [place], [place] [address]      Radiological Exams on Admission: DG Chest Portable 1 View Result Date: 03/14/2024 EXAM: 1 VIEW XRAY OF THE CHEST 03/14/2024 04:11:00 AM COMPARISON: [facility] radiograph of the chest dated 02/04/2023. CLINICAL HISTORY: Sob. Altered mental status, restlessness FINDINGS: LUNGS AND PLEURA: Coarse interstitial opacities again demonstrated diffusely, which appear chronic. No focal pulmonary opacity. No pulmonary edema. No pleural effusion. No pneumothorax. HEART AND MEDIASTINUM: No acute abnormality of the cardiac and mediastinal silhouettes. BONES AND SOFT TISSUES: No acute osseous abnormality. IMPRESSION: 1. No acute findings. 2. Coarse interstitial opacities diffusely, appearing chronic. Electronically signed by: Evalene Coho MD 03/14/2024 04:44 AM EDT RP Workstation: [id number]    EKG: Independently reviewed. Sinus rhythm, PACs, non-specific IVCD with LAD.   Assessment/Plan   1. Acute encephalopathy  - There is no acute findings on head CT and no hypercarbia on blood gas in the ED  - Check ammonia, TSH, RPR, and B12, use delirium precautions, treat possible infection    2. SIRS  - Leukocytosis, tachypnea, and tachycardia present on admission  - Abdomen is soft and no-tender, there is no conspicuous pneumonia on CXR, no bacteriuria or pyuria, no meningismus, no apparent  cellulitis  - She was cultured in ED and given broad spectrum antibiotics  - Continue broad-spectrum antibiotics for now, trend lactate, follow cultures and clinical course   3. AKI  - She was fluid-resuscitated in the ED, will repeat chem panel    4. COPD - Not in exacerbation  - Continue short-acting bronchodilators as-needed   5. Hypokalemia  - Replacing    6. Depression, anxiety  - Hold psychoactive medications for now    7. Hypertension  - Treat as-needed only for now  DVT prophylaxis: Lovenox   Code Status: Full  Level of Care: Level of care:  Family Communication: Significant other updated in ED  Disposition Plan:  Patient is from: Home  Anticipated d/c is to: TBD Anticipated d/c date is: TBD Patient currently: Pending improved mental status, clinical course Consults called: None  Admission status: Inpatient     Evalene GORMAN Sprinkles, MD [facility]  03/14/2024, 5:49 AM

## 2024-03-14 NOTE — ED Notes (Signed)
 Pt unable to sit still during CT, EDP made aware

## 2024-03-14 NOTE — Progress Notes (Signed)
 Pharmacy Antibiotic Note  Sherry [name] is a 53 y.o. female admitted on 03/14/2024 with sepsis.  Pharmacy has been consulted for vancomycin  and cefepime  dosing.  Pt appears to have AKI but most recent labs in Epic >1y old so picture unclear.  Plan: Rec'd vanc 1g in ED as load; hold off on further dosing until renal function more clear. Cefepime  2g IV Q12H; may need to change if SCr changes.  Height: 5' 5 (165.1 cm) Weight: 51 kg (112 lb 7 oz) IBW/kg (Calculated) : 57  Temp (24hrs), Avg:98.8 F (37.1 C), Min:98.8 F (37.1 C), Max:98.8 F (37.1 C)  Recent Labs  Lab 03/14/24 0250 03/14/24 0251  WBC  --  28.4*  CREATININE  --  1.07*  LATICACIDVEN 6.1*  --     Estimated Creatinine Clearance: 48.4 mL/min (A) (by C-G formula based on SCr of 1.07 mg/dL (H)).    Allergies  Allergen Reactions   Dramamine [Dimenhydrinate] Swelling and Other (See Comments)    Made her feel really drunk. Swelling of tongue     Prednisone  Other (See Comments)    Feels like fingers are swelling    Thank you for allowing pharmacy to be a part of this patient's care.  Marvetta Dauphin, PharmD, BCPS  03/14/2024 6:43 AM

## 2024-03-14 NOTE — ED Notes (Signed)
 Patient transported to CT

## 2024-03-14 NOTE — ED Notes (Signed)
 Pts significant other taken to family room to speak with provider

## 2024-03-14 NOTE — ED Notes (Signed)
 Admitting  at bedside.

## 2024-03-14 NOTE — Progress Notes (Signed)
 PROGRESS NOTE   Sherry [name], is a 53 y.o. female, DOB - [birth year], FMW:[id number]  Admit date - 03/14/2024   Admitting Physician Everley Evora Pearlean, MD  Outpatient Primary MD for the patient is [name], MD  LOS - 0  Chief Complaint  Patient presents with   Altered Mental Status       Brief Narrative:   53 y.o. female with medical history significant for hypertension, hyperlipidemia, depression, anxiety, and COPD who presents with confusion, agitation, cough, and shortness of breath admitted on 03/13/2024 with acute encephalopathy in the setting of recent episodes of emesis and diarrhea    -Assessment and Plan: 1) Acute encephalopathy --??  Metabolic in the setting of dehydration and electrolyte derangement - -CT head without acute findings -MRI brain pending Serum ammonia WNL  -No evidence of acute infection -UDS with benzos and THC - TSH and B12 WNL -PTA patient was on benzos UDS confirm please avoid abrupt discontinuation of benzos -Patient very confused and-requiring one-to-one safety monitoring   2. SIRS dehydration with lactic acidosis - Leukocytosis, tachypnea, and tachycardia present on admission  - Abdomen is soft and no-tender, there is no conspicuous pneumonia on CXR, no bacteriuria or pyuria, no meningismus, no apparent cellulitis  - She was cultured in ED and given broad spectrum antibiotics  - Continue broad-spectrum antibiotics for now, trend lactate, follow cultures and clinical course    3. AKI  - Due to GI losses patient had vomiting and diarrhea prior to admission --Hydrate   4.  Recurrent emesis and diarrhea--- check abdominal x-ray -Send stool for C. difficile and GI pathogen if able  5. Hypokalemia  - Replacing     6. Depression, anxiety  - Hold psychoactive medications for now     7. Hypertension  - Treat as-needed only for now   8)COPD - Not in exacerbation  - Continue short-acting bronchodilators as-needed   Status is: Inpatient    Disposition: The patient is from: Home              Anticipated d/c is to: Home              Anticipated d/c date is: 2 days              Patient currently is not medically stable to d/c. Barriers: Not Clinically Stable-   Code Status :  -  Code Status: Full Code   Family Communication:    Discussed with boyfriend of 8 years at bedside  DVT Prophylaxis  :   - SCDs   enoxaparin  (LOVENOX ) injection 40 mg Start: 03/14/24 2200   Lab Results  Component Value Date   PLT 469 (H) 03/14/2024    Inpatient Medications  Scheduled Meds:  enoxaparin  (LOVENOX ) injection  40 mg Subcutaneous Q24H   pantoprazole   40 mg Oral QAC breakfast   sodium chloride  flush  3 mL Intravenous Q12H   Continuous Infusions:  ceFEPime  (MAXIPIME ) IV 2 g (03/14/24 1526)   dextrose  5 % and 0.45 % NaCl with KCl 20 mEq/L 83 mL/hr at 03/14/24 1550   metronidazole  Stopped (03/14/24 0859)   potassium chloride  10 mEq (03/14/24 1806)   [START ON 03/15/2024] vancomycin      PRN Meds:.acetaminophen  **OR** acetaminophen , hydrALAZINE , ipratropium-albuterol , LORazepam , trimethobenzamide    Anti-infectives (From admission, onward)    Start     Dose/Rate Route Frequency Ordered Stop   03/15/24 0800  vancomycin  (VANCOCIN ) IVPB 1000 mg/200 mL premix        1,000 mg 200 mL/hr  over 60 Minutes Intravenous Every 24 hours 03/14/24 1107     03/14/24 1600  ceFEPIme  (MAXIPIME ) 2 g in sodium chloride  0.9 % 100 mL IVPB        2 g 200 mL/hr over 30 Minutes Intravenous Every 12 hours 03/14/24 0647     03/14/24 0800  metroNIDAZOLE  (FLAGYL ) IVPB 500 mg        500 mg 100 mL/hr over 60 Minutes Intravenous Every 12 hours 03/14/24 0639 03/21/24 0759   03/14/24 0647  vancomycin  variable dose per unstable renal function (pharmacist dosing)  Status:  Discontinued         Does not apply See admin instructions 03/14/24 0647 03/14/24 1107   03/14/24 0415  vancomycin  (VANCOCIN ) IVPB 1000 mg/200 mL premix        1,000 mg 200 mL/hr over 60  Minutes Intravenous  Once 03/14/24 0405 03/14/24 0607   03/14/24 0415  ceFEPIme  (MAXIPIME ) 2 g in sodium chloride  0.9 % 100 mL IVPB        2 g 200 mL/hr over 30 Minutes Intravenous  Once 03/14/24 0408 03/14/24 0501         Subjective: Judee Feliz today has no fevers, no emesis,  No chest pain,    - Friend states that they both had GI symptoms including vomiting and diarrhea for the last couple days prior to presenting  Discussed with boyfriend of 8 years at bedside   Objective: Vitals:   03/14/24 1000 03/14/24 1055 03/14/24 1500 03/14/24 1530  BP: (!) 159/85 (!) 159/90 (!) 178/87 (!) 179/90  Pulse: 86 88 82 78  Resp: 14 19 15 15   Temp:  98.4 F (36.9 C) 98 F (36.7 C) 97.6 F (36.4 C)  TempSrc:  Oral Axillary Oral  SpO2: 99% 93% 97% 97%  Weight:      Height:        Intake/Output Summary (Last 24 hours) at 03/14/2024 1838 Last data filed at 03/14/2024 1501 Gross per 24 hour  Intake 958.06 ml  Output --  Net 958.06 ml   Filed Weights   03/14/24 0227  Weight: 51 kg    Physical Exam  Gen:-Sleepy but arousable HEENT:- [place].AT, No sclera icterus Neck-Supple Neck,No JVD,.  Lungs-  CTAB , fair symmetrical air movement CV- S1, S2 normal, regular  Abd-  +ve B.Sounds, Abd Soft, No tenderness,    Extremity/Skin:- No  edema, pedal pulses present  Psych-affect is odd, not really communicative,  neuro-limited exam , spontaneously moves extremities but will not follow instructions  Data Reviewed: I have personally reviewed following labs and imaging studies  CBC: Recent Labs  Lab 03/14/24 0251  WBC 28.4*  NEUTROABS 25.6*  HGB 15.7*  HCT 45.8  MCV 94.2  PLT 469*   Basic Metabolic Panel: Recent Labs  Lab 03/14/24 0251  NA 141  K 3.1*  CL 99  CO2 24  GLUCOSE 154*  BUN 17  CREATININE 1.07*  CALCIUM 9.6  MG 2.0  PHOS 3.4   GFR: Estimated Creatinine Clearance: 48.4 mL/min (A) (by C-G formula based on SCr of 1.07 mg/dL (H)). Liver Function Tests: Recent Labs   Lab 03/14/24 0250  AST 39  ALT 19  ALKPHOS 78  BILITOT 0.7  PROT 6.9  ALBUMIN 3.8   Recent Results (from the past 240 hours)  Resp panel by RT-PCR (RSV, Flu A&B, Covid) Anterior Nasal Swab     Status: None   Collection Time: 03/14/24  4:07 AM   Specimen: Anterior Nasal Swab  Result Value Ref Range Status   SARS Coronavirus 2 by RT PCR NEGATIVE NEGATIVE Final    Comment: (NOTE) SARS-CoV-2 target nucleic acids are NOT DETECTED.  The SARS-CoV-2 RNA is generally detectable in upper respiratory specimens during the acute phase of infection. The lowest concentration of SARS-CoV-2 viral copies this assay can detect is 138 copies/mL. A negative result does not preclude SARS-Cov-2 infection and should not be used as the sole basis for treatment or other patient management decisions. A negative result may occur with  improper specimen collection/handling, submission of specimen other than nasopharyngeal swab, presence of viral mutation(s) within the areas targeted by this assay, and inadequate number of viral copies(<138 copies/mL). A negative result must be combined with clinical observations, patient history, and epidemiological information. The expected result is Negative.  Fact Sheet for Patients:  BloggerCourse.com  Fact Sheet for Healthcare Providers:  SeriousBroker.it  This test is no t yet approved or cleared by the [place]  FDA and  has been authorized for detection and/or diagnosis of SARS-CoV-2 by FDA under an Emergency Use Authorization (EUA). This EUA will remain  in effect (meaning this test can be used) for the duration of the COVID-19 declaration under Section 564(b)(1) of the Act, 21 U.S.C.section 360bbb-3(b)(1), unless the authorization is terminated  or revoked sooner.       Influenza A by PCR NEGATIVE NEGATIVE Final   Influenza B by PCR NEGATIVE NEGATIVE Final    Comment: (NOTE) The Xpert Xpress  SARS-CoV-2/FLU/RSV plus assay is intended as an aid in the diagnosis of influenza from Nasopharyngeal swab specimens and should not be used as a sole basis for treatment. Nasal washings and aspirates are unacceptable for Xpert Xpress SARS-CoV-2/FLU/RSV testing.  Fact Sheet for Patients: BloggerCourse.com  Fact Sheet for Healthcare Providers: SeriousBroker.it  This test is not yet approved or cleared by the [place]  FDA and has been authorized for detection and/or diagnosis of SARS-CoV-2 by FDA under an Emergency Use Authorization (EUA). This EUA will remain in effect (meaning this test can be used) for the duration of the COVID-19 declaration under Section 564(b)(1) of the Act, 21 U.S.C. section 360bbb-3(b)(1), unless the authorization is terminated or revoked.     Resp Syncytial Virus by PCR NEGATIVE NEGATIVE Final    Comment: (NOTE) Fact Sheet for Patients: BloggerCourse.com  Fact Sheet for Healthcare Providers: SeriousBroker.it  This test is not yet approved or cleared by the [place]  FDA and has been authorized for detection and/or diagnosis of SARS-CoV-2 by FDA under an Emergency Use Authorization (EUA). This EUA will remain in effect (meaning this test can be used) for the duration of the COVID-19 declaration under Section 564(b)(1) of the Act, 21 U.S.C. section 360bbb-3(b)(1), unless the authorization is terminated or revoked.  Performed at [facility], [address], [place], [place] [address]     Radiology Studies: CT HEAD WO CONTRAST ( ) Result Date: 03/14/2024 EXAM: CT HEAD WITHOUT CONTRAST 03/14/2024 06:30:56 AM TECHNIQUE: CT of the head was performed without the administration of intravenous contrast. Automated exposure control, iterative reconstruction, and/or weight based adjustment of the mA/kV was utilized to reduce the radiation dose to as low as  reasonably achievable. COMPARISON: CT of the head dated 11/19/2022. CLINICAL HISTORY: Headache, new onset (Age >= 51y). Ems called out for unresponsive pt. Per ems pt has been combative and boyfriend states she is not normally like this. FINDINGS: BRAIN AND VENTRICLES: No acute hemorrhage. No evidence of acute infarct. No hydrocephalus. No extra-axial collection. No mass  effect or midline shift. ORBITS: No acute abnormality. SINUSES: No acute abnormality. SOFT TISSUES AND SKULL: No acute soft tissue abnormality. No skull fracture. IMPRESSION: 1. No acute intracranial abnormality. Electronically signed by: Evalene Coho MD 03/14/2024 06:35 AM EDT RP Workstation: [id number]   DG Chest Portable 1 View Result Date: 03/14/2024 EXAM: 1 VIEW XRAY OF THE CHEST 03/14/2024 04:11:00 AM COMPARISON: AP radiograph of the chest dated 02/04/2023. CLINICAL HISTORY: Sob. Altered mental status, restlessness FINDINGS: LUNGS AND PLEURA: Coarse interstitial opacities again demonstrated diffusely, which appear chronic. No focal pulmonary opacity. No pulmonary edema. No pleural effusion. No pneumothorax. HEART AND MEDIASTINUM: No acute abnormality of the cardiac and mediastinal silhouettes. BONES AND SOFT TISSUES: No acute osseous abnormality. IMPRESSION: 1. No acute findings. 2. Coarse interstitial opacities diffusely, appearing chronic. Electronically signed by: Evalene Coho MD 03/14/2024 04:44 AM EDT RP Workstation: [id number]   Scheduled Meds:  enoxaparin  (LOVENOX ) injection  40 mg Subcutaneous Q24H   pantoprazole   40 mg Oral QAC breakfast   sodium chloride  flush  3 mL Intravenous Q12H   Continuous Infusions:  ceFEPime  (MAXIPIME ) IV 2 g (03/14/24 1526)   dextrose  5 % and 0.45 % NaCl with KCl 20 mEq/L 83 mL/hr at 03/14/24 1550   metronidazole  Stopped (03/14/24 0859)   potassium chloride  10 mEq (03/14/24 1806)   [START ON 03/15/2024] vancomycin       LOS: 0 days   Rendall Carwin M.D on 03/14/2024 at 6:38 PM  Go to  [email or web address] - for contact info  [facility] - Office  [phone]  If 7PM-7AM, please contact night-coverage [email or web address] 03/14/2024, 6:38 PM

## 2024-03-14 NOTE — BH Assessment (Signed)
 Patient was deferred to IRIS for a telepsych assessment. The assigned care coordinator will provide updates regarding the scheduling of the assessment. IRIS coordinator can be reached at [phone] for further information on the timing of the telepsych evaluation.

## 2024-03-14 NOTE — Plan of Care (Signed)
  Problem: Clinical Measurements: Goal: Respiratory complications will improve Outcome: Progressing Goal: Cardiovascular complication will be avoided Outcome: Progressing   Problem: Activity: Goal: Risk for activity intolerance will decrease Outcome: Not Progressing   Problem: Nutrition: Goal: Adequate nutrition will be maintained Outcome: Not Progressing   Problem: Coping: Goal: Level of anxiety will decrease Outcome: Progressing   Problem: Pain Managment: Goal: General experience of comfort will improve and/or be controlled Outcome: Progressing   Problem: Safety: Goal: Ability to remain free from injury will improve Outcome: Progressing

## 2024-03-14 NOTE — ED Provider Notes (Signed)
 [place] EMERGENCY DEPARTMENT AT [facility] Provider Note   CSN: [id number] Arrival date & time: 03/14/24  0229     History Chief Complaint  Patient presents with   Altered Mental Status    HPI Sherry [name] is a 53 y.o. female presenting for chief complaint of aggressive behavior.  Called out by EMS by family for agitation.  Suspected drug use per EMS with paraphernalia identified. On arrival, patient not answering questions, thrashing in the bed, attempting to strike out at care team.  Appears to be responding to internal stimuli. Moving all extremities equally.   Patient's recorded medical, surgical, social, medication list and allergies were reviewed in the Snapshot window as part of the initial history.   Review of Systems   Review of Systems  Unable to perform ROS: Psychiatric disorder    Physical Exam Updated Vital Signs BP (!) 158/88   Pulse 87   Temp 98.8 F (37.1 C) (Oral)   Resp 18   Ht 5' 5 (1.651 m)   Wt 51 kg   SpO2 99%   BMI 18.71 kg/m  Physical Exam Vitals and nursing note reviewed.  Constitutional:      General: She is not in acute distress.    Appearance: She is well-developed.  HENT:     Head: Normocephalic and atraumatic.  Eyes:     Conjunctiva/sclera: Conjunctivae normal.  Cardiovascular:     Rate and Rhythm: Normal rate and regular rhythm.     Heart sounds: No murmur heard. Pulmonary:     Effort: Pulmonary effort is normal. No respiratory distress.     Breath sounds: Normal breath sounds.  Abdominal:     General: There is no distension.     Palpations: Abdomen is soft.     Tenderness: There is no abdominal tenderness. There is no right CVA tenderness or left CVA tenderness.  Musculoskeletal:        General: No swelling or tenderness. Normal range of motion.     Cervical back: Neck supple.  Skin:    General: Skin is warm and dry.  Neurological:     General: No focal deficit present.     Mental Status: She is alert. Mental  status is at baseline. She is disoriented.     Cranial Nerves: No cranial nerve deficit.      ED Course/ Medical Decision Making/ A&P Clinical Course as of 03/14/24 0640  Wed Mar 14, 2024  0236 IVC and first completed [CC]  0539 I was just informed that patient was never taken for head CT because of concern that patient might move during the CT scan.  I evaluated patient at bedside, laying still at this time.  No indication for further sedation as patient is no longer aggressive. [CC]    Clinical Course User Index [CC] Jerral Meth, MD    Procedures .Critical Care  Performed by: Jerral Meth, MD Authorized by: Jerral Meth, MD   Critical care provider statement:    Critical care time (minutes):  95   Critical care was necessary to treat or prevent imminent or life-threatening deterioration of the following conditions:  Sepsis and CNS failure or compromise   Critical care was time spent personally by me on the following activities:  Development of treatment plan with patient or surrogate, discussions with consultants, evaluation of patient's response to treatment, examination of patient, ordering and review of laboratory studies, ordering and review of radiographic studies, ordering and performing treatments and interventions, pulse oximetry, re-evaluation  of patient's condition and review of old charts   Care discussed with: admitting provider      Medications Ordered in ED Medications  enoxaparin  (LOVENOX ) injection 40 mg (has no administration in time range)  sodium chloride  flush (NS) 0.9 % injection 3 mL (has no administration in time range)  acetaminophen  (TYLENOL ) tablet 650 mg (has no administration in time range)    Or  acetaminophen  (TYLENOL ) suppository 650 mg (has no administration in time range)  0.9 %  sodium chloride  infusion (has no administration in time range)  trimethobenzamide  (TIGAN ) injection 200 mg (has no administration in time range)   midazolam  PF (VERSED ) injection 2 mg (has no administration in time range)  metroNIDAZOLE  (FLAGYL ) IVPB 500 mg (has no administration in time range)  potassium chloride  10 mEq in 100 mL IVPB (has no administration in time range)  haloperidol  lactate (HALDOL ) injection 5 mg (5 mg Intramuscular Given 03/14/24 0232)  midazolam  (VERSED ) injection 2 mg (2 mg Intramuscular Given 03/14/24 0232)  potassium chloride  10 mEq in 100 mL IVPB (0 mEq Intravenous Stopped 03/14/24 0504)  lactated ringers  bolus 1,000 mL (0 mLs Intravenous Stopped 03/14/24 0534)  lactated ringers  bolus 1,000 mL (1,000 mLs Intravenous New Bag/Given 03/14/24 0432)  vancomycin  (VANCOCIN ) IVPB 1000 mg/200 mL premix (0 mg Intravenous Stopped 03/14/24 0607)  ceFEPIme  (MAXIPIME ) 2 g in sodium chloride  0.9 % 100 mL IVPB (0 g Intravenous Stopped 03/14/24 0501)  ipratropium-albuterol  (DUONEB) 0.5-2.5 (3) MG/3ML nebulizer solution 3 mL (3 mLs Nebulization Given 03/14/24 0556)  midazolam  (VERSED ) injection 2 mg (2 mg Intravenous Given 03/14/24 0602)   Medical Decision Making:   Aune Adami is a 53 y.o. female who presented to the ED today with altered mental status detailed above.    Patient placed on continuous vitals and telemetry monitoring while in ED which was reviewed periodically.  Complete initial physical exam performed, notably the patient  was agitated and delirious.  Most consistent with sympathomimetic presentation given tachycardia and delirium.    Reviewed and confirmed nursing documentation for past medical history, family history, social history.    Initial Assessment:   With the patient's presentation of altered mental status, most likely diagnosis is delerium secondary to substance use disorder versus 2/2 infectious etiology (UTI/CAP/URI) vs metabolic abnormality (Na/K/Mg/Ca) vs nonspecific etiology. Other diagnoses were considered including (but not limited to) CVA, ICH, intracranial mass, critical dehydration, heptatic dysfunction, uremia,  hypercarbia, intoxication, endrocrine abnormality, toxidrome. These are considered less likely due to history of present illness and physical exam findings.   This is most consistent with an acute life/limb threatening illness complicated by underlying chronic conditions.  Initial Plan:  CTH to evaluate for intracranial etiology of patient's symptoms  Screening labs including CBC and Metabolic panel to evaluate for infectious or metabolic etiology of disease.  Urinalysis with reflex culture ordered to evaluate for UTI or relevant urologic/nephrologic pathology.  CXR to evaluate for structural/infectious intrathoracic pathology.  Drug screen for toxidrome evaluation VBG for acid/base status and further toxidrome evaulation EKG to evaluate for cardiac pathology Objective evaluation as below reviewed   Initial Study Results:   Laboratory  Substantial leukocytosis.  Given initial tachycardia, this triggered a sepsis evaluation.  Patient evaluated for sepsis.  Blood cultures, IV fluid, antibiotics all ordered.  EKG EKG was reviewed independently. Rate, rhythm, axis, intervals all examined and without medically relevant abnormality. ST segments without concerns for elevations.    Radiology:  All images reviewed independently. Agree with radiology report at this time.  CT HEAD WO CONTRAST ( ) Result Date: 03/14/2024 EXAM: CT HEAD WITHOUT CONTRAST 03/14/2024 06:30:56 AM TECHNIQUE: CT of the head was performed without the administration of intravenous contrast. Automated exposure control, iterative reconstruction, and/or weight based adjustment of the mA/kV was utilized to reduce the radiation dose to as low as reasonably achievable. COMPARISON: CT of the head dated 11/19/2022. CLINICAL HISTORY: Headache, new onset (Age >= 51y). Ems called out for unresponsive pt. Per ems pt has been combative and boyfriend states she is not normally like this. FINDINGS: BRAIN AND VENTRICLES: No acute hemorrhage. No  evidence of acute infarct. No hydrocephalus. No extra-axial collection. No mass effect or midline shift. ORBITS: No acute abnormality. SINUSES: No acute abnormality. SOFT TISSUES AND SKULL: No acute soft tissue abnormality. No skull fracture. IMPRESSION: 1. No acute intracranial abnormality. Electronically signed by: Evalene Coho MD 03/14/2024 06:35 AM EDT RP Workstation: [id number]   DG Chest Portable 1 View Result Date: 03/14/2024 EXAM: 1 VIEW XRAY OF THE CHEST 03/14/2024 04:11:00 AM COMPARISON: AP radiograph of the chest dated 02/04/2023. CLINICAL HISTORY: Sob. Altered mental status, restlessness FINDINGS: LUNGS AND PLEURA: Coarse interstitial opacities again demonstrated diffusely, which appear chronic. No focal pulmonary opacity. No pulmonary edema. No pleural effusion. No pneumothorax. HEART AND MEDIASTINUM: No acute abnormality of the cardiac and mediastinal silhouettes. BONES AND SOFT TISSUES: No acute osseous abnormality. IMPRESSION: 1. No acute findings. 2. Coarse interstitial opacities diffusely, appearing chronic. Electronically signed by: Evalene Coho MD 03/14/2024 04:44 AM EDT RP Workstation: [id number]      Consults: Case discussed with hospitalist.   Final Assessment and Plan:   Patient remained grossly encephalopathic while in the ER.  Required 2 rounds of agitation medications to tolerate laying still for CT scan.  X-ray shows ongoing interstitial markings similar to last presentation a year ago which was from substantial pneumonia and pneumosepsis.  Initiated treatment as above for pneumonia and sepsis.  Lactic acid grossly elevated at 6.1.  Patient aggressively rehydrated.  Serial lactic's pending. Discussed at bedside with family.  Patient arranged for admission for further diagnostic care and management.    Clinical Impression:  1. Sepsis, due to unspecified organism, unspecified whether acute organ dysfunction present [facility])      Admit   Final Clinical Impression(s)  / ED Diagnoses Final diagnoses:  Sepsis, due to unspecified organism, unspecified whether acute organ dysfunction present [facility])    Rx / DC Orders ED Discharge Orders     None         Jerral Meth, MD 03/14/24 [phone]

## 2024-03-14 NOTE — Progress Notes (Signed)
 ED Pharmacy Antibiotic Sign Off An antibiotic consult was received from an ED provider for cefepime  per pharmacy dosing for sepsis. A chart review was completed to assess appropriateness.   The following one time order(s) were placed:   -Cefepime  2g IV x1  Further antibiotic and/or antibiotic pharmacy consults should be ordered by the admitting provider if indicated.   Thank you for allowing pharmacy to be a part of this patient's care.   Lynwood Poplar, [facility]  Clinical Pharmacist 03/14/24 4:07 AM

## 2024-03-14 NOTE — ED Notes (Signed)
 Pt suddenly sat up and began vomiting. After vomiting, she laid back down into the bed, without saying anything or responding to verbal.

## 2024-03-14 NOTE — Evaluation (Signed)
 Clinical/Bedside Swallow Evaluation Patient Details  Name: Sherry [name] MRN: [id number] Date of Birth: 01-13-1971  Today's Date: 03/14/2024 Time: SLP Start Time (ACUTE ONLY): 1504 SLP Stop Time (ACUTE ONLY): 1522 SLP Time Calculation (min) (ACUTE ONLY): 18 min  Past Medical History:  Past Medical History:  Diagnosis Date   COPD (chronic obstructive pulmonary disease) (HCC)    Depression    High cholesterol    Hypertension    Lung collapse    left   Past Surgical History:  Past Surgical History:  Procedure Laterality Date   CHEST TUBE INSERTION     COLONOSCOPY WITH PROPOFOL  N/A 03/17/2022   Procedure: COLONOSCOPY WITH PROPOFOL ;  Surgeon: Sherry Angelia Sieving, MD;  [facility]: [facility];  Service: Gastroenterology;  Laterality: N/A;  930 ASA 2   ENDOMETRIAL ABLATION     POLYPECTOMY  03/17/2022   Procedure: POLYPECTOMY;  Surgeon: Sherry Angelia Sieving, MD;  Location: [facility];  Service: Gastroenterology;;   HPI:  Sherry [name] is a 53 y.o. female presenting for chief complaint of aggressive behavior. Pt with medical history significant for hypertension, hyperlipidemia, depression, anxiety, and COPD who presents with confusion, agitation, cough, and shortness of breath. Pt has been not responsive or following commands; BSE requested.    Assessment / Plan / Recommendation  Clinical Impression  Clinical swallowing evaluation complete while Pt was sitting upright in bed; Pt was repositioned by SLP and NT/Sitter present at bedside. Pt Presents with primary cognitive based dysphagia; Pt would not/could not follow any commands and only opened her eyes with a flutter type pattern. No vocalization noted. Pt seemed unable to visually focus on anything. SLP attempted to present single ice chips and Pt immediately pushed her lips closed and turned her head away. Attempted to present cup sips and pt again pursed her lips and turned away. Also attempted 1/2 tsp of puree via spoon and Pt again  turned away. Pt is unfortunately not safe/appropriate for PO at this time. Recommend NPO. ST will re-evaluate tomorrow to determine PO readiness. Thank you, SLP Visit Diagnosis: Dysphagia, unspecified (R13.10)    Aspiration Risk  Mild aspiration risk;Moderate aspiration risk    Diet Recommendation    NPO  Medication Administration: Via alternative means    Other  Recommendations Oral Care Recommendations: Oral care QID           Frequency and Duration min 1 x/week  1 week       Prognosis Prognosis for improved oropharyngeal function: Fair Barriers to Reach Goals: Cognitive deficits      Swallow Study   General HPI: [name] is a 53 y.o. female presenting for chief complaint of aggressive behavior. Pt with medical history significant for hypertension, hyperlipidemia, depression, anxiety, and COPD who presents with confusion, agitation, cough, and shortness of breath. Pt has been not responsive or following commands; BSE requested. Type of Study: Bedside Swallow Evaluation Previous Swallow Assessment: none in chart Diet Prior to this Study: Regular;Thin liquids (Level 0) Temperature Spikes Noted: No Respiratory Status: Room air History of Recent Intubation: No Behavior/Cognition: Doesn't follow directions;Lethargic/Drowsy Oral Cavity Assessment: Dry;Other (comment) (difficult to assess) Oral Cavity - Dentition: Edentulous Patient Positioning: Upright in bed Baseline Vocal Quality: Not observed Volitional Cough: Congested Volitional Swallow: Unable to elicit    Oral/Motor/Sensory Function Overall Oral Motor/Sensory Function: Within functional limits   Ice Chips Ice chips: Not tested   Thin Liquid Thin Liquid: Impaired Presentation: Cup    Nectar Thick Nectar Thick Liquid: Not tested  Honey Thick Honey Thick Liquid: Not tested   Puree Puree: Impaired   Solid     Solid: Not tested     Destin Kittler H. Sherry [name], CCC-SLP Speech Language Pathologist  Sherry [name] 03/14/2024,3:55 PM

## 2024-03-14 NOTE — ED Triage Notes (Signed)
 Ems called out for unresponsive pt. Per ems pt has been combative and boyfriend states she is not normally like this.

## 2024-03-14 NOTE — TOC CM/SW Note (Signed)
 Transition of Care [facility]) - Inpatient Brief Assessment   Patient Details  Name: Sherry [name] MRN: [id number] Date of Birth: Jan 10, 1971  Transition of Care [facility]) CM/SW Contact:    Lucie Lunger, LCSWA Phone Number: 03/14/2024, 9:12 AM   Clinical Narrative: Transition of Care Department [facility]) has reviewed patient and no TOC needs have been identified at this time. We will continue to monitor patient advancement through interdiciplinary progression rounds. If new patient transition needs arise, please place a TOC consult.  Transition of Care Asessment: Insurance and Status: Selfpay Patient has primary care physician: Yes Home environment has been reviewed: From home Prior level of function:: Independent Prior/Current Home Services: No current home services Social Drivers of Health Review: SDOH reviewed no interventions necessary Readmission risk has been reviewed: Yes Transition of care needs: no transition of care needs at this time

## 2024-03-14 NOTE — ED Notes (Signed)
 Pt attempting to get out of bed, requiring constant redirection. EDP made aware

## 2024-03-15 ENCOUNTER — Encounter (HOSPITAL_COMMUNITY): Payer: Self-pay | Admitting: Family Medicine

## 2024-03-15 ENCOUNTER — Inpatient Hospital Stay (HOSPITAL_COMMUNITY): Payer: Self-pay

## 2024-03-15 DIAGNOSIS — R569 Unspecified convulsions: Secondary | ICD-10-CM | POA: Diagnosis not present

## 2024-03-15 DIAGNOSIS — Z1152 Encounter for screening for COVID-19: Secondary | ICD-10-CM | POA: Diagnosis not present

## 2024-03-15 DIAGNOSIS — G009 Bacterial meningitis, unspecified: Secondary | ICD-10-CM | POA: Diagnosis not present

## 2024-03-15 DIAGNOSIS — I10 Essential (primary) hypertension: Secondary | ICD-10-CM | POA: Diagnosis not present

## 2024-03-15 DIAGNOSIS — R4182 Altered mental status, unspecified: Secondary | ICD-10-CM | POA: Diagnosis not present

## 2024-03-15 DIAGNOSIS — A419 Sepsis, unspecified organism: Secondary | ICD-10-CM | POA: Diagnosis not present

## 2024-03-15 DIAGNOSIS — J449 Chronic obstructive pulmonary disease, unspecified: Secondary | ICD-10-CM | POA: Diagnosis not present

## 2024-03-15 DIAGNOSIS — G0481 Other encephalitis and encephalomyelitis: Secondary | ICD-10-CM | POA: Diagnosis not present

## 2024-03-15 DIAGNOSIS — D72829 Elevated white blood cell count, unspecified: Secondary | ICD-10-CM | POA: Diagnosis not present

## 2024-03-15 DIAGNOSIS — E872 Acidosis, unspecified: Secondary | ICD-10-CM | POA: Diagnosis not present

## 2024-03-15 DIAGNOSIS — R6520 Severe sepsis without septic shock: Secondary | ICD-10-CM | POA: Diagnosis not present

## 2024-03-15 DIAGNOSIS — G9341 Metabolic encephalopathy: Secondary | ICD-10-CM | POA: Diagnosis not present

## 2024-03-15 DIAGNOSIS — G039 Meningitis, unspecified: Secondary | ICD-10-CM | POA: Diagnosis not present

## 2024-03-15 DIAGNOSIS — R509 Fever, unspecified: Secondary | ICD-10-CM

## 2024-03-15 DIAGNOSIS — I6783 Posterior reversible encephalopathy syndrome: Secondary | ICD-10-CM | POA: Diagnosis not present

## 2024-03-15 DIAGNOSIS — N179 Acute kidney failure, unspecified: Secondary | ICD-10-CM | POA: Diagnosis not present

## 2024-03-15 DIAGNOSIS — G9340 Encephalopathy, unspecified: Secondary | ICD-10-CM | POA: Diagnosis not present

## 2024-03-15 DIAGNOSIS — F32A Depression, unspecified: Secondary | ICD-10-CM | POA: Diagnosis not present

## 2024-03-15 LAB — BASIC METABOLIC PANEL WITH GFR
Anion gap: 10 (ref 5–15)
Anion gap: 13 (ref 5–15)
BUN: 13 mg/dL (ref 6–20)
BUN: 12 mg/dL (ref 6–20)
CO2: 20 mmol/L — ABNORMAL LOW (ref 22–32)
CO2: 16 mmol/L — ABNORMAL LOW (ref 22–32)
Calcium: 8.2 mg/dL — ABNORMAL LOW (ref 8.9–10.3)
Calcium: 8 mg/dL — ABNORMAL LOW (ref 8.9–10.3)
Chloride: 103 mmol/L (ref 98–111)
Chloride: 105 mmol/L (ref 98–111)
Creatinine, Ser: 0.64 mg/dL (ref 0.44–1.00)
Creatinine, Ser: 0.52 mg/dL (ref 0.44–1.00)
GFR, Estimated: >60 mL/min (ref >60)
GFR, Estimated: >60 mL/min (ref >60)
Glucose, Bld: 131 mg/dL — ABNORMAL HIGH (ref 70–99)
Glucose, Bld: 112 mg/dL — ABNORMAL HIGH (ref 70–99)
Potassium: 3.3 mmol/L — ABNORMAL LOW (ref 3.5–5.1)
Potassium: 3.3 mmol/L — ABNORMAL LOW (ref 3.5–5.1)
Sodium: 133 mmol/L — ABNORMAL LOW (ref 135–145)
Sodium: 134 mmol/L — ABNORMAL LOW (ref 135–145)

## 2024-03-15 LAB — GLUCOSE, CAPILLARY
Glucose-Capillary: 151 mg/dL — ABNORMAL HIGH (ref 70–99)
Glucose-Capillary: 138 mg/dL — ABNORMAL HIGH (ref 70–99)
Glucose-Capillary: 109 mg/dL — ABNORMAL HIGH (ref 70–99)
Glucose-Capillary: 114 mg/dL — ABNORMAL HIGH (ref 70–99)
Glucose-Capillary: 96 mg/dL (ref 70–99)

## 2024-03-15 LAB — CBC
HCT: 44.6 % (ref 36.0–46.0)
HCT: 49.7 % — ABNORMAL HIGH (ref 36.0–46.0)
Hemoglobin: 15.3 g/dL — ABNORMAL HIGH (ref 12.0–15.0)
Hemoglobin: 16.6 g/dL — ABNORMAL HIGH (ref 12.0–15.0)
MCH: 32.3 pg (ref 26.0–34.0)
MCH: 32.2 pg (ref 26.0–34.0)
MCHC: 34.3 g/dL (ref 30.0–36.0)
MCHC: 33.4 g/dL (ref 30.0–36.0)
MCV: 94.1 fL (ref 80.0–100.0)
MCV: 96.3 fL (ref 80.0–100.0)
Platelets: 382 K/uL (ref 150–400)
Platelets: 382 K/uL (ref 150–400)
RBC: 4.74 MIL/uL (ref 3.87–5.11)
RBC: 5.16 MIL/uL — ABNORMAL HIGH (ref 3.87–5.11)
RDW: 14.2 % (ref 11.5–15.5)
RDW: 14.2 % (ref 11.5–15.5)
WBC: 20.2 K/uL — ABNORMAL HIGH (ref 4.0–10.5)
WBC: 20.1 K/uL — ABNORMAL HIGH (ref 4.0–10.5)
nRBC: 0 % (ref 0.0–0.2)
nRBC: 0 % (ref 0.0–0.2)

## 2024-03-15 LAB — SEDIMENTATION RATE: Sed Rate: 3 mm/h (ref 0–30)

## 2024-03-15 LAB — LACTIC ACID, PLASMA: Lactic Acid, Venous: 1 mmol/L (ref 0.5–1.9)

## 2024-03-15 LAB — C-REACTIVE PROTEIN: CRP: 0.5 mg/dL (ref <1.0)

## 2024-03-15 MED ORDER — FAMOTIDINE IN NACL 20-0.9 MG/50ML-% IV SOLN
20.0000 mg | INTRAVENOUS | Status: DC
  Administered 2024-03-15 – 2024-03-17 (×3): 20 mg via INTRAVENOUS
  Filled 2024-03-15 (×3): qty 50

## 2024-03-15 MED ORDER — POTASSIUM CHLORIDE 10 MEQ/100ML IV SOLN
10.0000 meq | INTRAVENOUS | Status: AC
  Administered 2024-03-15 – 2024-03-16 (×3): 10 meq via INTRAVENOUS
  Filled 2024-03-15 (×3): qty 100

## 2024-03-15 MED ORDER — CLONIDINE HCL 0.1 MG/24HR TD PTWK
0.1000 mg | MEDICATED_PATCH | TRANSDERMAL | Status: DC
  Administered 2024-03-15 – 2024-03-22 (×2): 0.1 mg via TRANSDERMAL
  Filled 2024-03-15 (×2): qty 1

## 2024-03-15 MED ORDER — POTASSIUM CHLORIDE 10 MEQ/100ML IV SOLN
10.0000 meq | INTRAVENOUS | Status: AC
  Administered 2024-03-15 (×4): 10 meq via INTRAVENOUS
  Filled 2024-03-15 (×4): qty 100

## 2024-03-15 MED ORDER — SODIUM CHLORIDE 0.9 % IV SOLN
2.0000 g | Freq: Two times a day (BID) | INTRAVENOUS | Status: DC
  Administered 2024-03-15 – 2024-03-24 (×19): 2 g via INTRAVENOUS
  Filled 2024-03-15 (×19): qty 20

## 2024-03-15 MED ORDER — GADOBUTROL 1 MMOL/ML IV SOLN
5.0000 mL | Freq: Once | INTRAVENOUS | Status: AC | PRN
  Administered 2024-03-15: 5 mL via INTRAVENOUS

## 2024-03-15 MED ORDER — MIDAZOLAM HCL 2 MG/2ML IJ SOLN: 2.0000 mg | Freq: Once | INTRAMUSCULAR | Status: DC

## 2024-03-15 MED ORDER — DEXTROSE 5 % IV SOLN
500.0000 mg | Freq: Three times a day (TID) | INTRAVENOUS | Status: DC
  Administered 2024-03-15 – 2024-03-16 (×4): 500 mg via INTRAVENOUS
  Filled 2024-03-15 (×7): qty 10

## 2024-03-15 MED ORDER — CHLORHEXIDINE GLUCONATE CLOTH 2 % EX PADS
6.0000 | MEDICATED_PAD | Freq: Every day | CUTANEOUS | Status: DC
  Administered 2024-03-16: 6 via TOPICAL

## 2024-03-15 MED ORDER — SODIUM CHLORIDE 0.9 % IV SOLN
2.0000 g | INTRAVENOUS | Status: DC
  Administered 2024-03-15 – 2024-03-19 (×23): 2 g via INTRAVENOUS
  Filled 2024-03-15 (×33): qty 2000

## 2024-03-15 MED ORDER — SODIUM CHLORIDE 0.9 % IV SOLN: INTRAVENOUS | Status: DC

## 2024-03-15 NOTE — Progress Notes (Signed)
 Speech Language Pathology Treatment: Dysphagia  Patient Details Name: Sherry [name] MRN: [id number] DOB: 07-24-1970 Today's Date: 03/15/2024 Time: 8687-8670 SLP Time Calculation (min) (ACUTE ONLY): 17 min  Assessment / Plan / Recommendation Clinical Impression  Total multimodality cues required for visit and Pt would not open mouth to allow for oral care or ice chips. She is awake and eyes open, but does not track, and no flinch- does close her eyes when washcloth placed on head, no squeezing of hands. Pt with some dry coughing, but not related to voicing or swallow. Family at bedside and CNA. Pt is awaiting transfer to [facility] Recommend follow up there with SLP once alertness improves.    HPI HPI: Sherry [name] is a 53 y.o. female presenting for chief complaint of aggressive behavior. Pt with medical history significant for hypertension, hyperlipidemia, depression, anxiety, and COPD who presents with confusion, agitation, cough, and shortness of breath. Pt has been not responsive or following commands; BSE requested.      SLP Plan  Continue with current plan of care          Recommendations  Diet recommendations: NPO Medication Administration: Via alternative means                  Oral care QID   Frequent or constant Supervision/Assistance Dysphagia, unspecified (R13.10)     Continue with current plan of care   Thank you,  Lamar Candy, CCC-SLP [phone]   Hugh Kamara  03/15/2024, 1:44 PM

## 2024-03-15 NOTE — Consult Note (Addendum)
 I connected with  Sherry [name] on 03/15/24 by a video enabled telemedicine application and verified that I am speaking with the correct person using two identifiers.   I discussed the limitations of evaluation and management by telemedicine. The patient's fiance expressed understanding and agreed to proceed.  Location of the provider: [facility] Location of the patient: [facility]    Neurology Consultation Reason for Consult: ams Referring Physician: Dr Rendall Carwin  CC: ams  History is obtained from: Patient's fianc at bedside, chart review, Dr. Carwin  HPI: Sherry [name] is a 53 y.o. female with past medical history of hypertension, hyperlipidemia, depression and anxiety, COPD who presented with altered mental status.  Per fianc on Monday she was normal and at baseline.  However she and fianc has had intermittent cough throughout last month.  However on Tuesday, 03/13/2024, she was confused, kept asking him  what .  She then started getting agitated and therefore fianc called EMS.  On arrival to emergency room, patient was not answering questions, thrashing in bed, attempting to strike at the care team.  Her vital signs were within normal limits with blood pressure 158/88, pulse 87, afebrile.  She was given Haldol  and eventually CT head was obtained which was within normal limits.  Labs showed elevated lactate at 6.1. Patient was admitted under medicine team and started on broad-spectrum antibiotics.  MRI brain was then obtained which showed multifocal hyperintense T2-weighted cortical signal within the bilateral superior cerebral hemispheres.  Therefore neurology was consulted for further recommendations.  Fianc at bedside states she has been confused in the past when her potassium was low.  Denies any medication changes, recent travel.  States they occasionally smoke marijuana but has not had any change in the type of marijuana smoking definitely denies any other drug  use.   MND:Lwjaoz to obtain due to altered mental status.   Past Medical History:  Diagnosis Date   COPD (chronic obstructive pulmonary disease) (HCC)    Depression    High cholesterol    Hypertension    Lung collapse    left    Family History  Problem Relation Age of Onset   Colon polyps Mother    Colon cancer Maternal Grandmother    Colon cancer Maternal Grandfather    Colon cancer Paternal Grandmother    Colon cancer Paternal Grandfather    Colon cancer Paternal Uncle     Social History:  reports that she has been smoking cigarettes. She has a 26.3 pack-year smoking history. She has been exposed to tobacco smoke. She has never used smokeless tobacco. She reports current alcohol use. She reports that she does not use drugs.   Medications Prior to Admission  Medication Sig Dispense Refill Last Dose/Taking   acetaminophen  (TYLENOL ) 500 MG tablet Take 1,000 mg by mouth every 6 (six) hours as needed (pain.).   03/13/2024 Noon   albuterol  (VENTOLIN  HFA) 108 (90 Base) MCG/ACT inhaler Inhale 1-2 puffs into the lungs every 4 (four) hours as needed for shortness of breath or wheezing. 18 g 11 03/12/2024   aspirin EC 81 MG tablet Take 81 mg by mouth daily. Swallow whole.   03/13/2024 at 12:00 PM   clonazePAM  (KLONOPIN ) 0.5 MG tablet Take 0.5 mg by mouth in the morning, at noon, and at bedtime.   Past Week   FLUoxetine (PROZAC) 10 MG capsule Take 10 mg by mouth in the morning.   Unknown   fluticasone (FLONASE) 50 MCG/ACT nasal spray Place 2 sprays  into both nostrils daily as needed for allergies or rhinitis.   Past Month   ibuprofen  (ADVIL ) 200 MG tablet Take 600 mg by mouth every 8 (eight) hours as needed for headache. Ibuprofen  migraine   03/13/2024 at  3:00 PM   KLOR-CON  M20 20 MEQ tablet Take 20 mEq by mouth daily.   Unknown   metoprolol  succinate (TOPROL -XL) 50 MG 24 hr tablet Take 25 mg by mouth 2 (two) times daily.   03/11/2024   oxymetazoline (AFRIN) 0.05 % nasal spray Place 1 spray into  both nostrils 2 (two) times daily as needed for congestion.   03/12/2024   pantoprazole  (PROTONIX ) 40 MG tablet Take 40 mg by mouth daily before breakfast.   Unknown   QUEtiapine  (SEROQUEL ) 400 MG tablet Take 1 tablet (400 mg total) by mouth at bedtime. (Patient taking differently: Take 800 mg by mouth at bedtime.) 10 tablet 0 03/12/2024   rosuvastatin (CRESTOR) 40 MG tablet Take 40 mg by mouth daily.   03/12/2024   Tiotropium Bromide-Olodaterol (STIOLTO RESPIMAT ) 2.5-2.5 MCG/ACT AERS INHALE 2 PUFFS BY MOUTH ONCE DAILY (Patient taking differently: Inhale 2 puffs into the lungs daily.) 4 g 2 Past Week      Exam: Current vital signs: BP (!) 184/97 (BP Location: Left Arm)   Pulse 86   Temp 99.4 F (37.4 C) (Oral)   Resp 18   Ht 5' 5 (1.651 m)   Wt 51 kg   SpO2 96%   BMI 18.71 kg/m  Vital signs in last 24 hours: Temp:  [97.6 F (36.4 C)-99.4 F (37.4 C)] 99.4 F (37.4 C) (09/04 0603) Pulse Rate:  [78-88] 86 (09/04 0603) Resp:  [15-19] 18 (09/04 0603) BP: (159-184)/(87-97) 184/97 (09/04 0603) SpO2:  [93 %-97 %] 96 % (09/04 0603)   Physical Exam  Constitutional: Appears well-developed and well-nourished.  Neuro: Patient is laying in bed and looking around but completely nonverbal, does not answer questions, does not follow commands, spontaneously moving all 4 extremities in bed  I have reviewed labs in epic and the results pertinent to this consultation are: CBC:  Recent Labs  Lab 03/14/24 0251 03/15/24 0408  WBC 28.4* 20.2*  NEUTROABS 25.6*  --   HGB 15.7* 15.3*  HCT 45.8 44.6  MCV 94.2 94.1  PLT 469* 382    Basic Metabolic Panel:  Lab Results  Component Value Date   NA 133 (L) 03/15/2024   K 3.3 (L) 03/15/2024   CO2 20 (L) 03/15/2024   GLUCOSE 131 (H) 03/15/2024   BUN 13 03/15/2024   CREATININE 0.64 03/15/2024   CALCIUM 8.2 (L) 03/15/2024   GFRNONAA >60 03/15/2024   GFRAA >60 03/03/2019   Lipid Panel: No results found for: LDLCALC HgbA1c: No results found for:  HGBA1C Urine Drug Screen:     Component Value Date/Time   LABOPIA NONE DETECTED 03/14/2024 0404   COCAINSCRNUR NONE DETECTED 03/14/2024 0404   LABBENZ POSITIVE (A) 03/14/2024 0404   AMPHETMU NONE DETECTED 03/14/2024 0404   THCU POSITIVE (A) 03/14/2024 0404   LABBARB NONE DETECTED 03/14/2024 0404    Alcohol Level No results found for: ETH   I have reviewed the images obtained:  CT Head without contrast 03/14/2024: No acute intracranial abnormality.   MRI Brain wo contrast 03/14/2024: Multifocal hyperintense T2-weighted cortical signal within the bilateral superior cerebral hemispheres. This is a nonspecific finding that may be associated with encephalitides of various etiologies (including infectious and autoimmune). Atypical distribution of PRES is also a possibility.  ASSESSMENT/PLAN: 62 old female with sudden onset of altered mental status and nonverbal.  Acute encephalopathy - Differentials include meningitis/encephalitis versus toxic-metabolic versus seizure.  Based on clinical exam, catatonia is also in the differential, however it should not cause the MRI changes  Recommendations: - Will get MRI brain with contrast to evaluate for meningitis/encephalitis - Will also get MRA head without contrast to look for septic emboli - Further lumbar puncture ordered and pending.  However this is unlikely to be done at any point as they do not have IR staff on Thursdays - Please put her on broad spectrum antibiotics and acyclovir  in the meantime - Will give Versed  2 mg once due to catatonic like picture as well as concern for seizures - Discussed with Dr. Pearlean via secure chat and fianc at bedside.-Transfer to [facility] for further management - Please notify neurology team once patient is here.  Will need EEG as well  Thank you for allowing us  to participate in the care of this patient. If you have any further questions, please contact  me or neurohospitalist.   Arlin Krebs [facility]ile[facility] neurohospitalist

## 2024-03-15 NOTE — Progress Notes (Signed)
 PROGRESS NOTE   Sherry [name], is a 53 y.o. female, DOB - Apr 18, 1971, FMW:[id number]  Admit date - 03/14/2024   Admitting Physician Loden Laurent Pearlean, MD  Outpatient Primary MD for the patient is Sherry Aquas, MD  LOS - 1  Chief Complaint  Patient presents with   Altered Mental Status       Brief Narrative:   53 y.o. female with medical history significant for hypertension, hyperlipidemia, depression, anxiety, and COPD who presents with confusion, agitation, cough, and shortness of breath admitted on 03/13/2024 with acute encephalopathy in the setting of recent episodes of emesis and diarrhea,  meets SIRS criteria and elevated lactate  --- Concerns for encephalitis/meningitis--please see MRI brain findings -Neurologist recommended transfer to [facility] for further neuro and ID consult involvement as well as lumbar puncture   -Assessment and Plan: 1) Acute encephalopathy --??  Metabolic in the setting of dehydration and electrolyte derangement - -CT head without acute findings -MRI brain without contrast with possible  encephalitides of various etiologies (including infectious and autoimmune). Atypical distribution of PRES is also a possibility-- Serum ammonia WNL  -UDS with benzos and THC - TSH and B12 WNL -PTA patient was on benzos UDS confirm please avoid abrupt discontinuation of benzos --Infectious disease and neurology consult requested Transferred to [facility]  For lumbar puncture and further neurology and ID consults -MRI head without contrast---without septic emboli -MRI brain with contrast to evaluate for meningitis/encephalitis pending --No rash, no recent travel ,no insect bites -EEG pending -Lumbar puncture with CSF studies requested -Patient very confused and-requiring one-to-one safety monitoring --Received cefepime  Vanco and Flagyl  initially -Changed to Rocephin  (meningitic doses) and vancomycin  -Acyclovir  added on 03/15/2024 -ID physician considering changing to  ampicillin  Sed rate 3 WBC 27.6 >>28.4 >>20.2   2. SIRS dehydration with lactic acidosis--POA - Leukocytosis, tachypnea, and tachycardia present on admission  - Abdomen is soft and no-tender, there is no conspicuous pneumonia on CXR, no bacteriuria or pyuria, no meningismus, no apparent cellulitis  - She was cultured in ED and given broad spectrum antibiotics  - Antibiotics as above #1   3. AKI -- -acute kidney injury  -   creatinine on admission= 1.07,  baseline creatinine = 0.5-0.6   ,  creatinine is now= 0.6,  -- AKI resolved with IV fluids Renally adjust medications, avoid nephrotoxic agents / dehydration  / hypotension  4.  Recurrent emesis and diarrhea--- abdominal x-ray without obstructive findings or any other acute findings -Send stool for C. difficile and GI pathogen if able  5. Hypokalemia  - Replacing     6. Depression, anxiety  - Hold psychoactive medications for now     7. Hypertension  - -MRI brain suggest possible atypical distribution of PRES --BP still not at goal - Give topical clonidine  patch given inability to take oral intake at this time - May alternate IV labetalol  and IV hydralazine  as needed  8)COPD - Not in exacerbation  - Continue short-acting bronchodilators as-needed   Status is: Inpatient   Disposition: The patient is from: Home              Anticipated d/c is to: TBD              Anticipated d/c date is: > 3 days              Patient currently is not medically stable to d/c. Barriers: Not Clinically Stable-   Code Status :  -  Code Status: Full Code  Family Communication:    Discussed with boyfriend of 8 years Tim at bedside  DVT Prophylaxis  :   - SCDs   enoxaparin  (LOVENOX ) injection 40 mg Start: 03/14/24 2200   Lab Results  Component Value Date   PLT 382 03/15/2024   Inpatient Medications  Scheduled Meds:  cloNIDine   0.1 mg Transdermal Weekly   enoxaparin  (LOVENOX ) injection  40 mg Subcutaneous Q24H   midazolam   2 mg  Intravenous Once   pantoprazole   40 mg Oral QAC breakfast   sodium chloride  flush  3 mL Intravenous Q12H   Continuous Infusions:  sodium chloride  125 mL/hr at 03/15/24 1046   acyclovir  (ZOVIRAX ) 500 mg in dextrose  5 % 100 mL IVPB 500 mg (03/15/24 1207)   ampicillin  (OMNIPEN) IV 2 g (03/15/24 1606)   cefTRIAXone  (ROCEPHIN )  IV 2 g (03/15/24 1042)   vancomycin  1,000 mg (03/15/24 0933)   PRN Meds:.acetaminophen  **OR** acetaminophen , hydrALAZINE , ipratropium-albuterol , LORazepam , trimethobenzamide    Anti-infectives (From admission, onward)    Start     Dose/Rate Route Frequency Ordered Stop   03/15/24 1600  ampicillin  (OMNIPEN) 2 g in sodium chloride  0.9 % 100 mL IVPB        2 g 300 mL/hr over 20 Minutes Intravenous Every 4 hours 03/15/24 1513     03/15/24 1100  acyclovir  (ZOVIRAX ) 500 mg in dextrose  5 % 100 mL IVPB        500 mg 110 mL/hr over 60 Minutes Intravenous Every 8 hours 03/15/24 0911     03/15/24 1000  cefTRIAXone  (ROCEPHIN ) 2 g in sodium chloride  0.9 % 100 mL IVPB        2 g 200 mL/hr over 30 Minutes Intravenous Every 12 hours 03/15/24 0858     03/15/24 0800  vancomycin  (VANCOCIN ) IVPB 1000 mg/200 mL premix        1,000 mg 200 mL/hr over 60 Minutes Intravenous Every 24 hours 03/14/24 1107     03/14/24 1600  ceFEPIme  (MAXIPIME ) 2 g in sodium chloride  0.9 % 100 mL IVPB  Status:  Discontinued        2 g 200 mL/hr over 30 Minutes Intravenous Every 12 hours 03/14/24 0647 03/15/24 0858   03/14/24 0800  metroNIDAZOLE  (FLAGYL ) IVPB 500 mg        500 mg 100 mL/hr over 60 Minutes Intravenous Every 12 hours 03/14/24 0639 03/15/24 0933   03/14/24 0647  vancomycin  variable dose per unstable renal function (pharmacist dosing)  Status:  Discontinued         Does not apply See admin instructions 03/14/24 0647 03/14/24 1107   03/14/24 0415  vancomycin  (VANCOCIN ) IVPB 1000 mg/200 mL premix        1,000 mg 200 mL/hr over 60 Minutes Intravenous  Once 03/14/24 0405 03/14/24 0607   03/14/24  0415  ceFEPIme  (MAXIPIME ) 2 g in sodium chloride  0.9 % 100 mL IVPB        2 g 200 mL/hr over 30 Minutes Intravenous  Once 03/14/24 0408 03/14/24 0501        Subjective: Phuong Ebert today has no further emesis,  No chest pain,    -Tmax 100.5 , T-current 99.6 -Patient remains mostly noncommunicative, opens her eyes spontaneously but does not follow instructions -Her boyfriend of 8 years and living partner Velinda is at bedside, questions answered  Objective: Vitals:   03/15/24 1532 03/15/24 1631 03/15/24 1719 03/15/24 1733  BP: (!) 169/105 (!) 168/105  (!) 175/102  Pulse: (!) 116 (!) 114  (!) 102  Resp: (!) 30 (!) 35  (!) 36  Temp: 98 F (36.7 C) 98.1 F (36.7 C) 99.6 F (37.6 C)   TempSrc: Axillary Axillary Rectal   SpO2: 97% 99%  96%  Weight:      Height:        Intake/Output Summary (Last 24 hours) at 03/15/2024 1823 Last data filed at 03/15/2024 1730 Gross per 24 hour  Intake 464.87 ml  Output 600 ml  Net -135.13 ml   Filed Weights   03/14/24 0227  Weight: 51 kg    Physical Exam Gen:-Sleepy, but arousable, occasional episodes of restlessness HEENT:- [place].AT, No sclera icterus, mostly edentulous Neck-Supple Neck,No JVD,.  Lungs-  CTAB , fair symmetrical air movement CV- S1, S2 normal, regular  Abd-  +ve B.Sounds, Abd Soft, No tenderness,    Extremity/Skin:- No  edema, pedal pulses present , no rash, no obvious insect bites--full skin exam with chaperone and patient's significant other at bedside Psych-affect is odd, occasional episodes of restlessness, -Patient remains mostly noncommunicative, opens her eyes spontaneously but does not follow instructions neuro-limited exam , spontaneously moves extremities but will not follow instructions  Data Reviewed: I have personally reviewed following labs and imaging studies  CBC: Recent Labs  Lab 03/14/24 0251 03/15/24 0408  WBC 28.4* 20.2*  NEUTROABS 25.6*  --   HGB 15.7* 15.3*  HCT 45.8 44.6  MCV 94.2 94.1  PLT 469*  382   Basic Metabolic Panel: Recent Labs  Lab 03/14/24 0251 03/15/24 0408  NA 141 133*  K 3.1* 3.3*  CL 99 103  CO2 24 20*  GLUCOSE 154* 131*  BUN 17 13  CREATININE 1.07* 0.64  CALCIUM 9.6 8.2*  MG 2.0  --   PHOS 3.4  --    GFR: Estimated Creatinine Clearance: 64.7 mL/min (by C-G formula based on SCr of 0.64 mg/dL). Liver Function Tests: Recent Labs  Lab 03/14/24 0250  AST 39  ALT 19  ALKPHOS 78  BILITOT 0.7  PROT 6.9  ALBUMIN 3.8   Recent Results (from the past 240 hours)  Blood culture (routine x 2)     Status: None (Preliminary result)   Collection Time: 03/14/24  2:50 AM   Specimen: BLOOD  Result Value Ref Range Status   Specimen Description BLOOD RIGHT ANTECUBITAL  Final   Special Requests   Final    BOTTLES DRAWN AEROBIC AND ANAEROBIC Blood Culture results may not be optimal due to an inadequate volume of blood received in culture bottles   Culture   Final    NO GROWTH 1 DAY Performed at [facility], [address] [place], [place] [address]    Report Status PENDING  Incomplete  Resp panel by RT-PCR (RSV, Flu A&B, Covid) Anterior Nasal Swab     Status: None   Collection Time: 03/14/24  4:07 AM   Specimen: Anterior Nasal Swab  Result Value Ref Range Status   SARS Coronavirus 2 by RT PCR NEGATIVE NEGATIVE Final    Comment: (NOTE) SARS-CoV-2 target nucleic acids are NOT DETECTED.  The SARS-CoV-2 RNA is generally detectable in upper respiratory specimens during the acute phase of infection. The lowest concentration of SARS-CoV-2 viral copies this assay can detect is 138 copies/mL. A negative result does not preclude SARS-Cov-2 infection and should not be used as the sole basis for treatment or other patient management decisions. A negative result may occur with  improper specimen collection/handling, submission of specimen other than nasopharyngeal swab, presence of viral mutation(s) within the areas  targeted by this assay, and inadequate number of  viral copies(<138 copies/mL). A negative result must be combined with clinical observations, patient history, and epidemiological information. The expected result is Negative.  Fact Sheet for Patients:  BloggerCourse.com  Fact Sheet for Healthcare Providers:  SeriousBroker.it  This test is no t yet approved or cleared by the [place]  FDA and  has been authorized for detection and/or diagnosis of SARS-CoV-2 by FDA under an Emergency Use Authorization (EUA). This EUA will remain  in effect (meaning this test can be used) for the duration of the COVID-19 declaration under Section 564(b)(1) of the Act, 21 U.S.C.section 360bbb-3(b)(1), unless the authorization is terminated  or revoked sooner.       Influenza A by PCR NEGATIVE NEGATIVE Final   Influenza B by PCR NEGATIVE NEGATIVE Final    Comment: (NOTE) The Xpert Xpress SARS-CoV-2/FLU/RSV plus assay is intended as an aid in the diagnosis of influenza from Nasopharyngeal swab specimens and should not be used as a sole basis for treatment. Nasal washings and aspirates are unacceptable for Xpert Xpress SARS-CoV-2/FLU/RSV testing.  Fact Sheet for Patients: BloggerCourse.com  Fact Sheet for Healthcare Providers: SeriousBroker.it  This test is not yet approved or cleared by the [place]  FDA and has been authorized for detection and/or diagnosis of SARS-CoV-2 by FDA under an Emergency Use Authorization (EUA). This EUA will remain in effect (meaning this test can be used) for the duration of the COVID-19 declaration under Section 564(b)(1) of the Act, 21 U.S.C. section 360bbb-3(b)(1), unless the authorization is terminated or revoked.     Resp Syncytial Virus by PCR NEGATIVE NEGATIVE Final    Comment: (NOTE) Fact Sheet for Patients: BloggerCourse.com  Fact Sheet for Healthcare  Providers: SeriousBroker.it  This test is not yet approved or cleared by the [place]  FDA and has been authorized for detection and/or diagnosis of SARS-CoV-2 by FDA under an Emergency Use Authorization (EUA). This EUA will remain in effect (meaning this test can be used) for the duration of the COVID-19 declaration under Section 564(b)(1) of the Act, 21 U.S.C. section 360bbb-3(b)(1), unless the authorization is terminated or revoked.  Performed at [facility], [address] [place], [place] [address]   Blood culture (routine x 2)     Status: None (Preliminary result)   Collection Time: 03/14/24  5:20 AM   Specimen: BLOOD  Result Value Ref Range Status   Specimen Description BLOOD BLOOD LEFT WRIST  Final   Special Requests   Final    BOTTLES DRAWN AEROBIC AND ANAEROBIC Blood Culture adequate volume   Culture   Final    NO GROWTH 1 DAY Performed at [facility], [address] [place], [place] [address]    Report Status PENDING  Incomplete    Radiology Studies: MR ANGIO HEAD WO CONTRAST Result Date: 03/15/2024 EXAM: MR Angiography Head without intravenous Contrast. 03/15/2024 01:40:46 PM TECHNIQUE: Magnetic resonance angiography images of the head without intravenous contrast. Multiplanar 2D and 3D reformatted images are provided for review. COMPARISON: None provided. CLINICAL HISTORY: Septic arterial embolism. Abn mri brain FINDINGS: ANTERIOR CIRCULATION: The intracranial internal carotid arteries are patent bilaterally. There is mild irregularity of the posterior aspect of the right cavernous ICA which may be related to atherosclerosis. The right posterior communicating artery is visualized. The right M1 segment is patent. There is severe stenosis of a proximal M2 branch of the right MCA seen on series 5 image 132. The left M1 segment is patent. There is additional severe stenosis  versus short segment occlusion of a proximal M2 superdivision branch of the  left MCA seen on series 5 image 135. The anterior cerebral arteries are patent bilaterally. POSTERIOR CIRCULATION: The intracranial vertebral arteries are patent bilaterally. The basilar artery is patent. The right PCA is patent and appears primarily supplied via the anterior circulation. The left PCA is patent. The superior cerebellar arteries are patent bilaterally. PICA patent bilaterally. IMPRESSION: 1. 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 2. Severe stenosis versus short segment occlusion of a proximal M2 superdivision branch of the left MCA. 3. These foci of stenosis are relatively symmetric and occur on nearly the same axial slices suggesting that the findings may be related to artifact. There are no additional high-grade stenoses of the intracranial arteries notes. Consider CTA for further evaluation. Electronically signed by: Donnice Mania MD 03/15/2024 03:07 PM EDT RP Workstation: [id number]   MR BRAIN WO CONTRAST Result Date: 03/15/2024 EXAM: MRI BRAIN WITHOUT CONTRAST 03/14/2024 06:01:54 PM TECHNIQUE: Multiplanar multisequence MRI of the head/brain was performed without the administration of intravenous contrast. COMPARISON: None available. CLINICAL HISTORY: Mental status change, unknown cause. FINDINGS: BRAIN AND VENTRICLES: No acute infarct. No intracranial hemorrhage. No mass. No midline shift. No hydrocephalus. The sella is unremarkable. Normal flow voids. There is multifocal hyperintense T2-weighted cortical signal within the bilateral superior cerebral hemispheres. ORBITS: No acute abnormality. SINUSES AND MASTOIDS: Left mastoid effusion. BONES AND SOFT TISSUES: Normal marrow signal. No acute soft tissue abnormality. IMPRESSION: 1. Multifocal hyperintense T2-weighted cortical signal within the bilateral superior cerebral hemispheres. This is a nonspecific finding that may be associated with encephalitides of various etiologies (including infectious and autoimmune). Atypical  distribution of PRES is also a possibility. 2. Post-contrast imaging might be helpful. CSF sampling should be considered. Electronically signed by: Franky Stanford MD 03/15/2024 03:53 AM EDT RP Workstation: [id number]   DG Abd 2 Views Result Date: 03/14/2024 CLINICAL DATA:  Vomiting. EXAM: ABDOMEN - 2 VIEW COMPARISON:  None Available. FINDINGS: No bowel dilatation or evidence of obstruction. Air is noted in the colon. No free air or opaque calculus. No acute osseous pathology. IMPRESSION: No evidence of bowel obstruction. Electronically Signed   By: Vanetta Chou M.D.   On: 03/14/2024 19:53   CT HEAD WO CONTRAST ( ) Result Date: 03/14/2024 EXAM: CT HEAD WITHOUT CONTRAST 03/14/2024 06:30:56 AM TECHNIQUE: CT of the head was performed without the administration of intravenous contrast. Automated exposure control, iterative reconstruction, and/or weight based adjustment of the mA/kV was utilized to reduce the radiation dose to as low as reasonably achievable. COMPARISON: CT of the head dated 11/19/2022. CLINICAL HISTORY: Headache, new onset (Age >= 51y). Ems called out for unresponsive pt. Per ems pt has been combative and boyfriend states she is not normally like this. FINDINGS: BRAIN AND VENTRICLES: No acute hemorrhage. No evidence of acute infarct. No hydrocephalus. No extra-axial collection. No mass effect or midline shift. ORBITS: No acute abnormality. SINUSES: No acute abnormality. SOFT TISSUES AND SKULL: No acute soft tissue abnormality. No skull fracture. IMPRESSION: 1. No acute intracranial abnormality. Electronically signed by: Evalene Coho MD 03/14/2024 06:35 AM EDT RP Workstation: [id number]   DG Chest Portable 1 View Result Date: 03/14/2024 EXAM: 1 VIEW XRAY OF THE CHEST 03/14/2024 04:11:00 AM COMPARISON: AP radiograph of the chest dated 02/04/2023. CLINICAL HISTORY: Sob. Altered mental status, restlessness FINDINGS: LUNGS AND PLEURA: Coarse interstitial opacities again demonstrated diffusely,  which appear chronic. No focal pulmonary opacity. No pulmonary edema. No pleural effusion. No pneumothorax.  HEART AND MEDIASTINUM: No acute abnormality of the cardiac and mediastinal silhouettes. BONES AND SOFT TISSUES: No acute osseous abnormality. IMPRESSION: 1. No acute findings. 2. Coarse interstitial opacities diffusely, appearing chronic. Electronically signed by: Evalene Coho MD 03/14/2024 04:44 AM EDT RP Workstation: [id number]   Scheduled Meds:  cloNIDine   0.1 mg Transdermal Weekly   enoxaparin  (LOVENOX ) injection  40 mg Subcutaneous Q24H   midazolam   2 mg Intravenous Once   pantoprazole   40 mg Oral QAC breakfast   sodium chloride  flush  3 mL Intravenous Q12H   Continuous Infusions:  sodium chloride  125 mL/hr at 03/15/24 1046   acyclovir  (ZOVIRAX ) 500 mg in dextrose  5 % 100 mL IVPB 500 mg (03/15/24 1207)   ampicillin  (OMNIPEN) IV 2 g (03/15/24 1606)   cefTRIAXone  (ROCEPHIN )  IV 2 g (03/15/24 1042)   vancomycin  1,000 mg (03/15/24 0933)    LOS: 1 day   Rendall Carwin M.D on 03/15/2024 at 6:23 PM  Go to [email or web address] - for contact info  [facility] - Office  [phone]  If 7PM-7AM, please contact night-coverage [email or web address] 03/15/2024, 6:23 PM

## 2024-03-15 NOTE — Progress Notes (Addendum)
 Pharmacy Antibiotic Note  Sherry [name] is a 53 y.o. female admitted on 03/14/2024 with altered mental status.  Pharmacy has been consulted for acyclovir  dosing.  Patient with tmax of 99.4, wbc up at 20.2. Renal function normal.   Plan: Acyclovir  500mg  IV q8 hours Hydrate with NS 125ml/hr Ceftriaxone  2g q12 per MD Continue Vancomycin  1g q24 hours  Height: 5' 5 (165.1 cm) Weight: 51 kg (112 lb 7 oz) IBW/kg (Calculated) : 57  Temp (24hrs), Avg:98.4 F (36.9 C), Min:97.6 F (36.4 C), Max:99.4 F (37.4 C)  Recent Labs  Lab 03/14/24 0250 03/14/24 0251 03/14/24 0521 03/14/24 0926 03/15/24 0408  WBC  --  28.4*  --   --  20.2*  CREATININE  --  1.07*  --   --  0.64  LATICACIDVEN 6.1*  --  2.6* 1.3  --     Estimated Creatinine Clearance: 64.7 mL/min (by C-G formula based on SCr of 0.64 mg/dL).    Allergies  Allergen Reactions   Dramamine [Dimenhydrinate] Swelling and Other (See Comments)    Made her feel really drunk. Swelling of tongue     Prednisone  Other (See Comments)    Feels like fingers are swelling    Antimicrobials this admission: Vanc 9/3 Ceftriaxone  9/4> Flagyl  x3 doses Acyclovir  9/4>  Microbiology results: 9/3 BCx x 2:ngtd    Thank you for allowing pharmacy to be a part of this patient's care.  Dempsey Blush PharmD., BCPS Clinical Pharmacist 03/15/2024 9:21 AM

## 2024-03-16 ENCOUNTER — Inpatient Hospital Stay (HOSPITAL_COMMUNITY)
Admit: 2024-03-16 | Discharge: 2024-03-16 | Disposition: A | Discharge status: Home / Self Care | Payer: MEDICAID | Attending: Family Medicine | Admitting: Family Medicine

## 2024-03-16 ENCOUNTER — Inpatient Hospital Stay (HOSPITAL_COMMUNITY): Payer: MEDICAID

## 2024-03-16 ENCOUNTER — Other Ambulatory Visit (HOSPITAL_COMMUNITY): Payer: Self-pay | Admitting: *Deleted

## 2024-03-16 ENCOUNTER — Encounter (HOSPITAL_COMMUNITY): Payer: Self-pay | Admitting: Family Medicine

## 2024-03-16 DIAGNOSIS — E872 Acidosis, unspecified: Secondary | ICD-10-CM | POA: Diagnosis not present

## 2024-03-16 DIAGNOSIS — G009 Bacterial meningitis, unspecified: Secondary | ICD-10-CM | POA: Diagnosis not present

## 2024-03-16 DIAGNOSIS — G039 Meningitis, unspecified: Secondary | ICD-10-CM | POA: Diagnosis not present

## 2024-03-16 DIAGNOSIS — R4182 Altered mental status, unspecified: Secondary | ICD-10-CM

## 2024-03-16 DIAGNOSIS — I10 Essential (primary) hypertension: Secondary | ICD-10-CM | POA: Diagnosis not present

## 2024-03-16 DIAGNOSIS — G9341 Metabolic encephalopathy: Secondary | ICD-10-CM | POA: Diagnosis not present

## 2024-03-16 DIAGNOSIS — F32A Depression, unspecified: Secondary | ICD-10-CM | POA: Diagnosis not present

## 2024-03-16 DIAGNOSIS — G0481 Other encephalitis and encephalomyelitis: Secondary | ICD-10-CM | POA: Diagnosis not present

## 2024-03-16 DIAGNOSIS — G9340 Encephalopathy, unspecified: Secondary | ICD-10-CM | POA: Diagnosis not present

## 2024-03-16 DIAGNOSIS — R6520 Severe sepsis without septic shock: Secondary | ICD-10-CM | POA: Diagnosis not present

## 2024-03-16 DIAGNOSIS — J449 Chronic obstructive pulmonary disease, unspecified: Secondary | ICD-10-CM | POA: Diagnosis not present

## 2024-03-16 DIAGNOSIS — Z1152 Encounter for screening for COVID-19: Secondary | ICD-10-CM | POA: Diagnosis not present

## 2024-03-16 DIAGNOSIS — A419 Sepsis, unspecified organism: Secondary | ICD-10-CM | POA: Diagnosis not present

## 2024-03-16 DIAGNOSIS — N179 Acute kidney failure, unspecified: Secondary | ICD-10-CM | POA: Diagnosis not present

## 2024-03-16 DIAGNOSIS — I6783 Posterior reversible encephalopathy syndrome: Secondary | ICD-10-CM

## 2024-03-16 DIAGNOSIS — R569 Unspecified convulsions: Secondary | ICD-10-CM | POA: Diagnosis not present

## 2024-03-16 LAB — CSF CELL COUNT WITH DIFFERENTIAL
RBC Count, CSF: 1 /mm3 — ABNORMAL HIGH
RBC Count, CSF: 2 /mm3 — ABNORMAL HIGH
Tube #: 1
Tube #: 4
WBC, CSF: 3 /mm3 (ref 0–5)
WBC, CSF: 8 /mm3 — ABNORMAL HIGH (ref 0–5)

## 2024-03-16 LAB — GLUCOSE, CAPILLARY
Glucose-Capillary: 119 mg/dL — ABNORMAL HIGH (ref 70–99)
Glucose-Capillary: 121 mg/dL — ABNORMAL HIGH (ref 70–99)
Glucose-Capillary: 115 mg/dL — ABNORMAL HIGH (ref 70–99)
Glucose-Capillary: 111 mg/dL — ABNORMAL HIGH (ref 70–99)

## 2024-03-16 LAB — CBC WITH DIFFERENTIAL/PLATELET
Abs Immature Granulocytes: 0.09 K/uL — ABNORMAL HIGH (ref 0.00–0.07)
Basophils Absolute: 0.1 K/uL (ref 0.0–0.1)
Basophils Relative: 0 %
Eosinophils Absolute: 0 K/uL (ref 0.0–0.5)
Eosinophils Relative: 0 %
HCT: 40.6 % (ref 36.0–46.0)
Hemoglobin: 13.8 g/dL (ref 12.0–15.0)
Immature Granulocytes: 1 %
Lymphocytes Relative: 19 %
Lymphs Abs: 2.7 K/uL (ref 0.7–4.0)
MCH: 32.2 pg (ref 26.0–34.0)
MCHC: 34 g/dL (ref 30.0–36.0)
MCV: 94.6 fL (ref 80.0–100.0)
Monocytes Absolute: 1.5 K/uL — ABNORMAL HIGH (ref 0.1–1.0)
Monocytes Relative: 10 %
Neutro Abs: 10 K/uL — ABNORMAL HIGH (ref 1.7–7.7)
Neutrophils Relative %: 70 %
Platelets: 247 K/uL (ref 150–400)
RBC: 4.29 MIL/uL (ref 3.87–5.11)
RDW: 13.8 % (ref 11.5–15.5)
WBC: 14.3 K/uL — ABNORMAL HIGH (ref 4.0–10.5)
nRBC: 0 % (ref 0.0–0.2)

## 2024-03-16 LAB — MENINGITIS/ENCEPHALITIS PANEL (CSF)

## 2024-03-16 LAB — RENAL FUNCTION PANEL
Albumin: 3 g/dL — ABNORMAL LOW (ref 3.5–5.0)
Anion gap: 12 (ref 5–15)
BUN: 9 mg/dL (ref 6–20)
CO2: 18 mmol/L — ABNORMAL LOW (ref 22–32)
Calcium: 8 mg/dL — ABNORMAL LOW (ref 8.9–10.3)
Chloride: 106 mmol/L (ref 98–111)
Creatinine, Ser: 0.53 mg/dL (ref 0.44–1.00)
GFR, Estimated: >60 mL/min (ref >60)
Glucose, Bld: 142 mg/dL — ABNORMAL HIGH (ref 70–99)
Phosphorus: 1.7 mg/dL — ABNORMAL LOW (ref 2.5–4.6)
Potassium: 2.9 mmol/L — ABNORMAL LOW (ref 3.5–5.1)
Sodium: 136 mmol/L (ref 135–145)

## 2024-03-16 LAB — PROTEIN AND GLUCOSE, CSF
Glucose, CSF: 62 mg/dL (ref 40–70)
Total  Protein, CSF: 28 mg/dL (ref 15–45)

## 2024-03-16 LAB — MAGNESIUM: Magnesium: 2.1 mg/dL (ref 1.7–2.4)

## 2024-03-16 LAB — RAPID URINE DRUG SCREEN, HOSP PERFORMED
Amphetamines: NOT DETECTED
Barbiturates: NOT DETECTED
Benzodiazepines: POSITIVE — AB
Cocaine: NOT DETECTED
Opiates: NOT DETECTED
Tetrahydrocannabinol: POSITIVE — AB

## 2024-03-16 LAB — MRSA NEXT GEN BY PCR, NASAL: MRSA by PCR Next Gen: NOT DETECTED

## 2024-03-16 MED ORDER — LABETALOL HCL 5 MG/ML IV SOLN: 20.0000 mg | Freq: Once | INTRAVENOUS | Status: DC

## 2024-03-16 MED ORDER — METOPROLOL TARTRATE 25 MG PO TABS
25.0000 mg | ORAL_TABLET | Freq: Two times a day (BID) | ORAL | Status: DC
  Administered 2024-03-16 – 2024-03-24 (×16): 25 mg via ORAL
  Filled 2024-03-16 (×17): qty 1

## 2024-03-16 MED ORDER — POTASSIUM PHOSPHATES 15 MMOLE/5ML IV SOLN
30.0000 mmol | Freq: Once | INTRAVENOUS | Status: AC
  Administered 2024-03-16: 30 mmol via INTRAVENOUS
  Filled 2024-03-16: qty 10

## 2024-03-16 MED ORDER — POTASSIUM CHLORIDE CRYS ER 20 MEQ PO TBCR
40.0000 meq | EXTENDED_RELEASE_TABLET | ORAL | Status: AC
  Administered 2024-03-16 (×2): 40 meq via ORAL
  Filled 2024-03-16 (×2): qty 2

## 2024-03-16 MED ORDER — AMLODIPINE BESYLATE 5 MG PO TABS
2.5000 mg | ORAL_TABLET | Freq: Every day | ORAL | Status: DC
  Administered 2024-03-16 – 2024-03-18 (×3): 2.5 mg via ORAL
  Filled 2024-03-16 (×3): qty 1

## 2024-03-16 MED ORDER — NICOTINE 21 MG/24HR TD PT24
21.0000 mg | MEDICATED_PATCH | Freq: Every day | TRANSDERMAL | Status: DC
  Administered 2024-03-16 – 2024-03-24 (×9): 21 mg via TRANSDERMAL
  Filled 2024-03-16 (×9): qty 1

## 2024-03-16 NOTE — Procedures (Signed)
 Technically successful fluoro guided LP at L4-L5 level with opening pressure of 21 cm H2O and closing pressure of 12 cm H2O.  Approximately 20 cc of clear, colorless CSF sent to lab for analysis.  No immediate post procedural complication.  Bedrest with bathroom privileges x 6 hours, head of bed flat x 6 hours (may sit up x 10 minutes to eat/drink) to help prevent post LP headache.  Please see imaging section of Epic for full dictation.  Clotilda Hesselbach, PA-C

## 2024-03-16 NOTE — Consult Note (Addendum)
 Virtual Visit via Telephone/Video Note   I connected with Sherry [name]   On 03/16/2024 at 5:45 AM  by Video and verified that I am speaking with the correct person using two identifiers.   I discussed the limitations, risks, security and privacy concerns of performing an evaluation and management service by vedio and the availability of in person visit Location:   Patient: [facility] Provider: [facility]    Date of Admission:  03/14/2024   Total days of inpatient antibiotics 1        Reason for Consult: Encephalitis    Principal Problem:   Acute encephalopathy Active Problems:   Essential hypertension   COPD GOLD 2   SIRS (systemic inflammatory response syndrome) (HCC)   Hypokalemia   Acute metabolic encephalopathy   Assessment: 53 year old female history of anxiety/depression, COPD, hypertension, hyperlipidemia admitted with altered mental status found to have #Acute encephalopathy secondary to possibly infectious meningitis/encephalitis. #Leukocytosis #Fever, Tmax 100.5 - Communicated with her partner who stated that he had some diarrheal illness with GI upset few days prior.  Patient has similar symptoms.  She is presenting with confusion day of hospitalization.  On arrival she was afebrile but developed temp 100.5.  WBC 28K on arrival.  Blood cultures obtained.  She was started on empiric meningitis coverage.  MRI showed multifocal cortical signal in bilateral cerebral hemisphere nonspecific, very greatest etiologies possible include infectious, autoimmune. - ID and neurology engaged. - Neurology order MRI head. LP pending.  Noted catatonic like picture with concerns for seizures as well.  Recommendations:  -Add ampicillin  - Continue ceftriaxone , vancomycin , acyclovir  -Follow blood cultures - Follow-up on LP results.  Given history of prodromal diarrheal/GI illness, suspect possibly viral meningitis/encephalitis.  Although viral illnesses  generally are common with higher fevers. - Continue to follow-up neurology recommendations - Plan communicated with primary - Droplet precautions Microbiology:   Antibiotics: Cefepime  9/2 - 9/3 Vancomycin  9/2-present Acyclovir  9/4  Cultures: Blood 9/2 Urine  Other   HPI: Sherry [name] is a 53 y.o. female with past medical history of hypertension, hyperlipidemia, depression, anxiety, COPD presenting confusion, agitation cough and shortness of breath.  On arrival patient initially was afebrile, developed fever during hospitalization Tmax 100.5.  WBC 28K.  Chest x-ray negative. SABRAMRI brain showed multifocal cortical signal in bilateral cerebral hemisphere nonspecific, very greatest etiologies possible include infectious, autoimmune.  ID engaged for antibiotic recommendations.  Review of Systems: Review of Systems  All other systems reviewed and are negative.   Past Medical History:  Diagnosis Date   COPD (chronic obstructive pulmonary disease) (HCC)    Depression    High cholesterol    Hypertension    Lung collapse    left    Social History   Tobacco Use   Smoking status: Every Day    Current packs/day: 0.75    Average packs/day: 0.8 packs/day for 35.0 years (26.3 ttl pk-yrs)    Types: Cigarettes    Passive exposure: Current   Smokeless tobacco: Never  Vaping Use   Vaping status: Never Used  Substance Use Topics   Alcohol use: Yes    Comment: occasionally    Drug use: Never    Family History  Problem Relation Age of Onset   Colon polyps Mother    Colon cancer Maternal Grandmother    Colon cancer Maternal Grandfather    Colon cancer Paternal Grandmother    Colon cancer Paternal Grandfather  Colon cancer Paternal Uncle    Scheduled Meds:  Chlorhexidine  Gluconate Cloth  6 each Topical Daily   cloNIDine   0.1 mg Transdermal Weekly   enoxaparin  (LOVENOX ) injection  40 mg Subcutaneous Q24H   midazolam   2 mg Intravenous Once   pantoprazole   40 mg Oral QAC  breakfast   sodium chloride  flush  3 mL Intravenous Q12H   Continuous Infusions:  sodium chloride  125 mL/hr at 03/16/24 0417   acyclovir  (ZOVIRAX ) 500 mg in dextrose  5 % 100 mL IVPB Stopped (03/15/24 2339)   ampicillin  (OMNIPEN) IV 2 g (03/16/24 0438)   cefTRIAXone  (ROCEPHIN )  IV Stopped (03/15/24 2308)   famotidine  (PEPCID ) IV Stopped (03/15/24 2105)   vancomycin  1,000 mg (03/15/24 0933)   PRN Meds:.acetaminophen  **OR** acetaminophen , hydrALAZINE , ipratropium-albuterol , LORazepam , trimethobenzamide  Allergies  Allergen Reactions   Dramamine [Dimenhydrinate] Swelling and Other (See Comments)    Made her feel really drunk. Swelling of tongue     Prednisone  Other (See Comments)    Feels like fingers are swelling    OBJECTIVE: Blood pressure (!) 184/92, pulse (!) 102, temperature 99.1 F (37.3 C), temperature source Oral, resp. rate 19, height 5' 5 (1.651 m), weight 61.2 kg, SpO2 99%.    Lab Results Lab Results  Component Value Date   WBC 20.1 (H) 03/15/2024   HGB 16.6 (H) 03/15/2024   HCT 49.7 (H) 03/15/2024   MCV 96.3 03/15/2024   PLT 382 03/15/2024    Lab Results  Component Value Date   CREATININE 0.52 03/15/2024   BUN 12 03/15/2024   NA 134 (L) 03/15/2024   K 3.3 (L) 03/15/2024   CL 105 03/15/2024   CO2 16 (L) 03/15/2024    Lab Results  Component Value Date   ALT 19 03/14/2024   AST 39 03/14/2024   ALKPHOS 78 03/14/2024   BILITOT 0.7 03/14/2024       Loney Stank, MD [facility] 03/16/2024, 5:46 AM   Evaluation of this patient requires complex antimicrobial therapy evaluation and counseling + isolation needs for disease transmission risk assessment and mitigation

## 2024-03-16 NOTE — Progress Notes (Signed)
 Pt VS taken, pt was still red MEWS and  HR ranging 130 to 140.  Pt RR was 38. PRN Ativan  was given for anxiety with no change. Pt RR then increased  from 38 to 52. On call provider notified the charge nurse.  RRT were called for further evaluation. Order received  to transfer Pt a higher level of care. Pt family were and updated.

## 2024-03-16 NOTE — Procedures (Signed)
 Patient Name: Sherry [name]  MRN: [id number]  Epilepsy Attending: Arlin MALVA Krebs  Referring Physician/Provider: Pearlean Manus, MD  Date: 03/16/2024 Duration: 23.49 mins  Patient history: 53yo F with ams. EEG to evaluate for seizure  Level of alertness: Awake  AEDs during EEG study: None  Technical aspects: This EEG study was done with scalp electrodes positioned according to the 10-20 International system of electrode placement. Electrical activity was reviewed with band pass filter of 1-70Hz , sensitivity of 7 uV/mm, display speed of 3mm/sec with a 60Hz  notched filter applied as appropriate. EEG data were recorded continuously and digitally stored.  Video monitoring was available and reviewed as appropriate.  Description: The posterior dominant rhythm consists of 8 Hz activity of moderate voltage (25-35 uV) seen predominantly in posterior head regions, symmetric and reactive to eye opening and eye closing. Hyperventilation and photic stimulation were not performed.     IMPRESSION: This study is within normal limits. No seizures or epileptiform discharges were seen throughout the recording.  A normal interictal EEG does not exclude the diagnosis of epilepsy.   Serigne Kubicek O Chaz Ronning

## 2024-03-16 NOTE — Progress Notes (Signed)
 Speech Language Pathology Treatment: Dysphagia  Patient Details Name: Sherry [name] MRN: [id number] DOB: 05/03/1971 Today's Date: 03/16/2024 Time: 8969-8897 SLP Time Calculation (min) (ACUTE ONLY): 32 min  Assessment / Plan / Recommendation Clinical Impression  Ongoing dysphagia treatment provided today; Pt with total improvement today verbally responding, aware and answering questions accurately. Note some mild confusion (Pt mistakenly identified SLP as her daughter, however SLP was wearing full contact precautions and does resemble daughter when shown picuture). Pt consumed thin liquids without incident - no dysphagia. Pt not assessed with solids as Pt is on full liquid diet. Defer diet advancement to attending as medically appropriate. Our service will sign off, no further ST needs indicated at this time. Thank you,   HPI HPI: Sherry [name] is a 53 y.o. female presenting for chief complaint of aggressive behavior. Pt with medical history significant for hypertension, hyperlipidemia, depression, anxiety, and COPD who presents with confusion, agitation, cough, and shortness of breath. Pt has been not responsive or following commands; BSE requested.      SLP Plan  Continue with current plan of care          Recommendations  Diet recommendations: Thin liquid Medication Administration: Whole meds with liquid Supervision: Patient able to self feed Compensations: Minimize environmental distractions;Slow rate;Small sips/bites Postural Changes and/or Swallow Maneuvers: Seated upright 90 degrees                  Oral care BID     Dysphagia, unspecified (R13.10)     Continue with current plan of care    Markon Jares H. Clois KILLIAN, CCC-SLP Speech Language Pathologist  [name]  03/16/2024, 11:03 AM

## 2024-03-16 NOTE — Progress Notes (Addendum)
 PROGRESS NOTE  Sherry [name], is a 53 y.o. female, DOB - 1970/07/18, FMW:[id number]  Admit date - 03/14/2024   Admitting Physician Samah Lapiana Pearlean, MD  Outpatient Primary MD for the patient is [name], MD  LOS - 2  Chief Complaint  Patient presents with   Altered Mental Status      Brief Narrative:   53 y.o. female with medical history significant for hypertension, hyperlipidemia, depression, anxiety, and COPD who presents with confusion, agitation, cough, and shortness of breath admitted on 03/13/2024 with acute encephalopathy in the setting of recent episodes of emesis and diarrhea,  meets SIRS criteria and elevated lactate  --- Concerns for encephalitis/meningitis--please see MRI brain findings - Awaiting results of CSF studies after LP from 03/16/2024 ---Awaiting CSF studies to determine if acyclovir  and broad-spectrum antibiotics should be continued    -Assessment and Plan: 1) Acute encephalopathy --??  Metabolic in the setting of dehydration and electrolyte derangement or Infectious Given fevers and GI symptoms---Concerns for encephalitis/meningitis - -CT head without acute findings -MRI brain without contrast with possible  encephalitides of various etiologies (including infectious and autoimmune). Atypical distribution of PRES is also a possibility-- Serum ammonia WNL  -UDS with benzos and THC - TSH and B12 WNL -PTA patient was on benzos UDS confirm please avoid abrupt discontinuation of benzos --Infectious disease and neurology consult requested Transferred to [facility]  For lumbar puncture and further neurology and ID consults -MRI head without contrast---without septic emboli -MRI brain with contrast to evaluate for meningitis/encephalitis pending --No rash, no recent travel ,no insect bites -Lumbar puncture with CSF studies requested --Received cefepime  Vanco and Flagyl  initially -Changed to Rocephin  (meningitic doses) and vancomycin  -Acyclovir  added on  03/15/2024 -ID physician considering changing to ampicillin  Sed rate 3, CRP 0.5 WBC 27.6 >>28.4 >>20.2>>14.3 -Mentation is back to baseline as of 03/16/2024 -Tolerated LP well on 03/16/2024 --Awaiting CSF studies to determine if acyclovir  and broad-spectrum antibiotics should be continued  -- EEG requested   2. SIRS dehydration with lactic acidosis--POA - Leukocytosis, tachypnea, and tachycardia present on admission  - Abdomen is soft and no-tender, there is no conspicuous pneumonia on CXR, no bacteriuria or pyuria, no meningismus, no apparent cellulitis  -Blood and CSF cultures pending - Antibiotics as above #1   3. AKI -- -acute kidney injury  -   creatinine on admission= 1.07,  baseline creatinine = 0.5-0.6   ,  creatinine is now= 0.6,  -- AKI resolved with IV fluids Renally adjust medications, avoid nephrotoxic agents / dehydration  / hypotension  4.  Recurrent emesis and diarrhea--- abdominal x-ray without obstructive findings or any other acute findings -Send stool for C. difficile and GI pathogen if able -- Has been n.p.o. due to altered mentation--so no BM  5. Hypokalemia/hyponatremia/hypophosphatemia--due to GI losses in the setting of emesis and diarrhea prior to admission as well as poor oral intake - Sodium normalized with hydration -Continue IV fluids until oral intake is more reliable - Continue to replace electrolyte -Magnesium  WNL  6. Depression/Anxiety  - Hold psychoactive medications for now     7. Hypertension  - -MRI brain suggest possible atypical distribution of PRES --BP still not at goal, but overall BP trend improving - c/n topical clonidine  patch  --Anticipate that BP will improve further now that patient is alert and awake and able to take oral medications -- May alternate IV labetalol  and IV hydralazine  as needed  8)COPD - Not in exacerbation  - Continue short-acting bronchodilators as-needed  Status is: Inpatient   Disposition: The patient is  from: Home              Anticipated d/c is to: TBD              Anticipated d/c date is: > 3 days              Patient currently is not medically stable to d/c. Barriers: Not Clinically Stable-   Code Status :  -  Code Status: Full Code   Family Communication:    Discussed with boyfriend of 8 years Tim at bedside  DVT Prophylaxis  :   - SCDs   enoxaparin  (LOVENOX ) injection 40 mg Start: 03/14/24 2200   Lab Results  Component Value Date   PLT 247 03/16/2024   Inpatient Medications  Scheduled Meds:  amLODipine   2.5 mg Oral Daily   Chlorhexidine  Gluconate Cloth  6 each Topical Daily   cloNIDine   0.1 mg Transdermal Weekly   enoxaparin  (LOVENOX ) injection  40 mg Subcutaneous Q24H   labetalol   20 mg Intravenous Once   metoprolol  tartrate  25 mg Oral BID   midazolam   2 mg Intravenous Once   nicotine   21 mg Transdermal Daily   pantoprazole   40 mg Oral QAC breakfast   potassium chloride   40 mEq Oral Q3H   sodium chloride  flush  3 mL Intravenous Q12H   Continuous Infusions:  sodium chloride  125 mL/hr at 03/16/24 9380   acyclovir  (ZOVIRAX ) 500 mg in dextrose  5 % 100 mL IVPB 500 mg (03/16/24 0634)   ampicillin  (OMNIPEN) IV 2 g (03/16/24 1235)   cefTRIAXone  (ROCEPHIN )  IV 2 g (03/16/24 1016)   famotidine  (PEPCID ) IV Stopped (03/15/24 2105)   vancomycin  1,000 mg (03/16/24 0903)   PRN Meds:.acetaminophen  **OR** acetaminophen , hydrALAZINE , ipratropium-albuterol , LORazepam , trimethobenzamide    Anti-infectives (From admission, onward)    Start     Dose/Rate Route Frequency Ordered Stop   03/15/24 1600  ampicillin  (OMNIPEN) 2 g in sodium chloride  0.9 % 100 mL IVPB        2 g 300 mL/hr over 20 Minutes Intravenous Every 4 hours 03/15/24 1513     03/15/24 1100  acyclovir  (ZOVIRAX ) 500 mg in dextrose  5 % 100 mL IVPB        500 mg 110 mL/hr over 60 Minutes Intravenous Every 8 hours 03/15/24 0911     03/15/24 1000  cefTRIAXone  (ROCEPHIN ) 2 g in sodium chloride  0.9 % 100 mL IVPB        2  g 200 mL/hr over 30 Minutes Intravenous Every 12 hours 03/15/24 0858     03/15/24 0800  vancomycin  (VANCOCIN ) IVPB 1000 mg/200 mL premix        1,000 mg 200 mL/hr over 60 Minutes Intravenous Every 24 hours 03/14/24 1107     03/14/24 1600  ceFEPIme  (MAXIPIME ) 2 g in sodium chloride  0.9 % 100 mL IVPB  Status:  Discontinued        2 g 200 mL/hr over 30 Minutes Intravenous Every 12 hours 03/14/24 0647 03/15/24 0858   03/14/24 0800  metroNIDAZOLE  (FLAGYL ) IVPB 500 mg        500 mg 100 mL/hr over 60 Minutes Intravenous Every 12 hours 03/14/24 0639 03/16/24 0906   03/14/24 0647  vancomycin  variable dose per unstable renal function (pharmacist dosing)  Status:  Discontinued         Does not apply See admin instructions 03/14/24 0647 03/14/24 1107   03/14/24 0415  vancomycin  (VANCOCIN ) IVPB  1000 mg/200 mL premix        1,000 mg 200 mL/hr over 60 Minutes Intravenous  Once 03/14/24 0405 03/14/24 0607   03/14/24 0415  ceFEPIme  (MAXIPIME ) 2 g in sodium chloride  0.9 % 100 mL IVPB        2 g 200 mL/hr over 30 Minutes Intravenous  Once 03/14/24 0408 03/14/24 0501        Subjective: Zona Benyo today has no further emesis,  No chest pain,    --No further fevers - Significant other at bedside - Mentation back to baseline - Patient is verbal and communicative -- Tolerating oral intake well - --Awaiting CSF studies to determine if acyclovir  and broad-spectrum antibiotics should be continued   Objective: Vitals:   03/16/24 1130 03/16/24 1150 03/16/24 1200 03/16/24 1220  BP: (!) 152/77  (!) 150/93 (!) 152/88  Pulse: 85 (!) 108 (!) 102 (!) 109  Resp: (!) 22 (!) 22 (!) 22 20  Temp:      TempSrc:      SpO2: 100% 99% 100% 100%  Weight:      Height:        Intake/Output Summary (Last 24 hours) at 03/16/2024 1338 Last data filed at 03/16/2024 0935 Gross per 24 hour  Intake 2729.95 ml  Output 400 ml  Net 2329.95 ml   Filed Weights   03/14/24 0227 03/15/24 2223  Weight: 51 kg 61.2 kg     Physical Exam Gen:-Alert and oriented, cooperative, no acute distress HEENT:- [place].AT, No sclera icterus, mostly edentulous Neck-Supple Neck,No JVD,.  Lungs-  CTAB , fair symmetrical air movement CV- S1, S2 normal, regular  Abd-  +ve B.Sounds, Abd Soft, No tenderness,    Extremity/Skin:- No  edema, pedal pulses present , no rash, no obvious insect bites--full skin exam with chaperone and patient's significant other at bedside Psych-affect is appropriate,, alert and oriented x 3, mentation is back to baseline,- Patient is verbal and communicative Neuro--no new focal deficits, no tremors - Neuroexam is reassuring  Data Reviewed: I have personally reviewed following labs and imaging studies  CBC: Recent Labs  Lab 03/14/24 0251 03/15/24 0408 03/15/24 2206 03/16/24 0931  WBC 28.4* 20.2* 20.1* 14.3*  NEUTROABS 25.6*  --   --  10.0*  HGB 15.7* 15.3* 16.6* 13.8  HCT 45.8 44.6 49.7* 40.6  MCV 94.2 94.1 96.3 94.6  PLT 469* 382 382 247   Basic Metabolic Panel: Recent Labs  Lab 03/14/24 0251 03/15/24 0408 03/15/24 2206 03/16/24 0931  NA 141 133* 134* 136  K 3.1* 3.3* 3.3* 2.9*  CL 99 103 105 106  CO2 24 20* 16* 18*  GLUCOSE 154* 131* 112* 142*  BUN 17 13 12 9   CREATININE 1.07* 0.64 0.52 0.53  CALCIUM 9.6 8.2* 8.0* 8.0*  MG 2.0  --  2.1  --   PHOS 3.4  --   --  1.7*   GFR: Estimated Creatinine Clearance: 72.3 mL/min (by C-G formula based on SCr of 0.53 mg/dL). Liver Function Tests: Recent Labs  Lab 03/14/24 0250 03/16/24 0931  AST 39  --   ALT 19  --   ALKPHOS 78  --   BILITOT 0.7  --   PROT 6.9  --   ALBUMIN 3.8 3.0*   Recent Results (from the past 240 hours)  Blood culture (routine x 2)     Status: None (Preliminary result)   Collection Time: 03/14/24  2:50 AM   Specimen: BLOOD  Result Value Ref Range Status  Specimen Description BLOOD RIGHT ANTECUBITAL  Final   Special Requests   Final    BOTTLES DRAWN AEROBIC AND ANAEROBIC Blood Culture results may not  be optimal due to an inadequate volume of blood received in culture bottles   Culture   Final    NO GROWTH 2 DAYS Performed at [facility], [address] [place], [place] [address]    Report Status PENDING  Incomplete  Resp panel by RT-PCR (RSV, Flu A&B, Covid) Anterior Nasal Swab     Status: None   Collection Time: 03/14/24  4:07 AM   Specimen: Anterior Nasal Swab  Result Value Ref Range Status   SARS Coronavirus 2 by RT PCR NEGATIVE NEGATIVE Final    Comment: (NOTE) SARS-CoV-2 target nucleic acids are NOT DETECTED.  The SARS-CoV-2 RNA is generally detectable in upper respiratory specimens during the acute phase of infection. The lowest concentration of SARS-CoV-2 viral copies this assay can detect is 138 copies/mL. A negative result does not preclude SARS-Cov-2 infection and should not be used as the sole basis for treatment or other patient management decisions. A negative result may occur with  improper specimen collection/handling, submission of specimen other than nasopharyngeal swab, presence of viral mutation(s) within the areas targeted by this assay, and inadequate number of viral copies(<138 copies/mL). A negative result must be combined with clinical observations, patient history, and epidemiological information. The expected result is Negative.  Fact Sheet for Patients:  BloggerCourse.com  Fact Sheet for Healthcare Providers:  SeriousBroker.it  This test is no t yet approved or cleared by the [place]  FDA and  has been authorized for detection and/or diagnosis of SARS-CoV-2 by FDA under an Emergency Use Authorization (EUA). This EUA will remain  in effect (meaning this test can be used) for the duration of the COVID-19 declaration under Section 564(b)(1) of the Act, 21 U.S.C.section 360bbb-3(b)(1), unless the authorization is terminated  or revoked sooner.       Influenza A by PCR NEGATIVE NEGATIVE Final    Influenza B by PCR NEGATIVE NEGATIVE Final    Comment: (NOTE) The Xpert Xpress SARS-CoV-2/FLU/RSV plus assay is intended as an aid in the diagnosis of influenza from Nasopharyngeal swab specimens and should not be used as a sole basis for treatment. Nasal washings and aspirates are unacceptable for Xpert Xpress SARS-CoV-2/FLU/RSV testing.  Fact Sheet for Patients: BloggerCourse.com  Fact Sheet for Healthcare Providers: SeriousBroker.it  This test is not yet approved or cleared by the [place]  FDA and has been authorized for detection and/or diagnosis of SARS-CoV-2 by FDA under an Emergency Use Authorization (EUA). This EUA will remain in effect (meaning this test can be used) for the duration of the COVID-19 declaration under Section 564(b)(1) of the Act, 21 U.S.C. section 360bbb-3(b)(1), unless the authorization is terminated or revoked.     Resp Syncytial Virus by PCR NEGATIVE NEGATIVE Final    Comment: (NOTE) Fact Sheet for Patients: BloggerCourse.com  Fact Sheet for Healthcare Providers: SeriousBroker.it  This test is not yet approved or cleared by the [place]  FDA and has been authorized for detection and/or diagnosis of SARS-CoV-2 by FDA under an Emergency Use Authorization (EUA). This EUA will remain in effect (meaning this test can be used) for the duration of the COVID-19 declaration under Section 564(b)(1) of the Act, 21 U.S.C. section 360bbb-3(b)(1), unless the authorization is terminated or revoked.  Performed at [facility], [address], [place], [place] [address]   Blood culture (routine x 2)  Status: None (Preliminary result)   Collection Time: 03/14/24  5:20 AM   Specimen: BLOOD  Result Value Ref Range Status   Specimen Description BLOOD BLOOD LEFT WRIST  Final   Special Requests   Final    BOTTLES DRAWN AEROBIC AND ANAEROBIC Blood Culture  adequate volume   Culture   Final    NO GROWTH 2 DAYS Performed at [facility], [address] [place], [place] [address]    Report Status PENDING  Incomplete  MRSA Next Gen by PCR, Nasal     Status: None   Collection Time: 03/15/24 10:27 PM   Specimen: Nasal Mucosa; Nasal Swab  Result Value Ref Range Status   MRSA by PCR Next Gen NOT DETECTED NOT DETECTED Final    Comment: (NOTE) The GeneXpert MRSA Assay (FDA approved for NASAL specimens only), is one component of a comprehensive MRSA colonization surveillance program. It is not intended to diagnose MRSA infection nor to guide or monitor treatment for MRSA infections. Test performance is not FDA approved in patients less than 17 years old. Performed at [facility], [address] [place], [place] [address]     Radiology Studies: MR BRAIN W CONTRAST Result Date: 03/15/2024 EXAM: MRI BRAIN WITH CONTRAST 03/15/2024 01:40:46 PM TECHNIQUE: Multiplanar multisequence MRI of the head/brain was performed with the administration of intravenous contrast. COMPARISON: 03/14/2024 CLINICAL HISTORY: Vasculitis suspected, CNS. Abn mri brain FINDINGS: BRAIN AND VENTRICLES: No acute infarct. No acute intracranial hemorrhage. No mass effect or midline shift. No hydrocephalus. The sella is unremarkable. Normal flow voids. No mass. A few scattered areas of faint contrast enhancement including the left cerebellum, superior left frontal lobe, and superior right frontal lobe. 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 No mass-like enhancement. ORBITS: No acute abnormality. SINUSES: No acute abnormality. BONES AND SOFT TISSUES: Normal bone marrow signal and enhancement. No acute soft tissue abnormality. IMPRESSION: 1. 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 Electronically signed by: Franky Stanford MD 03/15/2024 08:41 PM EDT RP Workstation: [id number]   MR ANGIO HEAD WO CONTRAST Result Date: 03/15/2024 EXAM: MR Angiography Head without intravenous Contrast. 03/15/2024 01:40:46 PM TECHNIQUE: Magnetic resonance angiography images of the head without intravenous contrast. Multiplanar 2D and 3D reformatted images are provided for review. COMPARISON: None provided. CLINICAL HISTORY: Septic arterial embolism. Abn mri brain FINDINGS: ANTERIOR CIRCULATION: The intracranial internal carotid arteries are patent bilaterally. There is mild irregularity of the posterior aspect of the right cavernous ICA which may be related to atherosclerosis. The right posterior communicating artery is visualized. The right M1 segment is patent. There is severe stenosis of a proximal M2 branch of the right MCA seen on series 5 image 132. The left M1 segment is patent. There is additional severe stenosis versus short segment occlusion of a proximal M2 superdivision branch of the left MCA seen on series 5 image 135. The anterior cerebral arteries are patent bilaterally. POSTERIOR CIRCULATION: The intracranial vertebral arteries are patent bilaterally. The basilar artery is patent. The right PCA is patent and appears primarily supplied via the anterior circulation. The left PCA is patent. The superior cerebellar arteries are patent bilaterally. PICA patent bilaterally. IMPRESSION: 1. 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 2. Severe stenosis versus short segment occlusion of a proximal M2 superdivision branch of  the left MCA. 3. These foci of stenosis are relatively symmetric and occur on nearly the same axial slices suggesting that the findings may be related to artifact. There are no additional high-grade stenoses of the intracranial arteries notes. Consider CTA for further evaluation. Electronically signed by: Donnice Mania MD  03/15/2024 03:07 PM EDT RP Workstation: [id number]   MR BRAIN WO CONTRAST Result Date: 03/15/2024 EXAM: MRI BRAIN WITHOUT CONTRAST 03/14/2024 06:01:54 PM TECHNIQUE: Multiplanar multisequence MRI of the head/brain was performed without the administration of intravenous contrast. COMPARISON: None available. CLINICAL HISTORY: Mental status change, unknown cause. FINDINGS: BRAIN AND VENTRICLES: No acute infarct. No intracranial hemorrhage. No mass. No midline shift. No hydrocephalus. The sella is unremarkable. Normal flow voids. There is multifocal hyperintense T2-weighted cortical signal within the bilateral superior cerebral hemispheres. ORBITS: No acute abnormality. SINUSES AND MASTOIDS: Left mastoid effusion. BONES AND SOFT TISSUES: Normal marrow signal. No acute soft tissue abnormality. IMPRESSION: 1. Multifocal hyperintense T2-weighted cortical signal within the bilateral superior cerebral hemispheres. This is a nonspecific finding that may be associated with encephalitides of various etiologies (including infectious and autoimmune). Atypical distribution of PRES is also a possibility. 2. Post-contrast imaging might be helpful. CSF sampling should be considered. Electronically signed by: Franky Stanford MD 03/15/2024 03:53 AM EDT RP Workstation: [id number]   DG Abd 2 Views Result Date: 03/14/2024 CLINICAL DATA:  Vomiting. EXAM: ABDOMEN - 2 VIEW COMPARISON:  None Available. FINDINGS: No bowel dilatation or evidence of obstruction. Air is noted in the colon. No free air or opaque calculus. No acute osseous pathology. IMPRESSION: No evidence of bowel obstruction. Electronically Signed   By: Vanetta Chou M.D.   On: 03/14/2024 19:53   Scheduled Meds:  amLODipine   2.5 mg Oral Daily   Chlorhexidine  Gluconate Cloth  6 each Topical Daily   cloNIDine   0.1 mg Transdermal Weekly   enoxaparin  (LOVENOX ) injection  40 mg Subcutaneous Q24H   labetalol   20 mg Intravenous Once   metoprolol  tartrate  25 mg Oral BID    midazolam   2 mg Intravenous Once   nicotine   21 mg Transdermal Daily   pantoprazole   40 mg Oral QAC breakfast   potassium chloride   40 mEq Oral Q3H   sodium chloride  flush  3 mL Intravenous Q12H   Continuous Infusions:  sodium chloride  125 mL/hr at 03/16/24 9380   acyclovir  (ZOVIRAX ) 500 mg in dextrose  5 % 100 mL IVPB 500 mg (03/16/24 0634)   ampicillin  (OMNIPEN) IV 2 g (03/16/24 1235)   cefTRIAXone  (ROCEPHIN )  IV 2 g (03/16/24 1016)   famotidine  (PEPCID ) IV Stopped (03/15/24 2105)   vancomycin  1,000 mg (03/16/24 0903)    LOS: 2 days   Rendall Carwin M.D on 03/16/2024 at 1:38 PM  Go to [email or web address] - for contact info  [facility] - Office  [phone]  If 7PM-7AM, please contact night-coverage [email or web address] 03/16/2024, 1:38 PM

## 2024-03-16 NOTE — Progress Notes (Addendum)
 I connected with  Sherry [name] on 03/16/24 by a video enabled telemedicine application and verified that I am speaking with the correct person using two identifiers.   I discussed the limitations of evaluation and management by telemedicine. The patient expressed understanding and agreed to proceed.  Location of the provider: [facility] Location of the patient: [facility]   Subjective: NAEO. Much better today. Stated she doesn't remember how she ended up in hospital  ROS: negative except above  Examination  Vital signs in last 24 hours: Temp:  [98 F (36.7 C)-100.5 F (38.1 C)] 98.9 F (37.2 C) (09/05 0730) Pulse Rate:  [81-140] 99 (09/05 0900) Resp:  [19-52] 19 (09/05 0900) BP: (111-184)/(77-116) 167/80 (09/05 0900) SpO2:  [95 %-100 %] 100 % (09/05 0900) Weight:  [61.2 kg] 61.2 kg (09/04 2223)  General: lying in bed, NAD CVS: pulse-normal rate and rhythm RS: breathing comfortably Extremities: normal   Neuro: MS: Alert, oriented, follows commands CN: pupils equal and reactive,  EOMI, face symmetric, tongue midline, normal sensation over face, Motor: Antigravity strength in all 4 extremities Sensory: intact to light touch Coordination: normal Gait: not tested  Basic Metabolic Panel: Recent Labs  Lab 03/14/24 0251 03/15/24 0408 03/15/24 2206 03/16/24 0931  NA 141 133* 134* 136  K 3.1* 3.3* 3.3* 2.9*  CL 99 103 105 106  CO2 24 20* 16* 18*  GLUCOSE 154* 131* 112* 142*  BUN 17 13 12 9   CREATININE 1.07* 0.64 0.52 0.53  CALCIUM 9.6 8.2* 8.0* 8.0*  MG 2.0  --  2.1  --   PHOS 3.4  --   --  1.7*    CBC: Recent Labs  Lab 03/14/24 0251 03/15/24 0408 03/15/24 2206 03/16/24 0931  WBC 28.4* 20.2* 20.1* 14.3*  NEUTROABS 25.6*  --   --  10.0*  HGB 15.7* 15.3* 16.6* 13.8  HCT 45.8 44.6 49.7* 40.6  MCV 94.2 94.1 96.3 94.6  PLT 469* 382 382 247     Coagulation Studies: No results for input(s): LABPROT, INR in the last 72 hours.  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  MRA head wo contrast 03/15/2024: 1. 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  Severe stenosis versus short segment occlusion of a proximal M2 superdivision branch of the left MCA. These foci of stenosis are relatively symmetric and occur on nearly the same axial slices suggesting that the findings may be related to artifact. There are no additional high-grade stenoses of the intracranial arteries notes. Consider CTA for further evaluation.   ASSESSMENT AND PLAN:53 old female with sudden onset of altered mental status and nonverbal.   Acute encephalopathy - Differentials include meningitis/encephalitis versus toxic-metabolic versus seizure.  Based on clinical exam, catatonia is also in the differential, however it should not cause the MRI changes. Quick improvement does raise likelihood of toxic etiology leading to PRES but patient denies any drug use. UDS is negative but isnt comprehensive   Recommendations: - Fluoro guided LP later today, CSF labs ordered - Appreciate ID assistance, continue antibiotics and antivirals for now - if LP looks benign and patient has sustained improvement in mental status, amy not need transfer to [facility] - Discussed with patient  and Fiance at bedside and Dr Pearlean via secure chat   Thank you for allowing us  to participate in the care of this patient. If you have any further questions, please contact  me or neurohospitalist.     I personally spent a total of 36 minutes in the care of the patient today including getting/reviewing separately obtained history, performing a medically appropriate  exam/evaluation, counseling and educating, placing orders, referring and communicating with other health care professionals, documenting clinical information in the EHR, independently interpreting results, and coordinating care.             [facility] For questions after 5pm please refer to AMION to reach the Neurologist on call

## 2024-03-16 NOTE — Plan of Care (Signed)

## 2024-03-16 NOTE — Progress Notes (Signed)
 Arrived to room for offsite EEG. RN need a few with the patient. Will try back as schedule permits

## 2024-03-16 NOTE — Progress Notes (Addendum)
 Brief ID note  #Acute encephalopathy secondary to possibly infectious meningitis/encephalitis. 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  Based on MRI findings and that LP was done on 4 days of broad-spectrum antibiotics will continue meningitis coverage with vancomycin , ceftriaxone  and ampicillin .  If noninfectious etiology is not identified with neurology I think we are going to complete 10 days of IV antibiotics. 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  This would be a rare infection given leftover exposures.  Generally this is diagnosed on brain biopsy.  I do not think we need to pursue biopsy at this point, we will keep it in the differential if mentation does not improve. - Dr. Levell is covering the service this weekend.  New ID service  Monday.

## 2024-03-16 NOTE — Progress Notes (Addendum)
 Progress Note  Rapid response was called due to patient patient having redness with the HR ranging between 130s to 140s and respiratory rate ranging from 38 to 52/min.  Ativan  given prior to activating rapid response did not improve patient's condition.  At bedside, patient was somnolent, but withdraws to sternal rub.  Patient was transferred to the stepdown unit for closer monitoring.     Latest Ref Rng & Units 03/15/2024   10:06 PM 03/15/2024    4:08 AM 03/14/2024    2:51 AM  CBC  WBC 4.0 - 10.5 K/uL 20.1  20.2  28.4   Hemoglobin 12.0 - 15.0 g/dL 83.3  84.6  84.2   Hematocrit 36.0 - 46.0 % 49.7  44.6  45.8   Platelets 150 - 400 K/uL 382  382  469       Latest Ref Rng & Units 03/15/2024   10:06 PM 03/15/2024    4:08 AM 03/14/2024    2:51 AM  BMP  Glucose 70 - 99 mg/dL 887  868  845   BUN 6 - 20 mg/dL 12  13  17    Creatinine 0.44 - 1.00 mg/dL 9.47  9.35  8.92   Sodium 135 - 145 mmol/L 134  133  141   Potassium 3.5 - 5.1 mmol/L 3.3  3.3  3.1   Chloride 98 - 111 mmol/L 105  103  99   CO2 22 - 32 mmol/L 16  20  24    Calcium 8.9 - 10.3 mg/dL 8.0  8.2  9.6     Urine drug screen continues to be positive for benzodiazepine (patient receives Ativan  in the ED), THC  Patient continued to have acute encephalopathy.  She was already evaluated by neurologist during the day and was awaiting transfer to [facility] for further neuro: ID consult and lumbar puncture  EKG personally reviewed showed sinus tachycardia with fusion complexes at a rate of 141 bpm with QTc of 545 ms Avoid QT prolonging drugs Magnesium  was 2.1 Repeat EKG in the morning  She was more awake in SDU and was able to answer a few questions.  Continue to monitor outpatient at this time.  Please refer to admission H&P, consult notes and progress notes for details regarding the care of this patient  Total time:  37 minutes This includes time reviewing the chart including progress notes, labs, EKGs, taking medical decisions, ordering labs and  documenting findings.

## 2024-03-16 NOTE — Plan of Care (Signed)
  Problem: Clinical Measurements: °Goal: Respiratory complications will improve °Outcome: Progressing °  °Problem: Nutrition: °Goal: Adequate nutrition will be maintained °Outcome: Not Progressing °  °

## 2024-03-16 NOTE — Progress Notes (Signed)
 EEG complete - results pending

## 2024-03-17 ENCOUNTER — Inpatient Hospital Stay (HOSPITAL_COMMUNITY): Payer: MEDICAID

## 2024-03-17 DIAGNOSIS — R0609 Other forms of dyspnea: Secondary | ICD-10-CM | POA: Diagnosis not present

## 2024-03-17 DIAGNOSIS — G9340 Encephalopathy, unspecified: Secondary | ICD-10-CM | POA: Diagnosis not present

## 2024-03-17 LAB — ECHOCARDIOGRAM COMPLETE
AR max vel: 2.54 cm2
AV Area VTI: 2.39 cm2
AV Area mean vel: 2.41 cm2
AV Mean grad: 4 mmHg
AV Peak grad: 8.5 mmHg
Ao pk vel: 1.46 m/s
Area-P 1/2: 4.21 cm2
Height: 65 in
S' Lateral: 2.8 cm
Weight: 2158.74 [oz_av]

## 2024-03-17 LAB — GLUCOSE, CAPILLARY
Glucose-Capillary: 115 mg/dL — ABNORMAL HIGH (ref 70–99)
Glucose-Capillary: 115 mg/dL — ABNORMAL HIGH (ref 70–99)

## 2024-03-17 LAB — BASIC METABOLIC PANEL WITH GFR
Anion gap: 8 (ref 5–15)
BUN: 8 mg/dL (ref 6–20)
CO2: 21 mmol/L — ABNORMAL LOW (ref 22–32)
Calcium: 8.1 mg/dL — ABNORMAL LOW (ref 8.9–10.3)
Chloride: 113 mmol/L — ABNORMAL HIGH (ref 98–111)
Creatinine, Ser: 0.55 mg/dL (ref 0.44–1.00)
GFR, Estimated: >60 mL/min (ref >60)
Glucose, Bld: 111 mg/dL — ABNORMAL HIGH (ref 70–99)
Potassium: 3.9 mmol/L (ref 3.5–5.1)
Sodium: 142 mmol/L (ref 135–145)

## 2024-03-17 MED ORDER — CLONAZEPAM 0.5 MG PO TABS
0.5000 mg | ORAL_TABLET | Freq: Two times a day (BID) | ORAL | Status: DC
  Administered 2024-03-17 – 2024-03-19 (×5): 0.5 mg via ORAL
  Filled 2024-03-17 (×5): qty 1

## 2024-03-17 MED ORDER — MELATONIN 3 MG PO TABS
6.0000 mg | ORAL_TABLET | Freq: Once | ORAL | Status: AC
  Administered 2024-03-17: 6 mg via ORAL
  Filled 2024-03-17: qty 2

## 2024-03-17 MED ORDER — ORAL CARE MOUTH RINSE
15.0000 mL | OROMUCOSAL | Status: DC | PRN
Start: 2024-03-17 — End: 2024-03-24

## 2024-03-17 MED ORDER — TRAMADOL HCL 50 MG PO TABS
50.0000 mg | ORAL_TABLET | Freq: Once | ORAL | Status: AC
  Administered 2024-03-17: 50 mg via ORAL
  Filled 2024-03-17: qty 1

## 2024-03-17 NOTE — Progress Notes (Addendum)
 PROGRESS NOTE  Sherry [name], is a 53 y.o. female, DOB - 1970-08-05, FMW:[id number]  Admit date - 03/14/2024   Admitting Physician Shannette Tabares Pearlean, MD  Outpatient Primary MD for the patient is [name], MD  LOS - 3  Chief Complaint  Patient presents with   Altered Mental Status      Brief Narrative:   53 y.o. female with medical history significant for hypertension, hyperlipidemia, depression, anxiety, and COPD who presents with confusion, agitation, cough, and shortness of breath admitted on 03/13/2024 with acute encephalopathy in the setting of recent episodes of emesis and diarrhea,  meets SIRS criteria and elevated lactate  --- Concerns for encephalitis/meningitis--please see MRI brain findings - Had LP on 03/16/2024 -Rescind IVC   -Assessment and Plan: 1) Acute encephalopathy --??  Metabolic in the setting of dehydration and electrolyte derangement or Infectious Given fevers and GI symptoms---Concerns for encephalitis/meningitis - -CT head without acute findings -MRI brain without contrast with possible  encephalitides of various etiologies (including infectious and autoimmune). Atypical distribution of PRES is also a possibility-- Serum ammonia WNL  -UDS with benzos and THC - TSH and B12 WNL -PTA patient was on benzos UDS confirm please avoid abrupt discontinuation of benzos --Infectious disease and neurology consult requested Transferred to [facility]  For lumbar puncture and further neurology and ID consults -MRI head without contrast---without septic emboli -MRI brain with contrast to evaluate for meningitis/encephalitis pending --No rash, no recent travel ,no insect bites -Lumbar puncture with CSF studies requested --Received cefepime  Vanco and Flagyl  initially -Changed to Rocephin  (meningitic doses) and vancomycin  -Acyclovir  added on 03/15/2024, discontinued on 03/17/2023 -ID physician recommends ampicillin /Rocephin /vancomycin --plan will be to treat with meningitis  appropriate antibiotics for total of 10 days Sed rate 3, CRP 0.5 WBC 27.6 >>28.4 >>20.2>>14.3 -Mentation is back to baseline as of 03/16/2024 -Tolerated LP well on 03/16/2024---LP on day 4 of abx ->21 opening pressure, 8 wbc, , 2 rbc, 62 glc, 28 ptn, ME panel negative  -CSF Gram stain negative, CSF culture NGTD, blood cultures NGTD ---- EEG without epileptiform findings --Rescind IVC   2. SIRS dehydration with lactic acidosis--POA - Leukocytosis, tachypnea, and tachycardia present on admission  - Abdomen is soft and no-tender, there is no conspicuous pneumonia on CXR, no bacteriuria or pyuria, no meningismus, no apparent cellulitis  -Blood and CSF cultures NGTD - Antibiotics as above #1   3. AKI -- -acute kidney injury  -   creatinine on admission= 1.07,  baseline creatinine = 0.5-0.6   ,  creatinine is now= 0.6,  -- AKI resolved with IV fluids Renally adjust medications, avoid nephrotoxic agents / dehydration  / hypotension  4.  Recurrent emesis and diarrhea--- abdominal x-ray without obstructive findings or any other acute findings -Send stool for C. difficile and GI pathogen if able -Much improved, tolerating oral intake well  5. Hypokalemia/hyponatremia/hypophosphatemia--due to GI losses in the setting of emesis and diarrhea prior to admission as well as poor oral intake - Sodium normalized with hydration -Continue IV fluids until oral intake is more reliable - Continue to replace electrolyte -Magnesium  WNL  6. Depression/Anxiety  - Restart as needed clonazepam    7. Hypertension  - -MRI brain suggest possible atypical distribution of PRES --BP still not at goal, but overall BP trend improving - c/n topical clonidine  patch  --Anticipate that BP will improve further now that patient is alert and awake and able to take oral medications -- May alternate IV labetalol  and IV hydralazine  as needed  8)COPD -  Not in exacerbation  - Continue short-acting bronchodilators as-needed    Status is: Inpatient   Disposition: The patient is from: Home              Anticipated d/c is to: TBD              Anticipated d/c date is: > 3 days              Patient currently is not medically stable to d/c. Barriers: Not Clinically Stable-   Code Status :  -  Code Status: Full Code   Family Communication:    Discussed with boyfriend of 8 years Sherry [name] at bedside  DVT Prophylaxis  :   - SCDs   enoxaparin  (LOVENOX ) injection 40 mg Start: 03/14/24 2200   Lab Results  Component Value Date   PLT 247 03/16/2024   Inpatient Medications  Scheduled Meds:  amLODipine   2.5 mg Oral Daily   Chlorhexidine  Gluconate Cloth  6 each Topical Daily   cloNIDine   0.1 mg Transdermal Weekly   enoxaparin  (LOVENOX ) injection  40 mg Subcutaneous Q24H   labetalol   20 mg Intravenous Once   metoprolol  tartrate  25 mg Oral BID   midazolam   2 mg Intravenous Once   nicotine   21 mg Transdermal Daily   pantoprazole   40 mg Oral QAC breakfast   sodium chloride  flush  3 mL Intravenous Q12H   Continuous Infusions:  sodium chloride  50 mL/hr at 03/16/24 1957   ampicillin  (OMNIPEN) IV 2 g (03/17/24 0841)   cefTRIAXone  (ROCEPHIN )  IV 2 g (03/17/24 0923)   famotidine  (PEPCID ) IV 20 mg (03/16/24 2012)   vancomycin  1,000 mg (03/17/24 0720)   PRN Meds:.acetaminophen  **OR** acetaminophen , hydrALAZINE , ipratropium-albuterol , LORazepam , trimethobenzamide    Anti-infectives (From admission, onward)    Start     Dose/Rate Route Frequency Ordered Stop   03/15/24 1600  ampicillin  (OMNIPEN) 2 g in sodium chloride  0.9 % 100 mL IVPB        2 g 300 mL/hr over 20 Minutes Intravenous Every 4 hours 03/15/24 1513     03/15/24 1100  acyclovir  (ZOVIRAX ) 500 mg in dextrose  5 % 100 mL IVPB  Status:  Discontinued        500 mg 110 mL/hr over 60 Minutes Intravenous Every 8 hours 03/15/24 0911 03/16/24 1844   03/15/24 1000  cefTRIAXone  (ROCEPHIN ) 2 g in sodium chloride  0.9 % 100 mL IVPB        2 g 200 mL/hr over 30 Minutes  Intravenous Every 12 hours 03/15/24 0858     03/15/24 0800  vancomycin  (VANCOCIN ) IVPB 1000 mg/200 mL premix        1,000 mg 200 mL/hr over 60 Minutes Intravenous Every 24 hours 03/14/24 1107     03/14/24 1600  ceFEPIme  (MAXIPIME ) 2 g in sodium chloride  0.9 % 100 mL IVPB  Status:  Discontinued        2 g 200 mL/hr over 30 Minutes Intravenous Every 12 hours 03/14/24 0647 03/15/24 0858   03/14/24 0800  metroNIDAZOLE  (FLAGYL ) IVPB 500 mg        500 mg 100 mL/hr over 60 Minutes Intravenous Every 12 hours 03/14/24 0639 03/16/24 0906   03/14/24 0647  vancomycin  variable dose per unstable renal function (pharmacist dosing)  Status:  Discontinued         Does not apply See admin instructions 03/14/24 0647 03/14/24 1107   03/14/24 0415  vancomycin  (VANCOCIN ) IVPB 1000 mg/200 mL premix  1,000 mg 200 mL/hr over 60 Minutes Intravenous  Once 03/14/24 0405 03/14/24 0607   03/14/24 0415  ceFEPIme  (MAXIPIME ) 2 g in sodium chloride  0.9 % 100 mL IVPB        2 g 200 mL/hr over 30 Minutes Intravenous  Once 03/14/24 0408 03/14/24 0501        Subjective: Taegan Hauger today has no further emesis,  No chest pain,    --No further fevers - Mentation back to baseline -Eating and drinking well -Ambulating independently   Objective: Vitals:   03/17/24 0438 03/17/24 0735 03/17/24 0800 03/17/24 0900  BP:   (!) 140/96 (!) 142/76  Pulse:   66 73  Resp:   19 (!) 24  Temp: 98.4 F (36.9 C) 98.6 F (37 C)    TempSrc: Oral Oral    SpO2:   99% 97%  Weight:      Height:        Intake/Output Summary (Last 24 hours) at 03/17/2024 1008 Last data filed at 03/16/2024 1552 Gross per 24 hour  Intake 1338.2 ml  Output 400 ml  Net 938.2 ml   Filed Weights   03/14/24 0227 03/15/24 2223  Weight: 51 kg 61.2 kg    Physical Exam Gen:-Alert and oriented, cooperative, no acute distress HEENT:- [place].AT, No sclera icterus, mostly edentulous Neck-Supple Neck,No JVD,.  Lungs-  CTAB , fair symmetrical air  movement CV- S1, S2 normal, regular  Abd-  +ve B.Sounds, Abd Soft, No tenderness,    Extremity/Skin:- No  edema, pedal pulses present , no rash, no obvious insect bites--full skin exam with chaperone and patient's significant other at bedside Psych-affect is appropriate,, alert and oriented x 3, mentation is back to baseline,- Patient is verbal and communicative Neuro--no new focal deficits, no tremors - Neuroexam remains very reassuring  Data Reviewed: I have personally reviewed following labs and imaging studies  CBC: Recent Labs  Lab 03/14/24 0251 03/15/24 0408 03/15/24 2206 03/16/24 0931  WBC 28.4* 20.2* 20.1* 14.3*  NEUTROABS 25.6*  --   --  10.0*  HGB 15.7* 15.3* 16.6* 13.8  HCT 45.8 44.6 49.7* 40.6  MCV 94.2 94.1 96.3 94.6  PLT 469* 382 382 247   Basic Metabolic Panel: Recent Labs  Lab 03/14/24 0251 03/15/24 0408 03/15/24 2206 03/16/24 0931 03/17/24 0354  NA 141 133* 134* 136 142  K 3.1* 3.3* 3.3* 2.9* 3.9  CL 99 103 105 106 113*  CO2 24 20* 16* 18* 21*  GLUCOSE 154* 131* 112* 142* 111*  BUN 17 13 12 9 8   CREATININE 1.07* 0.64 0.52 0.53 0.55  CALCIUM 9.6 8.2* 8.0* 8.0* 8.1*  MG 2.0  --  2.1  --   --   PHOS 3.4  --   --  1.7*  --    GFR: Estimated Creatinine Clearance: 72.3 mL/min (by C-G formula based on SCr of 0.55 mg/dL). Liver Function Tests: Recent Labs  Lab 03/14/24 0250 03/16/24 0931  AST 39  --   ALT 19  --   ALKPHOS 78  --   BILITOT 0.7  --   PROT 6.9  --   ALBUMIN 3.8 3.0*   Recent Results (from the past 240 hours)  Blood culture (routine x 2)     Status: None (Preliminary result)   Collection Time: 03/14/24  2:50 AM   Specimen: BLOOD  Result Value Ref Range Status   Specimen Description BLOOD RIGHT ANTECUBITAL  Final   Special Requests   Final    BOTTLES  DRAWN AEROBIC AND ANAEROBIC Blood Culture results may not be optimal due to an inadequate volume of blood received in culture bottles   Culture   Final    NO GROWTH 3 DAYS Performed  at [facility], [address], [place], [place] [address]    Report Status PENDING  Incomplete  Resp panel by RT-PCR (RSV, Flu A&B, Covid) Anterior Nasal Swab     Status: None   Collection Time: 03/14/24  4:07 AM   Specimen: Anterior Nasal Swab  Result Value Ref Range Status   SARS Coronavirus 2 by RT PCR NEGATIVE NEGATIVE Final    Comment: (NOTE) SARS-CoV-2 target nucleic acids are NOT DETECTED.  The SARS-CoV-2 RNA is generally detectable in upper respiratory specimens during the acute phase of infection. The lowest concentration of SARS-CoV-2 viral copies this assay can detect is 138 copies/mL. A negative result does not preclude SARS-Cov-2 infection and should not be used as the sole basis for treatment or other patient management decisions. A negative result may occur with  improper specimen collection/handling, submission of specimen other than nasopharyngeal swab, presence of viral mutation(s) within the areas targeted by this assay, and inadequate number of viral copies(<138 copies/mL). A negative result must be combined with clinical observations, patient history, and epidemiological information. The expected result is Negative.  Fact Sheet for Patients:  BloggerCourse.com  Fact Sheet for Healthcare Providers:  SeriousBroker.it  This test is no t yet approved or cleared by the [place]  FDA and  has been authorized for detection and/or diagnosis of SARS-CoV-2 by FDA under an Emergency Use Authorization (EUA). This EUA will remain  in effect (meaning this test can be used) for the duration of the COVID-19 declaration under Section 564(b)(1) of the Act, 21 U.S.C.section 360bbb-3(b)(1), unless the authorization is terminated  or revoked sooner.       Influenza A by PCR NEGATIVE NEGATIVE Final   Influenza B by PCR NEGATIVE NEGATIVE Final    Comment: (NOTE) The Xpert Xpress SARS-CoV-2/FLU/RSV plus assay is intended as  an aid in the diagnosis of influenza from Nasopharyngeal swab specimens and should not be used as a sole basis for treatment. Nasal washings and aspirates are unacceptable for Xpert Xpress SARS-CoV-2/FLU/RSV testing.  Fact Sheet for Patients: BloggerCourse.com  Fact Sheet for Healthcare Providers: SeriousBroker.it  This test is not yet approved or cleared by the [place]  FDA and has been authorized for detection and/or diagnosis of SARS-CoV-2 by FDA under an Emergency Use Authorization (EUA). This EUA will remain in effect (meaning this test can be used) for the duration of the COVID-19 declaration under Section 564(b)(1) of the Act, 21 U.S.C. section 360bbb-3(b)(1), unless the authorization is terminated or revoked.     Resp Syncytial Virus by PCR NEGATIVE NEGATIVE Final    Comment: (NOTE) Fact Sheet for Patients: BloggerCourse.com  Fact Sheet for Healthcare Providers: SeriousBroker.it  This test is not yet approved or cleared by the [place]  FDA and has been authorized for detection and/or diagnosis of SARS-CoV-2 by FDA under an Emergency Use Authorization (EUA). This EUA will remain in effect (meaning this test can be used) for the duration of the COVID-19 declaration under Section 564(b)(1) of the Act, 21 U.S.C. section 360bbb-3(b)(1), unless the authorization is terminated or revoked.  Performed at [facility], [address] [place], [place] [address]   Blood culture (routine x 2)     Status: None (Preliminary result)   Collection Time: 03/14/24  5:20 AM   Specimen: BLOOD  Result Value Ref Range Status   Specimen Description BLOOD BLOOD LEFT WRIST  Final   Special Requests   Final    BOTTLES DRAWN AEROBIC AND ANAEROBIC Blood Culture adequate volume   Culture   Final    NO GROWTH 3 DAYS Performed at [facility], [address], [place], [place] [address]     Report Status PENDING  Incomplete  MRSA Next Gen by PCR, Nasal     Status: None   Collection Time: 03/15/24 10:27 PM   Specimen: Nasal Mucosa; Nasal Swab  Result Value Ref Range Status   MRSA by PCR Next Gen NOT DETECTED NOT DETECTED Final    Comment: (NOTE) The GeneXpert MRSA Assay (FDA approved for NASAL specimens only), is one component of a comprehensive MRSA colonization surveillance program. It is not intended to diagnose MRSA infection nor to guide or monitor treatment for MRSA infections. Test performance is not FDA approved in patients less than 54 years old. Performed at [facility], [address], [place], [place] [address]   CSF culture w Gram Stain     Status: None (Preliminary result)   Collection Time: 03/16/24 12:03 PM   Specimen: Lumbar Puncture; Cerebrospinal Fluid  Result Value Ref Range Status   Specimen Description LUMBAR  Final   Special Requests Normal  Final   Gram Stain   Final    NO ORGANISMS SEEN NO WBC SEEN CYTOSPIN SMEAR CSF Gram Stain Report Called to,Read Back By and Verified With: COOK @ 1421 ON [id number] BY HENDERSON L Performed at [facility], [address] [place], [place] [address]    Culture PENDING  Incomplete   Report Status PENDING  Incomplete    Radiology Studies: EEG adult Result Date: 03/16/2024 Shelton Arlin KIDD, MD     03/16/2024  4:47 PM Patient Name: Kameo Bains MRN: [id number] Epilepsy Attending: Arlin KIDD Shelton Referring Physician/Provider: Pearlean Manus, MD Date: 03/16/2024 Duration: 23.49 mins Patient history: 53yo F with ams. EEG to evaluate for seizure Level of alertness: Awake AEDs during EEG study: None Technical aspects: This EEG study was done with scalp electrodes positioned according to the 10-20 International system of electrode placement. Electrical activity was reviewed with band pass filter of 1-70Hz , sensitivity of 7 uV/mm, display speed of 24mm/sec with a 60Hz  notched filter applied as appropriate. EEG data were  recorded continuously and digitally stored.  Video monitoring was available and reviewed as appropriate. Description: The posterior dominant rhythm consists of 8 Hz activity of moderate voltage (25-35 uV) seen predominantly in posterior head regions, symmetric and reactive to eye opening and eye closing. Hyperventilation and photic stimulation were not performed.   IMPRESSION: This study is within normal limits. No seizures or epileptiform discharges were seen throughout the recording. A normal interictal EEG does not exclude the diagnosis of epilepsy. Priyanka KIDD Shelton   DG FL GUIDED LUMBAR PUNCTURE Result Date: 03/16/2024 CLINICAL DATA:  Patient with altered mental status of unknown etiology. Request for lumbar puncture for further evaluation. EXAM: LUMBAR PUNCTURE UNDER FLUOROSCOPY PROCEDURE: An appropriate skin entry site was determined fluoroscopically. Operator donned sterile gloves and mask. Skin site was marked, then prepped with Betadine, draped in usual sterile fashion, and infiltrated locally with 1% lidocaine. A 20 gauge spinal needle advanced into the thecal sac at L4-L5. Clear colorless CSF spontaneously returned, with opening pressure of 21 cm water. Twenty ml CSF were collected and divided among 4 sterile vials for the requested laboratory studies. 12 cm water The needle was then  removed. The patient tolerated the procedure well and there were no complications. FLUOROSCOPY: Radiation Exposure Index (as provided by the fluoroscopic device): 0.8 mGy Kerma IMPRESSION: Technically successful lumbar puncture under fluoroscopy. This exam was performed by Clotilda Hesselbach, PA-C, and was supervised and interpreted by Ester Sides, MD. Electronically Signed   By: Ester Sides M.D.   On: 03/16/2024 14:37   MR BRAIN W CONTRAST Result Date: 03/15/2024 EXAM: MRI BRAIN WITH CONTRAST 03/15/2024 01:40:46 PM TECHNIQUE: Multiplanar multisequence MRI of the head/brain was performed with the administration of  intravenous contrast. COMPARISON: 03/14/2024 CLINICAL HISTORY: Vasculitis suspected, CNS. Abn mri brain FINDINGS: BRAIN AND VENTRICLES: No acute infarct. No acute intracranial hemorrhage. No mass effect or midline shift. No hydrocephalus. The sella is unremarkable. Normal flow voids. No mass. A few scattered areas of faint contrast enhancement including the left cerebellum, superior left frontal lobe, and superior right frontal lobe. 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 No mass-like enhancement. ORBITS: No acute abnormality. SINUSES: No acute abnormality. BONES AND SOFT TISSUES: Normal bone marrow signal and enhancement. No acute soft tissue abnormality. IMPRESSION: 1. 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 Electronically signed by: Franky Stanford MD 03/15/2024 08:41 PM EDT RP Workstation: [id number]   MR ANGIO HEAD WO CONTRAST Result Date: 03/15/2024 EXAM: MR Angiography Head without intravenous Contrast. 03/15/2024 01:40:46 PM TECHNIQUE: Magnetic resonance angiography images of the head without intravenous contrast. Multiplanar 2D and 3D reformatted images are provided for review. COMPARISON: None provided. CLINICAL HISTORY: Septic arterial embolism. Abn mri brain FINDINGS: ANTERIOR CIRCULATION: The intracranial internal carotid arteries are patent bilaterally. There is mild irregularity of the posterior aspect of the right cavernous ICA which may be related to atherosclerosis. The right posterior communicating artery is visualized. The right M1 segment is patent. There is severe stenosis of a proximal M2 branch of the right MCA seen on series 5 image 132. The left M1 segment is patent. There is additional severe stenosis versus short segment  occlusion of a proximal M2 superdivision branch of the left MCA seen on series 5 image 135. The anterior cerebral arteries are patent bilaterally. POSTERIOR CIRCULATION: The intracranial vertebral arteries are patent bilaterally. The basilar artery is patent. The right PCA is patent and appears primarily supplied via the anterior circulation. The left PCA is patent. The superior cerebellar arteries are patent bilaterally. PICA patent bilaterally. IMPRESSION: 1. 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 2. Severe stenosis versus short segment occlusion of a proximal M2 superdivision branch of the left MCA. 3. These foci of stenosis are relatively symmetric and occur on nearly the same axial slices suggesting that the findings may be related to artifact. There are no additional high-grade stenoses of the intracranial arteries notes. Consider CTA for further evaluation. Electronically signed by: Donnice Mania MD 03/15/2024 03:07 PM EDT RP Workstation: [id number]   Scheduled Meds:  amLODipine   2.5 mg Oral Daily   Chlorhexidine  Gluconate Cloth  6 each Topical Daily   cloNIDine   0.1 mg Transdermal Weekly   enoxaparin  (LOVENOX ) injection  40 mg Subcutaneous Q24H   labetalol   20 mg Intravenous Once   metoprolol  tartrate  25 mg Oral BID   midazolam   2 mg Intravenous Once   nicotine   21 mg Transdermal Daily   pantoprazole   40 mg  Oral QAC breakfast   sodium chloride  flush  3 mL Intravenous Q12H   Continuous Infusions:  sodium chloride  50 mL/hr at 03/16/24 1957   ampicillin  (OMNIPEN) IV 2 g (03/17/24 0841)   cefTRIAXone  (ROCEPHIN )  IV 2 g (03/17/24 9076)   famotidine  (PEPCID ) IV 20 mg (03/16/24 2012)   vancomycin  1,000 mg (03/17/24 0720)    LOS: 3 days   Rendall Carwin M.D on 03/17/2024 at 10:08 AM  Go to [email or web address] - for contact info  [facility] - Office  [phone]  If 7PM-7AM, please contact night-coverage [email or web address] 03/17/2024, 10:08 AM

## 2024-03-17 NOTE — Plan of Care (Signed)
  Problem: Education: Goal: Knowledge of General Education information will improve Description: Including pain rating scale, medication(s)/side effects and non-pharmacologic comfort measures Outcome: Progressing   Problem: Health Behavior/Discharge Planning: Goal: Ability to manage health-related needs will improve Outcome: Progressing   Problem: Clinical Measurements: Goal: Ability to maintain clinical measurements within normal limits will improve Outcome: Progressing Goal: Will remain free from infection Outcome: Progressing Goal: Diagnostic test results will improve Outcome: Progressing Goal: Respiratory complications will improve Outcome: Progressing Goal: Cardiovascular complication will be avoided Outcome: Progressing   Problem: Activity: Goal: Risk for activity intolerance will decrease Outcome: Progressing   Problem: Nutrition: Goal: Adequate nutrition will be maintained Outcome: Progressing   Problem: Coping: Goal: Level of anxiety will decrease Outcome: Progressing   Problem: Elimination: Goal: Will not experience complications related to bowel motility Outcome: Progressing Goal: Will not experience complications related to urinary retention Outcome: Progressing   Problem: Pain Managment: Goal: General experience of comfort will improve and/or be controlled Outcome: Progressing   Problem: Safety: Goal: Ability to remain free from injury will improve Outcome: Progressing   Problem: Skin Integrity: Goal: Risk for impaired skin integrity will decrease Outcome: Progressing   Problem: Fluid Volume: Goal: Hemodynamic stability will improve Outcome: Progressing   Problem: Clinical Measurements: Goal: Diagnostic test results will improve Outcome: Progressing Goal: Signs and symptoms of infection will decrease Outcome: Progressing   Problem: Clinical Measurements: Goal: Signs and symptoms of infection will decrease Outcome: Progressing   Problem:  Respiratory: Goal: Ability to maintain adequate ventilation will improve Outcome: Progressing

## 2024-03-18 ENCOUNTER — Other Ambulatory Visit: Payer: Self-pay

## 2024-03-18 DIAGNOSIS — G9340 Encephalopathy, unspecified: Secondary | ICD-10-CM | POA: Diagnosis not present

## 2024-03-18 LAB — CBC
HCT: 34.5 % — ABNORMAL LOW (ref 36.0–46.0)
Hemoglobin: 10.9 g/dL — ABNORMAL LOW (ref 12.0–15.0)
MCH: 31.5 pg (ref 26.0–34.0)
MCHC: 31.6 g/dL (ref 30.0–36.0)
MCV: 99.7 fL (ref 80.0–100.0)
Platelets: 237 K/uL (ref 150–400)
RBC: 3.46 MIL/uL — ABNORMAL LOW (ref 3.87–5.11)
RDW: 14 % (ref 11.5–15.5)
WBC: 7 K/uL (ref 4.0–10.5)
nRBC: 0 % (ref 0.0–0.2)

## 2024-03-18 LAB — RENAL FUNCTION PANEL
Albumin: 2.8 g/dL — ABNORMAL LOW (ref 3.5–5.0)
Anion gap: 8 (ref 5–15)
BUN: 7 mg/dL (ref 6–20)
CO2: 24 mmol/L (ref 22–32)
Calcium: 8.1 mg/dL — ABNORMAL LOW (ref 8.9–10.3)
Chloride: 109 mmol/L (ref 98–111)
Creatinine, Ser: 0.63 mg/dL (ref 0.44–1.00)
GFR, Estimated: >60 mL/min (ref >60)
Glucose, Bld: 106 mg/dL — ABNORMAL HIGH (ref 70–99)
Phosphorus: 3.2 mg/dL (ref 2.5–4.6)
Potassium: 3.1 mmol/L — ABNORMAL LOW (ref 3.5–5.1)
Sodium: 141 mmol/L (ref 135–145)

## 2024-03-18 LAB — VANCOMYCIN, TROUGH: Vancomycin Tr: 6 ug/mL — ABNORMAL LOW (ref 15–20)

## 2024-03-18 MED ORDER — ONDANSETRON HCL 4 MG/2ML IJ SOLN
4.0000 mg | Freq: Four times a day (QID) | INTRAMUSCULAR | Status: DC | PRN
  Administered 2024-03-18: 4 mg via INTRAVENOUS
  Filled 2024-03-18: qty 2

## 2024-03-18 MED ORDER — VANCOMYCIN HCL IN DEXTROSE 1-5 GM/200ML-% IV SOLN
1000.0000 mg | Freq: Two times a day (BID) | INTRAVENOUS | Status: DC
  Administered 2024-03-18 – 2024-03-24 (×13): 1000 mg via INTRAVENOUS
  Filled 2024-03-18 (×12): qty 200

## 2024-03-18 MED ORDER — QUETIAPINE FUMARATE 100 MG PO TABS
400.0000 mg | ORAL_TABLET | Freq: Every day | ORAL | Status: DC
  Administered 2024-03-18 – 2024-03-23 (×6): 400 mg via ORAL
  Filled 2024-03-18 (×6): qty 4

## 2024-03-18 MED ORDER — ZOLPIDEM TARTRATE 5 MG PO TABS
5.0000 mg | ORAL_TABLET | Freq: Every evening | ORAL | Status: DC | PRN
  Administered 2024-03-18: 5 mg via ORAL
  Filled 2024-03-18: qty 1

## 2024-03-18 MED ORDER — VANCOMYCIN HCL IN DEXTROSE 1-5 GM/200ML-% IV SOLN: 1000.0000 mg | Freq: Two times a day (BID) | INTRAVENOUS | Status: DC

## 2024-03-18 MED ORDER — POTASSIUM CHLORIDE CRYS ER 20 MEQ PO TBCR
40.0000 meq | EXTENDED_RELEASE_TABLET | ORAL | Status: AC
  Administered 2024-03-18 (×2): 40 meq via ORAL
  Filled 2024-03-18 (×2): qty 2

## 2024-03-18 NOTE — Progress Notes (Signed)
 Pharmacy Antibiotic Note  Sherry [name] is a 53 y.o. female admitted on 03/14/2024 with altered mental status.  Pharmacy has been consulted for Vancomycin  dosing. 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 AF , PCT <0.1 WBC WNL  LP done after abx started. 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 Plan is for 10 days of IV abx VT = 6mcg/ml, below goal. Will adjust dose  Plan: Increase Vancomycin  1gm IV q12h Continue Ceftriaxone  2g q12 per MD Continue Ampicillin  2gm IV q4h F/U cxs and clinical progress Monitor V/S, labs and levels as indicated  Height: 5' 5 (165.1 cm) Weight: 60.6 kg (133 lb 9.6 oz) IBW/kg (Calculated) : 57  Temp (24hrs), Avg:98.7 F (37.1 C), Min:98.3 F (36.8 C), Max:98.8 F (37.1 C)  Recent Labs  Lab 03/14/24 0250 03/14/24 0251 03/14/24 0251 03/14/24 0521 03/14/24 0926 03/15/24 0408 03/15/24 2206 03/16/24 0931 03/17/24 0354 03/18/24 0344  WBC  --  28.4*  --   --   --  20.2* 20.1* 14.3*  --  7.0  CREATININE  --  1.07*   < >  --   --  0.64 0.52 0.53 0.55 0.63  LATICACIDVEN 6.1*  --   --  2.6* 1.3  --  1.0  --   --   --   VANCOTROUGH  --   --   --   --   --   --   --   --   --  6*   < > = values in this interval not displayed.    Estimated Creatinine Clearance: 72.3 mL/min (by C-G formula based on SCr of 0.63 mg/dL).    Allergies  Allergen Reactions   Dramamine [Dimenhydrinate] Swelling and Other (See Comments)    Made her feel really drunk. Swelling of tongue     Prednisone  Other (See Comments)    Feels like fingers are swelling    Antimicrobials this admission: Vanc 9/3> Cefepime  9/3 Ceftriaxone  9/4 (meningitis dosing)> Ampicillin  9/4>> Acyclovir  9/4> 9/5 Flagyl  9/3> 9/5  Microbiology results: 9/5 CSF: ngtd 9/3 bldx2: ngtd 9/4 MRSA PCR neg   Thank you for allowing pharmacy to be a part of this patient's care.  Lauren Aguayo, BS  Pharm D, BCPS Clinical Pharmacist 03/18/2024 9:24 AM

## 2024-03-18 NOTE — Plan of Care (Signed)

## 2024-03-18 NOTE — Progress Notes (Signed)
 PROGRESS NOTE  Sherry [name], is a 54 y.o. female, DOB - 17-Sep-1970, FMW:[id number]  Admit date - 03/14/2024   Admitting Physician Harbor Vanover Pearlean, MD  Outpatient Primary MD for the patient is [name], MD  LOS - 4  Chief Complaint  Patient presents with   Altered Mental Status      Brief Narrative:   53 y.o. female with medical history significant for hypertension, hyperlipidemia, depression, anxiety, and COPD who presents with confusion, agitation, cough, and shortness of breath admitted on 03/13/2024 with acute encephalopathy in the setting of recent episodes of emesis and diarrhea,  meets SIRS criteria and elevated lactate  --- Concerns for encephalitis/meningitis--please see MRI brain findings - Had LP on 03/16/2024---studies mostly negative -Rescinded IVC on 03/17/24 -- -Disposition--as of 03/18/2024 patient is medically ready for discharge home with OPAT for IV Rocephin /IV vancomycin /IV ampicillin  and meningitic doses as recommended by ID physicians--- however patient has no insurance and no ability to pay for home infusion of above antibiotics -- She will stay in the hospital to complete IV antibiotics last dose of IV antibiotics will be 03/23/2024   -Assessment and Plan: 1) Acute Encephalopathy --??  Metabolic in the setting of dehydration and electrolyte derangement or Infectious Given fevers and GI symptoms---Concerns for encephalitis/meningitis - -CT head without acute findings -MRI brain without contrast with possible  encephalitides of various etiologies (including infectious and autoimmune). Atypical distribution of PRES is also a possibility-- Serum ammonia WNL  -UDS with benzos and THC - TSH and B12 WNL -PTA patient was on benzos UDS confirm please avoid abrupt discontinuation of benzos --Infectious disease and neurology consult requested Transferred to [facility]  For lumbar puncture and further neurology and ID consults -MRI head without contrast---without  septic emboli -MRI brain with contrast to evaluate for meningitis/encephalitis pending --No rash, no recent travel ,no insect bites -Lumbar puncture with CSF studies requested --Received cefepime  Vanco and Flagyl  initially -Changed to Rocephin  (meningitic doses) and vancomycin  -Acyclovir  added on 03/15/2024, discontinued on 03/17/2023 -ID physician recommends ampicillin /Rocephin /vancomycin --plan will be to treat with meningitis appropriate antibiotics for total of 10 days Sed rate 3, CRP 0.5 WBC 27.6 >>28.4 >>20.2>>14.3>>7.0 -Mentation is back to baseline as of 03/16/2024 -Tolerated LP well on 03/16/2024---LP on day 4 of abx ->21 opening pressure, 8 wbc, , 2 rbc, 62 glc, 28 ptn, ME panel negative  -CSF Gram stain negative, CSF culture NGTD, blood cultures NGTD ---- EEG without epileptiform findings --Rescinded IVC on 03/17/24   2)SIRS dehydration with lactic acidosis--POA - Leukocytosis, tachypnea, and tachycardia present on admission  - Abdomen is soft and no-tender, there is no conspicuous pneumonia on CXR, no bacteriuria or pyuria, no meningismus, no apparent cellulitis  -Blood and CSF cultures NGTD SIRs pathophysiology has resolved - Antibiotics as above #1   3. AKI -- -acute kidney injury  -   creatinine on admission= 1.07,  baseline creatinine = 0.5-0.6   ,  creatinine is now= 0.6,  -- AKI resolved with IV fluids Renally adjust medications, avoid nephrotoxic agents / dehydration  / hypotension  4.  Recurrent emesis and diarrhea--- abdominal x-ray without obstructive findings or any other acute findings -Send stool for C. difficile and GI pathogen if able -Much improved, tolerating oral intake well  5. Hypokalemia/hyponatremia/hypophosphatemia--due to GI losses in the setting of emesis and diarrhea prior to admission as well as poor oral intake - Sodium normalized with hydration -Continue IV fluids until oral intake is more reliable - Continue to replace electrolyte -Magnesium   WNL  6. Depression/Anxiety  - Restart as needed clonazepam    7. Hypertension  - -MRI brain suggest possible atypical distribution of PRES --BP still not at goal, but overall BP trend improving - c/n topical clonidine  patch  --Anticipate that BP will improve further now that patient is alert and awake and able to take oral medications -- May alternate IV labetalol  and IV hydralazine  as needed  8)COPD - Not in exacerbation  - Continue short-acting bronchodilators as-needed   9) chronic anemia--Hgb down to 10.9, Hgb was high on admission due to hemoconcentration - Hgb is  down now due to hemodilution from IV fluids - No obvious bleeding - Prior history of anemia noted  Disposition--as of 03/18/2024 patient is medically ready for discharge home with OPAT for IV Rocephin /IV vancomycin /IV ampicillin  and meningitic doses as recommended by ID physicians--- however patient has no insurance and no ability to pay for home infusion of above antibiotics -- She will stay in the hospital to complete IV antibiotics last dose of IV antibiotics will be 03/23/2024  Status is: Inpatient   Disposition: The patient is from: Home              Anticipated d/c is to: TBD              Anticipated d/c date is: > 3 days              Patient currently is not medically stable to d/c. Barriers: Not Clinically Stable-   Code Status :  -  Code Status: Full Code   Family Communication:    Discussed with boyfriend of 8 years Tim at bedside  DVT Prophylaxis  :   - SCDs   enoxaparin  (LOVENOX ) injection 40 mg Start: 03/14/24 2200   Lab Results  Component Value Date   PLT 237 03/18/2024   Inpatient Medications  Scheduled Meds:  clonazePAM   0.5 mg Oral BID   cloNIDine   0.1 mg Transdermal Weekly   enoxaparin  (LOVENOX ) injection  40 mg Subcutaneous Q24H   labetalol   20 mg Intravenous Once   metoprolol  tartrate  25 mg Oral BID   nicotine   21 mg Transdermal Daily   pantoprazole   40 mg Oral QAC breakfast    QUEtiapine   400 mg Oral QHS   sodium chloride  flush  3 mL Intravenous Q12H   Continuous Infusions:  sodium chloride  10 mL/hr at 03/18/24 1618   ampicillin  (OMNIPEN) IV 2 g (03/18/24 1552)   cefTRIAXone  (ROCEPHIN )  IV 2 g (03/18/24 0941)   vancomycin  1,000 mg (03/18/24 1019)   PRN Meds:.acetaminophen  **OR** acetaminophen , hydrALAZINE , ipratropium-albuterol , ondansetron  (ZOFRAN ) IV, mouth rinse, trimethobenzamide , zolpidem    Anti-infectives (From admission, onward)    Start     Dose/Rate Route Frequency Ordered Stop   03/18/24 2000  vancomycin  (VANCOCIN ) IVPB 1000 mg/200 mL premix  Status:  Discontinued        1,000 mg 200 mL/hr over 60 Minutes Intravenous Every 12 hours 03/18/24 0952 03/18/24 1003   03/18/24 1030  vancomycin  (VANCOCIN ) IVPB 1000 mg/200 mL premix        1,000 mg 200 mL/hr over 60 Minutes Intravenous Every 12 hours 03/18/24 1003     03/15/24 1600  ampicillin  (OMNIPEN) 2 g in sodium chloride  0.9 % 100 mL IVPB        2 g 300 mL/hr over 20 Minutes Intravenous Every 4 hours 03/15/24 1513     03/15/24 1100  acyclovir  (ZOVIRAX ) 500 mg in dextrose  5 % 100 mL IVPB  Status:  Discontinued        500 mg 110 mL/hr over 60 Minutes Intravenous Every 8 hours 03/15/24 0911 03/16/24 1844   03/15/24 1000  cefTRIAXone  (ROCEPHIN ) 2 g in sodium chloride  0.9 % 100 mL IVPB        2 g 200 mL/hr over 30 Minutes Intravenous Every 12 hours 03/15/24 0858     03/15/24 0800  vancomycin  (VANCOCIN ) IVPB 1000 mg/200 mL premix  Status:  Discontinued        1,000 mg 200 mL/hr over 60 Minutes Intravenous Every 24 hours 03/14/24 1107 03/18/24 0952   03/14/24 1600  ceFEPIme  (MAXIPIME ) 2 g in sodium chloride  0.9 % 100 mL IVPB  Status:  Discontinued        2 g 200 mL/hr over 30 Minutes Intravenous Every 12 hours 03/14/24 0647 03/15/24 0858   03/14/24 0800  metroNIDAZOLE  (FLAGYL ) IVPB 500 mg        500 mg 100 mL/hr over 60 Minutes Intravenous Every 12 hours 03/14/24 0639 03/16/24 0906   03/14/24 0647   vancomycin  variable dose per unstable renal function (pharmacist dosing)  Status:  Discontinued         Does not apply See admin instructions 03/14/24 0647 03/14/24 1107   03/14/24 0415  vancomycin  (VANCOCIN ) IVPB 1000 mg/200 mL premix        1,000 mg 200 mL/hr over 60 Minutes Intravenous  Once 03/14/24 0405 03/14/24 0607   03/14/24 0415  ceFEPIme  (MAXIPIME ) 2 g in sodium chloride  0.9 % 100 mL IVPB        2 g 200 mL/hr over 30 Minutes Intravenous  Once 03/14/24 0408 03/14/24 0501        Subjective: Aundraya Henion today has no further emesis,  No chest pain,    --No further fevers - Mentation back to baseline -Eating and drinking well -Ambulating independently in hallways - Boyfriend at bedside, questions were answered - Patient disappointed that she cannot go home at this time due to lack of insurance coverage to pay for home health antibiotic administration   Objective: Vitals:   03/17/24 2005 03/18/24 0330 03/18/24 0500 03/18/24 1334  BP: 127/75 138/79  115/69  Pulse: 66 (!) 52  71  Resp: 20 20  17   Temp: 98.8 F (37.1 C) 98.8 F (37.1 C)  98 F (36.7 C)  TempSrc: Oral Oral  Oral  SpO2: 99% 98%  100%  Weight:   60.6 kg   Height:        Intake/Output Summary (Last 24 hours) at 03/18/2024 1619 Last data filed at 03/18/2024 0936 Gross per 24 hour  Intake 610 ml  Output --  Net 610 ml   Filed Weights   03/14/24 0227 03/15/24 2223 03/18/24 0500  Weight: 51 kg 61.2 kg 60.6 kg    Physical Exam Gen:-Alert and oriented, cooperative, no acute distress HEENT:- [place].AT, No sclera icterus, mostly edentulous Neck-Supple Neck,No JVD,.  Lungs-  CTAB , fair symmetrical air movement CV- S1, S2 normal, regular  Abd-  +ve B.Sounds, Abd Soft, No tenderness,    Extremity/Skin:- No  edema, pedal pulses present , no rash, no obvious insect bites--full skin exam with chaperone and patient's significant other at bedside Psych-affect is appropriate,, alert and oriented x 3, mentation is back  to baseline,- Patient is verbal and communicative Neuro--no new focal deficits, no tremors - Neuroexam remains very reassuring  Data Reviewed: I have personally reviewed following labs and imaging studies  CBC: Recent Labs  Lab 03/14/24  9748 03/15/24 0408 03/15/24 2206 03/16/24 0931 03/18/24 0344  WBC 28.4* 20.2* 20.1* 14.3* 7.0  NEUTROABS 25.6*  --   --  10.0*  --   HGB 15.7* 15.3* 16.6* 13.8 10.9*  HCT 45.8 44.6 49.7* 40.6 34.5*  MCV 94.2 94.1 96.3 94.6 99.7  PLT 469* 382 382 247 237   Basic Metabolic Panel: Recent Labs  Lab 03/14/24 0251 03/15/24 0408 03/15/24 2206 03/16/24 0931 03/17/24 0354 03/18/24 0344  NA 141 133* 134* 136 142 141  K 3.1* 3.3* 3.3* 2.9* 3.9 3.1*  CL 99 103 105 106 113* 109  CO2 24 20* 16* 18* 21* 24  GLUCOSE 154* 131* 112* 142* 111* 106*  BUN 17 13 12 9 8 7   CREATININE 1.07* 0.64 0.52 0.53 0.55 0.63  CALCIUM 9.6 8.2* 8.0* 8.0* 8.1* 8.1*  MG 2.0  --  2.1  --   --   --   PHOS 3.4  --   --  1.7*  --  3.2   GFR: Estimated Creatinine Clearance: 72.3 mL/min (by C-G formula based on SCr of 0.63 mg/dL). Liver Function Tests: Recent Labs  Lab 03/14/24 0250 03/16/24 0931 03/18/24 0344  AST 39  --   --   ALT 19  --   --   ALKPHOS 78  --   --   BILITOT 0.7  --   --   PROT 6.9  --   --   ALBUMIN 3.8 3.0* 2.8*   Recent Results (from the past 240 hours)  Blood culture (routine x 2)     Status: None (Preliminary result)   Collection Time: 03/14/24  2:50 AM   Specimen: BLOOD  Result Value Ref Range Status   Specimen Description BLOOD RIGHT ANTECUBITAL  Final   Special Requests   Final    BOTTLES DRAWN AEROBIC AND ANAEROBIC Blood Culture results may not be optimal due to an inadequate volume of blood received in culture bottles   Culture   Final    NO GROWTH 4 DAYS Performed at [facility], [address], [place], [place] [address]    Report Status PENDING  Incomplete  Resp panel by RT-PCR (RSV, Flu A&B, Covid) Anterior Nasal Swab      Status: None   Collection Time: 03/14/24  4:07 AM   Specimen: Anterior Nasal Swab  Result Value Ref Range Status   SARS Coronavirus 2 by RT PCR NEGATIVE NEGATIVE Final    Comment: (NOTE) SARS-CoV-2 target nucleic acids are NOT DETECTED.  The SARS-CoV-2 RNA is generally detectable in upper respiratory specimens during the acute phase of infection. The lowest concentration of SARS-CoV-2 viral copies this assay can detect is 138 copies/mL. A negative result does not preclude SARS-Cov-2 infection and should not be used as the sole basis for treatment or other patient management decisions. A negative result may occur with  improper specimen collection/handling, submission of specimen other than nasopharyngeal swab, presence of viral mutation(s) within the areas targeted by this assay, and inadequate number of viral copies(<138 copies/mL). A negative result must be combined with clinical observations, patient history, and epidemiological information. The expected result is Negative.  Fact Sheet for Patients:  BloggerCourse.com  Fact Sheet for Healthcare Providers:  SeriousBroker.it  This test is no t yet approved or cleared by the [place]  FDA and  has been authorized for detection and/or diagnosis of SARS-CoV-2 by FDA under an Emergency Use Authorization (EUA). This EUA will remain  in effect (meaning this test can be used)  for the duration of the COVID-19 declaration under Section 564(b)(1) of the Act, 21 U.S.C.section 360bbb-3(b)(1), unless the authorization is terminated  or revoked sooner.       Influenza A by PCR NEGATIVE NEGATIVE Final   Influenza B by PCR NEGATIVE NEGATIVE Final    Comment: (NOTE) The Xpert Xpress SARS-CoV-2/FLU/RSV plus assay is intended as an aid in the diagnosis of influenza from Nasopharyngeal swab specimens and should not be used as a sole basis for treatment. Nasal washings and aspirates are  unacceptable for Xpert Xpress SARS-CoV-2/FLU/RSV testing.  Fact Sheet for Patients: BloggerCourse.com  Fact Sheet for Healthcare Providers: SeriousBroker.it  This test is not yet approved or cleared by the [place]  FDA and has been authorized for detection and/or diagnosis of SARS-CoV-2 by FDA under an Emergency Use Authorization (EUA). This EUA will remain in effect (meaning this test can be used) for the duration of the COVID-19 declaration under Section 564(b)(1) of the Act, 21 U.S.C. section 360bbb-3(b)(1), unless the authorization is terminated or revoked.     Resp Syncytial Virus by PCR NEGATIVE NEGATIVE Final    Comment: (NOTE) Fact Sheet for Patients: BloggerCourse.com  Fact Sheet for Healthcare Providers: SeriousBroker.it  This test is not yet approved or cleared by the [place]  FDA and has been authorized for detection and/or diagnosis of SARS-CoV-2 by FDA under an Emergency Use Authorization (EUA). This EUA will remain in effect (meaning this test can be used) for the duration of the COVID-19 declaration under Section 564(b)(1) of the Act, 21 U.S.C. section 360bbb-3(b)(1), unless the authorization is terminated or revoked.  Performed at [facility], [address] [place], [place] [address]   Blood culture (routine x 2)     Status: None (Preliminary result)   Collection Time: 03/14/24  5:20 AM   Specimen: BLOOD  Result Value Ref Range Status   Specimen Description BLOOD BLOOD LEFT WRIST  Final   Special Requests   Final    BOTTLES DRAWN AEROBIC AND ANAEROBIC Blood Culture adequate volume   Culture   Final    NO GROWTH 4 DAYS Performed at [facility], [address], [place], [place] [address]    Report Status PENDING  Incomplete  MRSA Next Gen by PCR, Nasal     Status: None   Collection Time: 03/15/24 10:27 PM   Specimen: Nasal Mucosa; Nasal Swab   Result Value Ref Range Status   MRSA by PCR Next Gen NOT DETECTED NOT DETECTED Final    Comment: (NOTE) The GeneXpert MRSA Assay (FDA approved for NASAL specimens only), is one component of a comprehensive MRSA colonization surveillance program. It is not intended to diagnose MRSA infection nor to guide or monitor treatment for MRSA infections. Test performance is not FDA approved in patients less than 52 years old. Performed at [facility], [address] [place], [place] [address]   CSF culture w Gram Stain     Status: None (Preliminary result)   Collection Time: 03/16/24 12:03 PM   Specimen: Lumbar Puncture; Cerebrospinal Fluid  Result Value Ref Range Status   Specimen Description   Final    LUMBAR Performed at [facility], [address] [place], [place] [address]    Special Requests   Final    Normal Performed at [facility], [address], [place], [place] [address]    Gram Stain   Final    NO ORGANISMS SEEN NO WBC SEEN CYTOSPIN SMEAR CSF Gram Stain Report Called to,Read Back By and Verified  With: COOK @ 1421 ON [id number] BY HENDERSON L Performed at [facility], [address], [place], [place] [address]    Culture   Final    NO GROWTH 2 DAYS Performed at [facility], [address], [place], [place] [address]    Report Status PENDING  Incomplete  Fungus Culture Without Stain     Status: None (Preliminary result)   Collection Time: 03/16/24 12:03 PM   Specimen: CSF; Other  Result Value Ref Range Status   Specimen Description   Final    CSF Performed at [facility], [address], [place], [place] [address]    Special Requests   Final    NONE Performed at [facility], [address] [place], [place] [address]    Culture   Final    NO FUNGUS ISOLATED AFTER 2 DAYS Performed at [facility], [address] [place], [place] [address]    Report Status PENDING  Incomplete    Radiology Studies: US  EKG SITE RITE Result Date: 03/18/2024 If Site Rite  image not attached, placement could not be confirmed due to current cardiac rhythm.  ECHOCARDIOGRAM COMPLETE Result Date: 03/17/2024    ECHOCARDIOGRAM REPORT   Patient Name:   QUIANNA AVERY Date of Exam: 03/17/2024 Medical Rec #:  [id number]  Height:       65.0 in Accession #:    [id number] Weight:       134.9 lb Date of Birth:  1970-09-08  BSA:          1.673 m Patient Age:    53 years   BP:           142/76 mmHg Patient Gender: F          HR:           60 bpm. Exam Location:  [facility] Procedure: 2D Echo, 3D Echo, Cardiac Doppler, Color Doppler and Strain Analysis            (Both Spectral and Color Flow Doppler were utilized during            procedure). Indications:    Dyspnea  History:        Patient has no prior history of Echocardiogram examinations.                 COPD; Risk Factors:Hypertension.  Sonographer:    Philomena Daring Referring Phys: [id number] Narjis Mira  Sonographer Comments: Global longitudinal strain was attempted. IMPRESSIONS  1. Left ventricular ejection fraction, by estimation, is 60 to 65%. Left ventricular ejection fraction by 3D volume is 62 %. The left ventricle has normal function. The left ventricle has no regional wall motion abnormalities. Left ventricular diastolic  parameters are consistent with Grade I diastolic dysfunction (impaired relaxation). The average left ventricular global longitudinal strain is -19.5 %. The global longitudinal strain is normal.  2. Right ventricular systolic function is normal. The right ventricular size is normal. There is normal pulmonary artery systolic pressure.  3. The mitral valve is normal in structure. Trivial mitral valve regurgitation. No evidence of mitral stenosis.  4. The aortic valve is tricuspid. Aortic valve regurgitation is not visualized. No aortic stenosis is present.  5. The inferior vena cava is dilated in size with >50% respiratory variability, suggesting right atrial pressure of 8 mmHg. Comparison(s): No prior Echocardiogram. FINDINGS   Left Ventricle: Left ventricular ejection fraction, by estimation, is 60 to 65%. Left ventricular ejection fraction by 3D volume is 62 %.  The left ventricle has normal function. The left ventricle has no regional wall motion abnormalities. The average left ventricular global longitudinal strain is -19.5 %. Strain was performed and the global longitudinal strain is normal. The left ventricular internal cavity size was normal in size. There is no left ventricular hypertrophy. Left ventricular diastolic parameters are consistent with Grade I diastolic dysfunction (impaired relaxation). Normal left ventricular filling pressure. Right Ventricle: The right ventricular size is normal. No increase in right ventricular wall thickness. Right ventricular systolic function is normal. There is normal pulmonary artery systolic pressure. The tricuspid regurgitant velocity is 1.46 m/s, and  with an assumed right atrial pressure of 8 mmHg, the estimated right ventricular systolic pressure is 16.5 mmHg. Left Atrium: Left atrial size was normal in size. Right Atrium: Right atrial size was normal in size. Pericardium: There is no evidence of pericardial effusion. Mitral Valve: The mitral valve is normal in structure. Trivial mitral valve regurgitation. No evidence of mitral valve stenosis. Tricuspid Valve: The tricuspid valve is normal in structure. Tricuspid valve regurgitation is trivial. No evidence of tricuspid stenosis. Aortic Valve: The aortic valve is tricuspid. Aortic valve regurgitation is not visualized. No aortic stenosis is present. Aortic valve mean gradient measures 4.0 mmHg. Aortic valve peak gradient measures 8.5 mmHg. Aortic valve area, by VTI measures 2.39 cm. Pulmonic Valve: The pulmonic valve was not well visualized. Pulmonic valve regurgitation is not visualized. No evidence of pulmonic stenosis. Aorta: The aortic root and ascending aorta are structurally normal, with no evidence of dilitation. Venous: The  inferior vena cava is dilated in size with greater than 50% respiratory variability, suggesting right atrial pressure of 8 mmHg. IAS/Shunts: No atrial level shunt detected by color flow Doppler. Additional Comments: 3D was performed not requiring image post processing on an independent workstation and was normal.  LEFT VENTRICLE PLAX 2D LVIDd:         4.60 cm         Diastology LVIDs:         2.80 cm         LV e' medial:    10.10 cm/s LV PW:         0.70 cm         LV E/e' medial:  9.3 LV IVS:        0.70 cm         LV e' lateral:   10.90 cm/s LVOT diam:     2.00 cm         LV E/e' lateral: 8.7 LV SV:         76 LV SV Index:   45              2D Longitudinal LVOT Area:     3.14 cm        Strain                                2D Strain GLS   -19.5 %                                Avg:                                 3D Volume EF  LV 3D EF:    Left                                             ventricul                                             ar                                             ejection                                             fraction                                             by 3D                                             volume is                                             62 %.                                 3D Volume EF:                                3D EF:        62 %                                LV EDV:       111 ml                                LV ESV:       42 ml                                LV SV:        68 ml RIGHT VENTRICLE             IVC RV S prime:     13.40 cm/s  IVC diam: 2.10 cm TAPSE (M-mode): 2.4 cm LEFT ATRIUM             Index        RIGHT ATRIUM           Index LA diam:  2.80 cm 1.67 cm/m   RA Area:     12.40 cm LA Vol (A2C):   47.2 ml 28.21 ml/m  RA Volume:   28.20 ml  16.85 ml/m LA Vol (A4C):   36.6 ml 21.87 ml/m LA Biplane Vol: 41.4 ml 24.74 ml/m  AORTIC VALVE AV Area (Vmax):    2.54 cm AV Area (Vmean):   2.41 cm AV  Area (VTI):     2.39 cm AV Vmax:           146.00 cm/s AV Vmean:          95.100 cm/s AV VTI:            0.317 m AV Peak Grad:      8.5 mmHg AV Mean Grad:      4.0 mmHg LVOT Vmax:         118.00 cm/s LVOT Vmean:        73.100 cm/s LVOT VTI:          0.241 m LVOT/AV VTI ratio: 0.76  AORTA Ao Root diam: 3.00 cm Ao Asc diam:  3.10 cm MITRAL VALVE               TRICUSPID VALVE MV Area (PHT): 4.21 cm    TR Peak grad:   8.5 mmHg MV Decel Time: 180 msec    TR Vmax:        146.00 cm/s MV E velocity: 94.40 cm/s MV A velocity: 98.50 cm/s  SHUNTS MV E/A ratio:  0.96        Systemic VTI:  0.24 m                            Systemic Diam: 2.00 cm Vishnu Priya Mallipeddi Electronically signed by Diannah Arleta Maywood Signature Date/Time: 03/17/2024/11:40:42 AM    Final    EEG adult Result Date: 03/16/2024 Shelton Arlin KIDD, MD     03/16/2024  4:47 PM Patient Name: Aaylah Pokorny MRN: [id number] Epilepsy Attending: Arlin KIDD Shelton Referring Physician/Provider: Pearlean Manus, MD Date: 03/16/2024 Duration: 23.49 mins Patient history: 53yo F with ams. EEG to evaluate for seizure Level of alertness: Awake AEDs during EEG study: None Technical aspects: This EEG study was done with scalp electrodes positioned according to the 10-20 International system of electrode placement. Electrical activity was reviewed with band pass filter of 1-70Hz , sensitivity of 7 uV/mm, display speed of 83mm/sec with a 60Hz  notched filter applied as appropriate. EEG data were recorded continuously and digitally stored.  Video monitoring was available and reviewed as appropriate. Description: The posterior dominant rhythm consists of 8 Hz activity of moderate voltage (25-35 uV) seen predominantly in posterior head regions, symmetric and reactive to eye opening and eye closing. Hyperventilation and photic stimulation were not performed.   IMPRESSION: This study is within normal limits. No seizures or epileptiform discharges were seen throughout the recording. A  normal interictal EEG does not exclude the diagnosis of epilepsy. Priyanka O Yadav   Scheduled Meds:  clonazePAM   0.5 mg Oral BID   cloNIDine   0.1 mg Transdermal Weekly   enoxaparin  (LOVENOX ) injection  40 mg Subcutaneous Q24H   labetalol   20 mg Intravenous Once   metoprolol  tartrate  25 mg Oral BID   nicotine   21 mg Transdermal Daily   pantoprazole   40 mg Oral QAC breakfast   QUEtiapine   400 mg Oral QHS   sodium chloride  flush  3 mL Intravenous Q12H   Continuous  Infusions:  sodium chloride  10 mL/hr at 03/18/24 1618   ampicillin  (OMNIPEN) IV 2 g (03/18/24 1552)   cefTRIAXone  (ROCEPHIN )  IV 2 g (03/18/24 0941)   vancomycin  1,000 mg (03/18/24 1019)    LOS: 4 days   Rendall Carwin M.D on 03/18/2024 at 4:19 PM  Go to [email or web address] - for contact info  [facility] - Office  [phone]  If 7PM-7AM, please contact night-coverage [email or web address] 03/18/2024, 4:19 PM

## 2024-03-18 NOTE — Progress Notes (Signed)
 Secure chat with multiple care team members re PICC placement today for dc home.  Unable to set up for today.  Dr Pearlean requested to hold off on PICC placement today.

## 2024-03-18 NOTE — Plan of Care (Signed)
   Problem: Education: Goal: Knowledge of General Education information will improve Description: Including pain rating scale, medication(s)/side effects and non-pharmacologic comfort measures Outcome: Progressing   Problem: Clinical Measurements: Goal: Ability to maintain clinical measurements within normal limits will improve Outcome: Progressing Goal: Diagnostic test results will improve Outcome: Progressing

## 2024-03-18 NOTE — TOC CM/SW Note (Signed)
 Initial dc plan was to dc pt c/a PICC and home IV abx and RN referral. Pt needs three abx, two are q12hr and one is continuous. However, pt's insurance is inactive. Her medicaid status is potential.  Pt will remain here until she finishes IV abx.

## 2024-03-19 DIAGNOSIS — G9340 Encephalopathy, unspecified: Secondary | ICD-10-CM | POA: Diagnosis not present

## 2024-03-19 LAB — BASIC METABOLIC PANEL WITH GFR
Anion gap: 5 (ref 5–15)
BUN: 5 mg/dL — ABNORMAL LOW (ref 6–20)
CO2: 24 mmol/L (ref 22–32)
Calcium: 8 mg/dL — ABNORMAL LOW (ref 8.9–10.3)
Chloride: 110 mmol/L (ref 98–111)
Creatinine, Ser: 0.66 mg/dL (ref 0.44–1.00)
GFR, Estimated: >60 mL/min (ref >60)
Glucose, Bld: 92 mg/dL (ref 70–99)
Potassium: 3.6 mmol/L (ref 3.5–5.1)
Sodium: 139 mmol/L (ref 135–145)

## 2024-03-19 LAB — CSF CULTURE W GRAM STAIN
Culture: NO GROWTH
Gram Stain: NONE SEEN
Special Requests: NORMAL

## 2024-03-19 LAB — CULTURE, BLOOD (ROUTINE X 2)
Culture: NO GROWTH
Culture: NO GROWTH
Special Requests: ADEQUATE

## 2024-03-19 LAB — MAGNESIUM: Magnesium: 2 mg/dL (ref 1.7–2.4)

## 2024-03-19 MED ORDER — DICLOFENAC SODIUM 1 % EX GEL
2.0000 g | Freq: Four times a day (QID) | CUTANEOUS | Status: DC | PRN
  Administered 2024-03-19 – 2024-03-22 (×3): 2 g via TOPICAL
  Filled 2024-03-19 (×2): qty 100

## 2024-03-19 MED ORDER — CLONAZEPAM 0.5 MG PO TABS
0.5000 mg | ORAL_TABLET | Freq: Three times a day (TID) | ORAL | Status: DC | PRN
Start: 2024-03-19 — End: 2024-03-24
  Administered 2024-03-19 – 2024-03-24 (×14): 0.5 mg via ORAL
  Filled 2024-03-19 (×14): qty 1

## 2024-03-19 NOTE — Progress Notes (Signed)
 Has been alert and oriented x 4 and ambulating to bathroom.  Slept well last night after receiving seroquel  and ambien .

## 2024-03-19 NOTE — Plan of Care (Signed)
   Problem: Education: Goal: Knowledge of General Education information will improve Description: Including pain rating scale, medication(s)/side effects and non-pharmacologic comfort measures Outcome: Progressing   Problem: Clinical Measurements: Goal: Ability to maintain clinical measurements within normal limits will improve Outcome: Progressing Goal: Diagnostic test results will improve Outcome: Progressing

## 2024-03-19 NOTE — H&P (Addendum)
 Progress note:   A/p: 53 y.o. 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 Back to baseline on HD# 2-3  Initial wbc 28 No documented fever here Wbc improving/normalized on empiric abx  9/5 lp'ed Glucose 62, protein 28 Wbc 8 Colorless Csf pcr negative Csf cx negative  9/5 blood culture negative   9/4 mri brain/mra brain 1. 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 2. Severe stenosis versus short segment occlusion of a proximal M2 superdivision branch of the left MCA. 3. These foci of stenosis are relatively symmetric and occur on nearly the same axial slices suggesting that the findings may be related to artifact. There are no additional high-grade stenoses of the intracranial arteries notes. Consider CTA for further evaluation.  1. 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    -suspect nonspecific viral process -given severe presentation and abx already on board, reasonable to finish 10 day empiric abx -will adjust empiric abx to vanc/ceftriaxone  -- finish on 9/13 -maintain standard isolation precaution -id will sign off -discussed with primary team    I spent more than 15 minute reviewing data/chart, providing counseling/discussing diagnostics/treatment plan with treatment team   Subjective: Chart reviewed Patient at baseline Afebrile No leukocytosis   Objectives: Vitals:   03/19/24  0537 03/19/24 1359  BP: 127/84 112/78  Pulse: 66 69  Resp: 20 18  Temp: 98.7 F (37.1 C) 99 F (37.2 C)  SpO2: 96% 99%   Chart reviewed   Labs: Lab Results  Component Value Date   WBC 7.0 03/18/2024   HGB 10.9 (L) 03/18/2024   HCT 34.5 (L) 03/18/2024   MCV 99.7 03/18/2024   PLT 237 03/18/2024   Last metabolic panel Lab Results  Component Value Date   GLUCOSE 92 03/19/2024   NA 139 03/19/2024   K 3.6 03/19/2024   CL 110 03/19/2024   CO2 24 03/19/2024   BUN 5 (L) 03/19/2024   CREATININE 0.66 03/19/2024   GFRNONAA >60 03/19/2024   CALCIUM 8.0 (L) 03/19/2024   PHOS 3.2 03/18/2024   PROT 6.9 03/14/2024   ALBUMIN 2.8 (L) 03/18/2024   BILITOT 0.7 03/14/2024   ALKPHOS 78 03/14/2024   AST 39 03/14/2024   ALT 19 03/14/2024   ANIONGAP 5 03/19/2024     Imaging: Reviewed          Constance ONEIDA Passer, MD [facility] [phone]  pager   [phone] cell 03/19/2024, 2:26 PM

## 2024-03-19 NOTE — Progress Notes (Signed)
 PROGRESS NOTE  Sherry [name], is a 53 y.o. female, DOB - 01-15-1971, FMW:[id number]  Admit date - 03/14/2024   Admitting Physician [name], MD  Outpatient Primary MD for the patient is [name], MD  LOS - 5  Chief Complaint  Patient presents with   Altered Mental Status      Brief Narrative:   53 y.o. female with medical history significant for hypertension, hyperlipidemia, depression, anxiety, and COPD who presents with confusion, agitation, cough, and shortness of breath admitted on 03/13/2024 with acute encephalopathy in the setting of recent episodes of emesis and diarrhea,  meets SIRS criteria and elevated lactate  --- Concerns for encephalitis/meningitis--please see MRI brain findings - Had LP on 03/16/2024---studies mostly negative -Rescinded IVC on 03/17/24 -- -Disposition--as of 03/18/2024 patient is medically ready for discharge home with OPAT for IV Rocephin /IV vancomycin  in meningitic doses as recommended by ID physicians--- however patient has no insurance and no ability to pay for home infusion of above antibiotics -- She will stay in the hospital to complete IV antibiotics last dose of IV antibiotics will be 03/24/2024   -Assessment and Plan: 1) Acute Encephalopathy --??  Metabolic in the setting of dehydration and electrolyte derangement or Infectious Given fevers and GI symptoms---Concerns for encephalitis/meningitis - -CT head without acute findings -MRI brain without contrast with possible  encephalitides of various etiologies (including infectious and autoimmune). Atypical distribution of PRES is also a possibility-- Serum ammonia WNL  -UDS with benzos and THC - TSH and B12 WNL -PTA patient was on benzos UDS confirm please avoid abrupt discontinuation of benzos --Infectious disease and neurology consult requested Transferred to [facility]  For lumbar puncture and further neurology and ID consults -MRI head without contrast---without septic emboli -MRI  brain with contrast to evaluate for meningitis/encephalitis pending --No rash, no recent travel ,no insect bites -Lumbar puncture with CSF studies requested --Received cefepime  Vanco and Flagyl  initially -Changed to Rocephin  (meningitic doses) and vancomycin  -Acyclovir  added on 03/15/2024, discontinued on 03/17/2023 -ID physician recommends  Rocephin /vancomycin --plan will be to treat with meningitis appropriate antibiotics for total of 10 days--last dose 03/24/24 -- Last dose of ampicillin  03/19/2024 Sed rate 3, CRP 0.5 WBC 27.6 >>28.4 >>20.2>>14.3>>7.0 -Mentation is back to baseline as of 03/16/2024 -Tolerated LP well on 03/16/2024---LP on day 4 of abx ->21 opening pressure, 8 wbc, , 2 rbc, 62 glc, 28 ptn, ME panel negative  -CSF Gram stain negative, CSF culture NGTD, blood cultures NGTD ---- EEG without epileptiform findings --Rescinded IVC on 03/17/24   2)SIRS dehydration with lactic acidosis--POA - Leukocytosis, tachypnea, and tachycardia present on admission  - Abdomen is soft and no-tender, there is no conspicuous pneumonia on CXR, no bacteriuria or pyuria, no meningismus, no apparent cellulitis  -Blood and CSF cultures NGTD SIRs pathophysiology has resolved - Antibiotics as above #1 --Please see ID physician consult note/H&P dated 03/19/2024   3. AKI -- -acute kidney injury  -   creatinine on admission= 1.07,  baseline creatinine = 0.5-0.6   ,  creatinine is now= 0.6,  -- AKI resolved with IV fluids Renally adjust medications, avoid nephrotoxic agents / dehydration  / hypotension  4.  Recurrent emesis and diarrhea--- abdominal x-ray without obstructive findings or any other acute findings -Send stool for C. difficile and GI pathogen if able -Much improved, tolerating oral intake well  5. Hypokalemia/hyponatremia/hypophosphatemia--due to GI losses in the setting of emesis and diarrhea prior to admission as well as poor oral intake - Sodium normalized with hydration -  Continue IV fluids  until oral intake is more reliable - Continue to replace electrolyte -Magnesium  WNL  6. Depression/Anxiety  -c/n needed clonazepam    7. Hypertension  - -MRI brain suggest possible atypical distribution of PRES --BP still not at goal, but overall BP trend improving - c/n topical clonidine  patch  --Anticipate that BP will improve further now that patient is alert and awake and able to take oral medications -- May alternate IV labetalol  and IV hydralazine  as needed  8)COPD - Not in exacerbation  - Continue short-acting bronchodilators as-needed   9) chronic anemia--Hgb down to 10.9, Hgb was high on admission due to hemoconcentration - Hgb is  down now due to hemodilution from IV fluids - No obvious bleeding - Prior history of anemia noted  Disposition--as of 03/18/2024 patient is medically ready for discharge home with OPAT for IV Rocephin /IV vancomycin  in meningitic doses as recommended by ID physicians--- however patient has no insurance and no ability to pay for home infusion of above antibiotics -- She will stay in the hospital to complete IV antibiotics last dose of IV antibiotics will be 03/24/2024  Status is: Inpatient   Disposition: The patient is from: Home              Anticipated d/c is to: TBD              Anticipated d/c date is: > 3 days              Patient currently is not medically stable to d/c. Barriers: Not Clinically Stable-   Code Status :  -  Code Status: Full Code   Family Communication:    Discussed with boyfriend of 8 years Tim at bedside  DVT Prophylaxis  :   - SCDs   enoxaparin  (LOVENOX ) injection 40 mg Start: 03/14/24 2200   Lab Results  Component Value Date   PLT 237 03/18/2024   Inpatient Medications  Scheduled Meds:  cloNIDine   0.1 mg Transdermal Weekly   enoxaparin  (LOVENOX ) injection  40 mg Subcutaneous Q24H   labetalol   20 mg Intravenous Once   metoprolol  tartrate  25 mg Oral BID   nicotine   21 mg Transdermal Daily   pantoprazole   40  mg Oral QAC breakfast   QUEtiapine   400 mg Oral QHS   sodium chloride  flush  3 mL Intravenous Q12H   Continuous Infusions:  sodium chloride  10 mL/hr at 03/18/24 1618   cefTRIAXone  (ROCEPHIN )  IV 2 g (03/19/24 0854)   vancomycin  1,000 mg (03/19/24 1008)   PRN Meds:.acetaminophen  **OR** acetaminophen , clonazePAM , hydrALAZINE , ipratropium-albuterol , ondansetron  (ZOFRAN ) IV, mouth rinse, trimethobenzamide , zolpidem    Anti-infectives (From admission, onward)    Start     Dose/Rate Route Frequency Ordered Stop   03/18/24 2000  vancomycin  (VANCOCIN ) IVPB 1000 mg/200 mL premix  Status:  Discontinued        1,000 mg 200 mL/hr over 60 Minutes Intravenous Every 12 hours 03/18/24 0952 03/18/24 1003   03/18/24 1030  vancomycin  (VANCOCIN ) IVPB 1000 mg/200 mL premix        1,000 mg 200 mL/hr over 60 Minutes Intravenous Every 12 hours 03/18/24 1003     03/15/24 1600  ampicillin  (OMNIPEN) 2 g in sodium chloride  0.9 % 100 mL IVPB  Status:  Discontinued        2 g 300 mL/hr over 20 Minutes Intravenous Every 4 hours 03/15/24 1513 03/19/24 1005   03/15/24 1100  acyclovir  (ZOVIRAX ) 500 mg in dextrose  5 % 100 mL IVPB  Status:  Discontinued        500 mg 110 mL/hr over 60 Minutes Intravenous Every 8 hours 03/15/24 0911 03/16/24 1844   03/15/24 1000  cefTRIAXone  (ROCEPHIN ) 2 g in sodium chloride  0.9 % 100 mL IVPB        2 g 200 mL/hr over 30 Minutes Intravenous Every 12 hours 03/15/24 0858     03/15/24 0800  vancomycin  (VANCOCIN ) IVPB 1000 mg/200 mL premix  Status:  Discontinued        1,000 mg 200 mL/hr over 60 Minutes Intravenous Every 24 hours 03/14/24 1107 03/18/24 0952   03/14/24 1600  ceFEPIme  (MAXIPIME ) 2 g in sodium chloride  0.9 % 100 mL IVPB  Status:  Discontinued        2 g 200 mL/hr over 30 Minutes Intravenous Every 12 hours 03/14/24 0647 03/15/24 0858   03/14/24 0800  metroNIDAZOLE  (FLAGYL ) IVPB 500 mg        500 mg 100 mL/hr over 60 Minutes Intravenous Every 12 hours 03/14/24 0639 03/16/24  0906   03/14/24 0647  vancomycin  variable dose per unstable renal function (pharmacist dosing)  Status:  Discontinued         Does not apply See admin instructions 03/14/24 0647 03/14/24 1107   03/14/24 0415  vancomycin  (VANCOCIN ) IVPB 1000 mg/200 mL premix        1,000 mg 200 mL/hr over 60 Minutes Intravenous  Once 03/14/24 0405 03/14/24 0607   03/14/24 0415  ceFEPIme  (MAXIPIME ) 2 g in sodium chloride  0.9 % 100 mL IVPB        2 g 200 mL/hr over 30 Minutes Intravenous  Once 03/14/24 0408 03/14/24 0501        Subjective: Syrita Schuitema today has no further emesis,  No chest pain,    - No new concerns - Significant other at bedside, questions answered -- Awaiting completion of IV antibiotics - Ambulating around   Objective: Vitals:   03/18/24 1656 03/18/24 2043 03/19/24 0537 03/19/24 1359  BP:  124/80 127/84 112/78  Pulse:  72 66 69  Resp:   20 18  Temp:  99.5 F (37.5 C) 98.7 F (37.1 C) 99 F (37.2 C)  TempSrc:  Oral Oral Oral  SpO2: 94% 96% 96% 99%  Weight:      Height:        Intake/Output Summary (Last 24 hours) at 03/19/2024 1750 Last data filed at 03/19/2024 1413 Gross per 24 hour  Intake 4996.42 ml  Output --  Net 4996.42 ml   Filed Weights   03/14/24 0227 03/15/24 2223 03/18/24 0500  Weight: 51 kg 61.2 kg 60.6 kg    Physical Exam Gen:-Alert and oriented, cooperative, no acute distress HEENT:- .AT, No sclera icterus, mostly edentulous Neck-Supple Neck,No JVD,.  Lungs-  CTAB , fair symmetrical air movement CV- S1, S2 normal, regular  Abd-  +ve B.Sounds, Abd Soft, No tenderness,    Extremity/Skin:- No  edema, pedal pulses present , no rash, no obvious insect bites--full skin exam with chaperone and patient's significant other at bedside Psych-affect is appropriate,, alert and oriented x 3, mentation is back to baseline,- Patient is verbal and communicative Neuro--no new focal deficits, no tremors - Neuroexam remains very reassuring  Data Reviewed: I have  personally reviewed following labs and imaging studies  CBC: Recent Labs  Lab 03/14/24 0251 03/15/24 0408 03/15/24 2206 03/16/24 0931 03/18/24 0344  WBC 28.4* 20.2* 20.1* 14.3* 7.0  NEUTROABS 25.6*  --   --  10.0*  --  HGB 15.7* 15.3* 16.6* 13.8 10.9*  HCT 45.8 44.6 49.7* 40.6 34.5*  MCV 94.2 94.1 96.3 94.6 99.7  PLT 469* 382 382 247 237   Basic Metabolic Panel: Recent Labs  Lab 03/14/24 0251 03/15/24 0408 03/15/24 2206 03/16/24 0931 03/17/24 0354 03/18/24 0344 03/19/24 0434  NA 141   < > 134* 136 142 141 139  K 3.1*   < > 3.3* 2.9* 3.9 3.1* 3.6  CL 99   < > 105 106 113* 109 110  CO2 24   < > 16* 18* 21* 24 24  GLUCOSE 154*   < > 112* 142* 111* 106* 92  BUN 17   < > 12 9 8 7  5*  CREATININE 1.07*   < > 0.52 0.53 0.55 0.63 0.66  CALCIUM 9.6   < > 8.0* 8.0* 8.1* 8.1* 8.0*  MG 2.0  --  2.1  --   --   --  2.0  PHOS 3.4  --   --  1.7*  --  3.2  --    < > = values in this interval not displayed.   GFR: Estimated Creatinine Clearance: 72.3 mL/min (by C-G formula based on SCr of 0.66 mg/dL). Liver Function Tests: Recent Labs  Lab 03/14/24 0250 03/16/24 0931 03/18/24 0344  AST 39  --   --   ALT 19  --   --   ALKPHOS 78  --   --   BILITOT 0.7  --   --   PROT 6.9  --   --   ALBUMIN 3.8 3.0* 2.8*   Recent Results (from the past 240 hours)  Blood culture (routine x 2)     Status: None   Collection Time: 03/14/24  2:50 AM   Specimen: BLOOD  Result Value Ref Range Status   Specimen Description BLOOD RIGHT ANTECUBITAL  Final   Special Requests   Final    BOTTLES DRAWN AEROBIC AND ANAEROBIC Blood Culture results may not be optimal due to an inadequate volume of blood received in culture bottles   Culture   Final    NO GROWTH 5 DAYS Performed at [facility], [address] [place], [place] [address]    Report Status 03/19/2024 FINAL  Final  Resp panel by RT-PCR (RSV, Flu A&B, Covid) Anterior Nasal Swab     Status: None   Collection Time: 03/14/24  4:07 AM    Specimen: Anterior Nasal Swab  Result Value Ref Range Status   SARS Coronavirus 2 by RT PCR NEGATIVE NEGATIVE Final    Comment: (NOTE) SARS-CoV-2 target nucleic acids are NOT DETECTED.  The SARS-CoV-2 RNA is generally detectable in upper respiratory specimens during the acute phase of infection. The lowest concentration of SARS-CoV-2 viral copies this assay can detect is 138 copies/mL. A negative result does not preclude SARS-Cov-2 infection and should not be used as the sole basis for treatment or other patient management decisions. A negative result may occur with  improper specimen collection/handling, submission of specimen other than nasopharyngeal swab, presence of viral mutation(s) within the areas targeted by this assay, and inadequate number of viral copies(<138 copies/mL). A negative result must be combined with clinical observations, patient history, and epidemiological information. The expected result is Negative.  Fact Sheet for Patients:  BloggerCourse.com  Fact Sheet for Healthcare Providers:  SeriousBroker.it  This test is no t yet approved or cleared by the [place]  FDA and  has been authorized for detection and/or diagnosis of SARS-CoV-2 by FDA under  an Emergency Use Authorization (EUA). This EUA will remain  in effect (meaning this test can be used) for the duration of the COVID-19 declaration under Section 564(b)(1) of the Act, 21 U.S.C.section 360bbb-3(b)(1), unless the authorization is terminated  or revoked sooner.       Influenza A by PCR NEGATIVE NEGATIVE Final   Influenza B by PCR NEGATIVE NEGATIVE Final    Comment: (NOTE) The Xpert Xpress SARS-CoV-2/FLU/RSV plus assay is intended as an aid in the diagnosis of influenza from Nasopharyngeal swab specimens and should not be used as a sole basis for treatment. Nasal washings and aspirates are unacceptable for Xpert Xpress  SARS-CoV-2/FLU/RSV testing.  Fact Sheet for Patients: BloggerCourse.com  Fact Sheet for Healthcare Providers: SeriousBroker.it  This test is not yet approved or cleared by the [place]  FDA and has been authorized for detection and/or diagnosis of SARS-CoV-2 by FDA under an Emergency Use Authorization (EUA). This EUA will remain in effect (meaning this test can be used) for the duration of the COVID-19 declaration under Section 564(b)(1) of the Act, 21 U.S.C. section 360bbb-3(b)(1), unless the authorization is terminated or revoked.     Resp Syncytial Virus by PCR NEGATIVE NEGATIVE Final    Comment: (NOTE) Fact Sheet for Patients: BloggerCourse.com  Fact Sheet for Healthcare Providers: SeriousBroker.it  This test is not yet approved or cleared by the [place]  FDA and has been authorized for detection and/or diagnosis of SARS-CoV-2 by FDA under an Emergency Use Authorization (EUA). This EUA will remain in effect (meaning this test can be used) for the duration of the COVID-19 declaration under Section 564(b)(1) of the Act, 21 U.S.C. section 360bbb-3(b)(1), unless the authorization is terminated or revoked.  Performed at [facility], [address], [place], [place] [address]   Blood culture (routine x 2)     Status: None   Collection Time: 03/14/24  5:20 AM   Specimen: BLOOD  Result Value Ref Range Status   Specimen Description BLOOD BLOOD LEFT WRIST  Final   Special Requests   Final    BOTTLES DRAWN AEROBIC AND ANAEROBIC Blood Culture adequate volume   Culture   Final    NO GROWTH 5 DAYS Performed at [facility], [address] [place], [place] [address]    Report Status 03/19/2024 FINAL  Final  MRSA Next Gen by PCR, Nasal     Status: None   Collection Time: 03/15/24 10:27 PM   Specimen: Nasal Mucosa; Nasal Swab  Result Value Ref Range Status   MRSA by PCR  Next Gen NOT DETECTED NOT DETECTED Final    Comment: (NOTE) The GeneXpert MRSA Assay (FDA approved for NASAL specimens only), is one component of a comprehensive MRSA colonization surveillance program. It is not intended to diagnose MRSA infection nor to guide or monitor treatment for MRSA infections. Test performance is not FDA approved in patients less than 16 years old. Performed at [facility], [address], [place], [place] [address]   CSF culture w Gram Stain     Status: None   Collection Time: 03/16/24 12:03 PM   Specimen: Lumbar Puncture; Cerebrospinal Fluid  Result Value Ref Range Status   Specimen Description   Final    LUMBAR Performed at [facility], [address], [place], [place] [address]    Special Requests   Final    Normal Performed at [facility], [address], [place], [place] [address]    Gram Stain   Final    NO ORGANISMS SEEN  NO WBC SEEN CYTOSPIN SMEAR CSF Gram Stain Report Called to,Read Back By and Verified With: COOK @ 1421 ON [id number] BY HENDERSON L Performed at [facility], [address] [place], [place] [address]    Culture   Final    NO GROWTH 3 DAYS Performed at [facility], [address], [place], [place] [address]    Report Status 03/19/2024 FINAL  Final  Fungus Culture Without Stain     Status: None (Preliminary result)   Collection Time: 03/16/24 12:03 PM   Specimen: CSF; Other  Result Value Ref Range Status   Specimen Description   Final    CSF Performed at [facility], [address] [place], [place] [address]    Special Requests   Final    NONE Performed at [facility], [address] [place], [place] [address]    Culture   Final    NO FUNGUS ISOLATED AFTER 3 DAYS Performed at [facility], [address] [place], [place] [address]    Report Status PENDING  Incomplete    Radiology Studies: US  EKG SITE RITE Result Date: 03/18/2024 If Site Rite image not attached, placement could not be confirmed due to  current cardiac rhythm.  Scheduled Meds:  cloNIDine   0.1 mg Transdermal Weekly   enoxaparin  (LOVENOX ) injection  40 mg Subcutaneous Q24H   labetalol   20 mg Intravenous Once   metoprolol  tartrate  25 mg Oral BID   nicotine   21 mg Transdermal Daily   pantoprazole   40 mg Oral QAC breakfast   QUEtiapine   400 mg Oral QHS   sodium chloride  flush  3 mL Intravenous Q12H   Continuous Infusions:  sodium chloride  10 mL/hr at 03/18/24 1618   cefTRIAXone  (ROCEPHIN )  IV 2 g (03/19/24 0854)   vancomycin  1,000 mg (03/19/24 1008)    LOS: 5 days   Rendall Carwin M.D on 03/19/2024 at 5:50 PM  Go to [email or web address] - for contact info  [facility] - Office  [phone]  If 7PM-7AM, please contact night-coverage [email or web address] 03/19/2024, 5:50 PM

## 2024-03-19 NOTE — TOC CM/SW Note (Signed)
 Late entry: Earlier today I spoke c/admitting to check the status of pt's Medicaid application and was told it is still pending. I then called [facility] DSS directly and was told by two representatives that pt has not applied for Medicaid. She did apply for another DSS service in July 2025 but it was not Medicaid. Informed pt that there is no Medicaid application on record at DSS and that she will need to start the application process ASAP.   [facility] liaison is preparing an out-of-pocket/private pay estimated cost for home infusion services, RN, IV medications and labs as a potential option for pt and her significant other.

## 2024-03-20 DIAGNOSIS — G9340 Encephalopathy, unspecified: Secondary | ICD-10-CM | POA: Diagnosis not present

## 2024-03-20 LAB — VDRL, CSF: VDRL Quant, CSF: NONREACTIVE

## 2024-03-20 MED ORDER — ZOLPIDEM TARTRATE 5 MG PO TABS
5.0000 mg | ORAL_TABLET | Freq: Every day | ORAL | Status: AC
  Administered 2024-03-20 – 2024-03-22 (×3): 5 mg via ORAL
  Filled 2024-03-20 (×3): qty 1

## 2024-03-20 NOTE — Plan of Care (Signed)
  Problem: Education: Goal: Knowledge of General Education information will improve Description: Including pain rating scale, medication(s)/side effects and non-pharmacologic comfort measures Outcome: Adequate for Discharge   Problem: Clinical Measurements: Goal: Ability to maintain clinical measurements within normal limits will improve Outcome: Adequate for Discharge Goal: Diagnostic test results will improve Outcome: Adequate for Discharge

## 2024-03-20 NOTE — Progress Notes (Signed)
 PROGRESS NOTE  Sherry [name], is a 53 y.o. female, DOB - 1970-10-26, FMW:[id number]  Admit date - 03/14/2024   Admitting Physician Marvelous Woolford Pearlean, MD  Outpatient Primary MD for the patient is [name], MD  LOS - 6  Chief Complaint  Patient presents with   Altered Mental Status      Brief Narrative:   53 y.o. female with medical history significant for hypertension, hyperlipidemia, depression, anxiety, and COPD who presents with confusion, agitation, cough, and shortness of breath admitted on 03/13/2024 with acute encephalopathy in the setting of recent episodes of emesis and diarrhea,  meets SIRS criteria and elevated lactate  --- Concerns for encephalitis/meningitis--please see MRI brain findings - Had LP on 03/16/2024---studies mostly negative -Rescinded IVC on 03/17/24 -- -Disposition--as of 03/18/2024 patient is medically ready for discharge home with OPAT for IV Rocephin /IV vancomycin  in meningitic doses as recommended by ID physicians--- however patient has no insurance and no ability to pay for home infusion of above antibiotics -- She will stay in the hospital to complete IV antibiotics last dose of IV antibiotics will be 03/24/2024   -Assessment and Plan: 1) Acute Encephalopathy --??  Metabolic in the setting of dehydration and electrolyte derangement or Infectious Given fevers and GI symptoms---Concerns for encephalitis/meningitis - -CT head without acute findings -MRI brain without contrast with possible  encephalitides of various etiologies (including infectious and autoimmune). Atypical distribution of PRES is also a possibility-- Serum ammonia WNL  -UDS with benzos and THC - TSH and B12 WNL -PTA patient was on benzos UDS confirm please avoid abrupt discontinuation of benzos --Infectious disease and neurology consult requested Transferred to [facility]  For lumbar puncture and further neurology and ID consults -MRI head without contrast---without septic emboli -MRI  brain with contrast to evaluate for meningitis/encephalitis pending --No rash, no recent travel ,no insect bites -Lumbar puncture with CSF studies requested --Received cefepime  Vanco and Flagyl  initially -Changed to Rocephin  (meningitic doses) and vancomycin  -Acyclovir  added on 03/15/2024, discontinued on 03/17/2023 -ID physician recommends  Rocephin /vancomycin --plan will be to treat with meningitis appropriate antibiotics for total of 10 days--last dose 03/24/24 -- Last dose of ampicillin  03/19/2024 Sed rate 3, CRP 0.5 WBC 27.6 >>28.4 >>20.2>>14.3>>7.0 -Mentation is back to baseline as of 03/16/2024 -Tolerated LP well on 03/16/2024---LP on day 4 of abx ->21 opening pressure, 8 wbc, , 2 rbc, 62 glc, 28 ptn, ME panel negative  -CSF Gram stain negative, CSF culture NGTD, blood cultures NGTD ---- EEG without epileptiform findings --Rescinded IVC on 03/17/24   2)SIRS dehydration with lactic acidosis--POA - Leukocytosis, tachypnea, and tachycardia present on admission  - Abdomen is soft and no-tender, there is no conspicuous pneumonia on CXR, no bacteriuria or pyuria, no meningismus, no apparent cellulitis  -Blood and CSF cultures NGTD SIRs pathophysiology has resolved - Antibiotics as above #1 --Please see ID physician consult note/H&P dated 03/19/2024   3. AKI -- -acute kidney injury  -   creatinine on admission= 1.07,  baseline creatinine = 0.5-0.6   ,  creatinine is now= 0.6,  -- AKI resolved with IV fluids Renally adjust medications, avoid nephrotoxic agents / dehydration  / hypotension  4.  Recurrent emesis and diarrhea--- abdominal x-ray without obstructive findings or any other acute findings -Send stool for C. difficile and GI pathogen if able -Much improved, tolerating oral intake well  5. Hypokalemia/hyponatremia/hypophosphatemia--due to GI losses in the setting of emesis and diarrhea prior to admission as well as poor oral intake - Sodium normalized with hydration -  Continue IV fluids  until oral intake is more reliable - Continue to replace electrolyte -Magnesium  WNL  6. Depression/Anxiety  -c/n needed clonazepam    7. Hypertension  - -MRI brain suggest possible atypical distribution of PRES --BP still not at goal, but overall BP trend improving - c/n topical clonidine  patch  --Anticipate that BP will improve further now that patient is alert and awake and able to take oral medications -- May alternate IV labetalol  and IV hydralazine  as needed  8)COPD - Not in exacerbation  - Continue short-acting bronchodilators as-needed   9) chronic anemia--Hgb down to 10.9, Hgb was high on admission due to hemoconcentration - Hgb is  down now due to hemodilution from IV fluids - No obvious bleeding - Prior history of anemia noted  Disposition--as of 03/18/2024 patient is medically ready for discharge home with OPAT for IV Rocephin /IV vancomycin  in meningitic doses as recommended by ID physicians--- however patient has no insurance and no ability to pay for home infusion of above antibiotics -- She will stay in the hospital to complete IV antibiotics last dose of IV antibiotics will be 03/24/2024  Status is: Inpatient   Disposition: The patient is from: Home              Anticipated d/c is to: TBD              Anticipated d/c date is: > 3 days              Patient currently is not medically stable to d/c. Barriers: Not Clinically Stable-   Code Status :  -  Code Status: Full Code   Family Communication:    Discussed with boyfriend of 8 years Tim at bedside  DVT Prophylaxis  :   - SCDs   enoxaparin  (LOVENOX ) injection 40 mg Start: 03/14/24 2200   Lab Results  Component Value Date   PLT 237 03/18/2024   Inpatient Medications  Scheduled Meds:  cloNIDine   0.1 mg Transdermal Weekly   enoxaparin  (LOVENOX ) injection  40 mg Subcutaneous Q24H   labetalol   20 mg Intravenous Once   metoprolol  tartrate  25 mg Oral BID   nicotine   21 mg Transdermal Daily   pantoprazole   40  mg Oral QAC breakfast   QUEtiapine   400 mg Oral QHS   sodium chloride  flush  3 mL Intravenous Q12H   Continuous Infusions:  sodium chloride  10 mL/hr at 03/18/24 1618   cefTRIAXone  (ROCEPHIN )  IV 2 g (03/20/24 0928)   vancomycin  1,000 mg (03/20/24 1024)   PRN Meds:.acetaminophen  **OR** acetaminophen , clonazePAM , diclofenac  Sodium, hydrALAZINE , ipratropium-albuterol , ondansetron  (ZOFRAN ) IV, mouth rinse, trimethobenzamide , zolpidem    Anti-infectives (From admission, onward)    Start     Dose/Rate Route Frequency Ordered Stop   03/18/24 2000  vancomycin  (VANCOCIN ) IVPB 1000 mg/200 mL premix  Status:  Discontinued        1,000 mg 200 mL/hr over 60 Minutes Intravenous Every 12 hours 03/18/24 0952 03/18/24 1003   03/18/24 1030  vancomycin  (VANCOCIN ) IVPB 1000 mg/200 mL premix        1,000 mg 200 mL/hr over 60 Minutes Intravenous Every 12 hours 03/18/24 1003     03/15/24 1600  ampicillin  (OMNIPEN) 2 g in sodium chloride  0.9 % 100 mL IVPB  Status:  Discontinued        2 g 300 mL/hr over 20 Minutes Intravenous Every 4 hours 03/15/24 1513 03/19/24 1005   03/15/24 1100  acyclovir  (ZOVIRAX ) 500 mg in dextrose  5 % 100  mL IVPB  Status:  Discontinued        500 mg 110 mL/hr over 60 Minutes Intravenous Every 8 hours 03/15/24 0911 03/16/24 1844   03/15/24 1000  cefTRIAXone  (ROCEPHIN ) 2 g in sodium chloride  0.9 % 100 mL IVPB        2 g 200 mL/hr over 30 Minutes Intravenous Every 12 hours 03/15/24 0858     03/15/24 0800  vancomycin  (VANCOCIN ) IVPB 1000 mg/200 mL premix  Status:  Discontinued        1,000 mg 200 mL/hr over 60 Minutes Intravenous Every 24 hours 03/14/24 1107 03/18/24 0952   03/14/24 1600  ceFEPIme  (MAXIPIME ) 2 g in sodium chloride  0.9 % 100 mL IVPB  Status:  Discontinued        2 g 200 mL/hr over 30 Minutes Intravenous Every 12 hours 03/14/24 0647 03/15/24 0858   03/14/24 0800  metroNIDAZOLE  (FLAGYL ) IVPB 500 mg        500 mg 100 mL/hr over 60 Minutes Intravenous Every 12 hours  03/14/24 0639 03/16/24 0906   03/14/24 0647  vancomycin  variable dose per unstable renal function (pharmacist dosing)  Status:  Discontinued         Does not apply See admin instructions 03/14/24 0647 03/14/24 1107   03/14/24 0415  vancomycin  (VANCOCIN ) IVPB 1000 mg/200 mL premix        1,000 mg 200 mL/hr over 60 Minutes Intravenous  Once 03/14/24 0405 03/14/24 0607   03/14/24 0415  ceFEPIme  (MAXIPIME ) 2 g in sodium chloride  0.9 % 100 mL IVPB        2 g 200 mL/hr over 30 Minutes Intravenous  Once 03/14/24 0408 03/14/24 0501        Subjective: Nalini Coiro today has no further emesis,  No chest pain,    - No new concerns - Significant other at bedside, questions answered -- Awaiting completion of IV antibiotics - Ambulating around the unit   Objective: Vitals:   03/19/24 1359 03/19/24 2004 03/20/24 0339 03/20/24 1347  BP: 112/78 (!) 128/90 111/88 112/74  Pulse: 69 75 76 76  Resp: 18 18 18 17   Temp: 99 F (37.2 C) 99.4 F (37.4 C) 98.9 F (37.2 C) 98.5 F (36.9 C)  TempSrc: Oral Oral Oral Oral  SpO2: 99% 98% 98% 100%  Weight:      Height:        Intake/Output Summary (Last 24 hours) at 03/20/2024 1809 Last data filed at 03/20/2024 0500 Gross per 24 hour  Intake 1184.56 ml  Output --  Net 1184.56 ml   Filed Weights   03/14/24 0227 03/15/24 2223 03/18/24 0500  Weight: 51 kg 61.2 kg 60.6 kg    Physical Exam Gen:-Alert and oriented, cooperative, no acute distress HEENT:- [place].AT, No sclera icterus, mostly edentulous Neck-Supple Neck,No JVD,.  Lungs-  CTAB , fair symmetrical air movement CV- S1, S2 normal, regular  Abd-  +ve B.Sounds, Abd Soft, No tenderness,    Extremity/Skin:- No  edema, pedal pulses present , no rash, no obvious insect bites--full skin exam with chaperone and patient's significant other at bedside Psych-affect is appropriate,, alert and oriented x 3, mentation is back to baseline,- Patient is verbal and communicative Neuro--no new focal deficits, no  tremors - Neuroexam remains very reassuring  Data Reviewed: I have personally reviewed following labs and imaging studies  CBC: Recent Labs  Lab 03/14/24 0251 03/15/24 0408 03/15/24 2206 03/16/24 0931 03/18/24 0344  WBC 28.4* 20.2* 20.1* 14.3* 7.0  NEUTROABS 25.6*  --   --  10.0*  --   HGB 15.7* 15.3* 16.6* 13.8 10.9*  HCT 45.8 44.6 49.7* 40.6 34.5*  MCV 94.2 94.1 96.3 94.6 99.7  PLT 469* 382 382 247 237   Basic Metabolic Panel: Recent Labs  Lab 03/14/24 0251 03/15/24 0408 03/15/24 2206 03/16/24 0931 03/17/24 0354 03/18/24 0344 03/19/24 0434  NA 141   < > 134* 136 142 141 139  K 3.1*   < > 3.3* 2.9* 3.9 3.1* 3.6  CL 99   < > 105 106 113* 109 110  CO2 24   < > 16* 18* 21* 24 24  GLUCOSE 154*   < > 112* 142* 111* 106* 92  BUN 17   < > 12 9 8 7  5*  CREATININE 1.07*   < > 0.52 0.53 0.55 0.63 0.66  CALCIUM 9.6   < > 8.0* 8.0* 8.1* 8.1* 8.0*  MG 2.0  --  2.1  --   --   --  2.0  PHOS 3.4  --   --  1.7*  --  3.2  --    < > = values in this interval not displayed.   GFR: Estimated Creatinine Clearance: 72.3 mL/min (by C-G formula based on SCr of 0.66 mg/dL). Liver Function Tests: Recent Labs  Lab 03/14/24 0250 03/16/24 0931 03/18/24 0344  AST 39  --   --   ALT 19  --   --   ALKPHOS 78  --   --   BILITOT 0.7  --   --   PROT 6.9  --   --   ALBUMIN 3.8 3.0* 2.8*   Recent Results (from the past 240 hours)  Blood culture (routine x 2)     Status: None   Collection Time: 03/14/24  2:50 AM   Specimen: BLOOD  Result Value Ref Range Status   Specimen Description BLOOD RIGHT ANTECUBITAL  Final   Special Requests   Final    BOTTLES DRAWN AEROBIC AND ANAEROBIC Blood Culture results may not be optimal due to an inadequate volume of blood received in culture bottles   Culture   Final    NO GROWTH 5 DAYS Performed at [facility], [address] [place], [place] [address]    Report Status 03/19/2024 FINAL  Final  Resp panel by RT-PCR (RSV, Flu A&B, Covid) Anterior Nasal  Swab     Status: None   Collection Time: 03/14/24  4:07 AM   Specimen: Anterior Nasal Swab  Result Value Ref Range Status   SARS Coronavirus 2 by RT PCR NEGATIVE NEGATIVE Final    Comment: (NOTE) SARS-CoV-2 target nucleic acids are NOT DETECTED.  The SARS-CoV-2 RNA is generally detectable in upper respiratory specimens during the acute phase of infection. The lowest concentration of SARS-CoV-2 viral copies this assay can detect is 138 copies/mL. A negative result does not preclude SARS-Cov-2 infection and should not be used as the sole basis for treatment or other patient management decisions. A negative result may occur with  improper specimen collection/handling, submission of specimen other than nasopharyngeal swab, presence of viral mutation(s) within the areas targeted by this assay, and inadequate number of viral copies(<138 copies/mL). A negative result must be combined with clinical observations, patient history, and epidemiological information. The expected result is Negative.  Fact Sheet for Patients:  BloggerCourse.com  Fact Sheet for Healthcare Providers:  SeriousBroker.it  This test is no t yet approved or cleared by the [place]  FDA and  has been authorized for detection and/or diagnosis  of SARS-CoV-2 by FDA under an Emergency Use Authorization (EUA). This EUA will remain  in effect (meaning this test can be used) for the duration of the COVID-19 declaration under Section 564(b)(1) of the Act, 21 U.S.C.section 360bbb-3(b)(1), unless the authorization is terminated  or revoked sooner.       Influenza A by PCR NEGATIVE NEGATIVE Final   Influenza B by PCR NEGATIVE NEGATIVE Final    Comment: (NOTE) The Xpert Xpress SARS-CoV-2/FLU/RSV plus assay is intended as an aid in the diagnosis of influenza from Nasopharyngeal swab specimens and should not be used as a sole basis for treatment. Nasal washings and aspirates  are unacceptable for Xpert Xpress SARS-CoV-2/FLU/RSV testing.  Fact Sheet for Patients: BloggerCourse.com  Fact Sheet for Healthcare Providers: SeriousBroker.it  This test is not yet approved or cleared by the [place]  FDA and has been authorized for detection and/or diagnosis of SARS-CoV-2 by FDA under an Emergency Use Authorization (EUA). This EUA will remain in effect (meaning this test can be used) for the duration of the COVID-19 declaration under Section 564(b)(1) of the Act, 21 U.S.C. section 360bbb-3(b)(1), unless the authorization is terminated or revoked.     Resp Syncytial Virus by PCR NEGATIVE NEGATIVE Final    Comment: (NOTE) Fact Sheet for Patients: BloggerCourse.com  Fact Sheet for Healthcare Providers: SeriousBroker.it  This test is not yet approved or cleared by the [place]  FDA and has been authorized for detection and/or diagnosis of SARS-CoV-2 by FDA under an Emergency Use Authorization (EUA). This EUA will remain in effect (meaning this test can be used) for the duration of the COVID-19 declaration under Section 564(b)(1) of the Act, 21 U.S.C. section 360bbb-3(b)(1), unless the authorization is terminated or revoked.  Performed at [facility], [address] [place], [place] [address]   Blood culture (routine x 2)     Status: None   Collection Time: 03/14/24  5:20 AM   Specimen: BLOOD  Result Value Ref Range Status   Specimen Description BLOOD BLOOD LEFT WRIST  Final   Special Requests   Final    BOTTLES DRAWN AEROBIC AND ANAEROBIC Blood Culture adequate volume   Culture   Final    NO GROWTH 5 DAYS Performed at [facility], [address], [place], [place] [address]    Report Status 03/19/2024 FINAL  Final  MRSA Next Gen by PCR, Nasal     Status: None   Collection Time: 03/15/24 10:27 PM   Specimen: Nasal Mucosa; Nasal Swab  Result Value  Ref Range Status   MRSA by PCR Next Gen NOT DETECTED NOT DETECTED Final    Comment: (NOTE) The GeneXpert MRSA Assay (FDA approved for NASAL specimens only), is one component of a comprehensive MRSA colonization surveillance program. It is not intended to diagnose MRSA infection nor to guide or monitor treatment for MRSA infections. Test performance is not FDA approved in patients less than 5 years old. Performed at [facility], [address], [place], [place] [address]   CSF culture w Gram Stain     Status: None   Collection Time: 03/16/24 12:03 PM   Specimen: Lumbar Puncture; Cerebrospinal Fluid  Result Value Ref Range Status   Specimen Description   Final    LUMBAR Performed at [facility], [address] [place], [place] [address]    Special Requests   Final    Normal Performed at [facility], [address], [place], [place] [address]    Gram Stain   Final  NO ORGANISMS SEEN NO WBC SEEN CYTOSPIN SMEAR CSF Gram Stain Report Called to,Read Back By and Verified With: COOK @ 1421 ON [id number] BY HENDERSON L Performed at [facility], [address] [place], [place] [address]    Culture   Final    NO GROWTH 3 DAYS Performed at [facility], [address], [place], [place] [address]    Report Status 03/19/2024 FINAL  Final  Fungus Culture Without Stain     Status: None (Preliminary result)   Collection Time: 03/16/24 12:03 PM   Specimen: CSF; Other  Result Value Ref Range Status   Specimen Description   Final    CSF Performed at [facility], [address] [place], [place] [address]    Special Requests   Final    NONE Performed at [facility], [address], [place], [place] [address]    Culture   Final    NO FUNGUS IN 4 DAYS Performed at [facility], [address], [place], [place] [address]    Report Status PENDING  Incomplete    Radiology Studies: No results found.  Scheduled Meds:  cloNIDine   0.1 mg Transdermal Weekly   enoxaparin   (LOVENOX ) injection  40 mg Subcutaneous Q24H   labetalol   20 mg Intravenous Once   metoprolol  tartrate  25 mg Oral BID   nicotine   21 mg Transdermal Daily   pantoprazole   40 mg Oral QAC breakfast   QUEtiapine   400 mg Oral QHS   sodium chloride  flush  3 mL Intravenous Q12H   Continuous Infusions:  sodium chloride  10 mL/hr at 03/18/24 1618   cefTRIAXone  (ROCEPHIN )  IV 2 g (03/20/24 0928)   vancomycin  1,000 mg (03/20/24 1024)    LOS: 6 days   Rendall Carwin M.D on 03/20/2024 at 6:09 PM  Go to [email or web address] - for contact info  [facility] - Office  [phone]  If 7PM-7AM, please contact night-coverage [email or web address] 03/20/2024, 6:09 PM

## 2024-03-21 DIAGNOSIS — G9341 Metabolic encephalopathy: Secondary | ICD-10-CM

## 2024-03-21 DIAGNOSIS — E876 Hypokalemia: Secondary | ICD-10-CM | POA: Diagnosis not present

## 2024-03-21 DIAGNOSIS — I10 Essential (primary) hypertension: Secondary | ICD-10-CM | POA: Diagnosis not present

## 2024-03-21 DIAGNOSIS — R6510 Systemic inflammatory response syndrome (SIRS) of non-infectious origin without acute organ dysfunction: Secondary | ICD-10-CM | POA: Diagnosis not present

## 2024-03-21 DIAGNOSIS — J449 Chronic obstructive pulmonary disease, unspecified: Secondary | ICD-10-CM | POA: Diagnosis not present

## 2024-03-21 DIAGNOSIS — G9340 Encephalopathy, unspecified: Secondary | ICD-10-CM | POA: Diagnosis not present

## 2024-03-21 LAB — VANCOMYCIN, PEAK: Vancomycin Pk: 31 ug/mL (ref 30–40)

## 2024-03-21 LAB — VANCOMYCIN, TROUGH: Vancomycin Tr: 13 ug/mL — ABNORMAL LOW (ref 15–20)

## 2024-03-21 MED ORDER — METHOCARBAMOL 500 MG PO TABS
500.0000 mg | ORAL_TABLET | Freq: Three times a day (TID) | ORAL | Status: DC | PRN
  Administered 2024-03-21 – 2024-03-23 (×5): 500 mg via ORAL
  Filled 2024-03-21 (×5): qty 1

## 2024-03-21 MED ORDER — TRAMADOL HCL 50 MG PO TABS
50.0000 mg | ORAL_TABLET | Freq: Two times a day (BID) | ORAL | Status: DC | PRN
  Administered 2024-03-21 – 2024-03-24 (×6): 50 mg via ORAL
  Filled 2024-03-21 (×6): qty 1

## 2024-03-21 NOTE — Progress Notes (Signed)
 PROGRESS NOTE  Sherry [name], is a 53 y.o. female, DOB - 25-May-1971, FMW:[id number]  Admit date - 03/14/2024   Admitting Physician Courage Pearlean, MD  Outpatient Primary MD for the patient is [name], MD  LOS - 7  Chief Complaint  Patient presents with   Altered Mental Status      Brief Narrative:   53 y.o. female with medical history significant for hypertension, hyperlipidemia, depression, anxiety, and COPD who presents with confusion, agitation, cough, and shortness of breath admitted on 03/13/2024 with acute encephalopathy in the setting of recent episodes of emesis and diarrhea,  meets SIRS criteria and elevated lactate  --- Concerns for encephalitis/meningitis--please see MRI brain findings - Had LP on 03/16/2024---studies mostly negative -Rescinded IVC on 03/17/24 -- -Disposition--as of 03/18/2024 patient is medically ready for discharge home with OPAT for IV Rocephin /IV vancomycin  in meningitic doses as recommended by ID physicians--- however patient has no insurance and no ability to pay for home infusion of above antibiotics -- She will stay in the hospital to complete IV antibiotics last dose of IV antibiotics will be 03/24/2024   -Assessment and Plan: 1) Acute Encephalopathy --??  Metabolic in the setting of dehydration and electrolyte derangement or Infectious Given fevers and GI symptoms---Concerns for encephalitis/meningitis - -CT head without acute findings -MRI brain without contrast with possible  encephalitides of various etiologies (including infectious and autoimmune). Atypical distribution of PRES is also a possibility-- Serum ammonia WNL  -UDS with benzos and THC - TSH and B12 WNL -PTA patient was on benzos UDS confirm please avoid abrupt discontinuation of benzos --Infectious disease and neurology consult requested Transferred to [facility]  For lumbar puncture and further neurology and ID consults -MRI head without contrast---without septic emboli -MRI  brain with contrast to evaluate for meningitis/encephalitis pending --No rash, no recent travel ,no insect bites -Lumbar puncture with CSF studies requested --Received cefepime  Vanco and Flagyl  initially -Changed to Rocephin  (meningitic doses) and vancomycin  -Acyclovir  added on 03/15/2024, discontinued on 03/17/2023 -ID physician recommends  Rocephin /vancomycin --plan will be to treat with meningitis appropriate antibiotics for total of 10 days--last dose 03/24/24 -- Last dose of ampicillin  03/19/2024 Sed rate 3, CRP 0.5 WBC 27.6 >>28.4 >>20.2>>14.3>>7.0 -Mentation is back to baseline as of 03/16/2024 -Tolerated LP well on 03/16/2024---LP on day 4 of abx ->21 opening pressure, 8 wbc, , 2 rbc, 62 glc, 28 ptn, ME panel negative  -CSF Gram stain negative, CSF culture NGTD, blood cultures NGTD ---- EEG without epileptiform findings --Rescinded IVC on 03/17/24   2)SIRS dehydration with lactic acidosis--POA - Leukocytosis, tachypnea, and tachycardia present on admission  - Abdomen is soft and no-tender, there is no conspicuous pneumonia on CXR, no bacteriuria or pyuria, no meningismus, no apparent cellulitis  -Blood and CSF cultures NGTD SIRs pathophysiology has resolved - Antibiotics as above #1 --Please see ID physician consult note/H&P dated 03/19/2024   3. AKI -- -acute kidney injury  -   creatinine on admission= 1.07,  baseline creatinine = 0.5-0.6   ,  creatinine is now= 0.6,  -- AKI resolved with IV fluids Renally adjust medications, avoid nephrotoxic agents / dehydration  / hypotension  4.  Recurrent emesis and diarrhea--- abdominal x-ray without obstructive findings or any other acute findings -Send stool for C. difficile and GI pathogen if able -Much improved, tolerating oral intake well  5. Hypokalemia/hyponatremia/hypophosphatemia--due to GI losses in the setting of emesis and diarrhea prior to admission as well as poor oral intake - Sodium normalized with hydration -  Continue IV fluids  until oral intake is more reliable - Continue to replace electrolyte -Magnesium  WNL  6. Depression/Anxiety  -c/n needed clonazepam    7. Hypertension  - -MRI brain suggest possible atypical distribution of PRES --BP still not at goal, but overall BP trend improving - c/n topical clonidine  patch  --Anticipate that BP will improve further now that patient is alert and awake and able to take oral medications -- May alternate IV labetalol  and IV hydralazine  as needed  8)COPD - Not in exacerbation  - Continue short-acting bronchodilators as-needed   9) chronic anemia--Hgb down to 10.9, Hgb was high on admission due to hemoconcentration - Hgb is  down now due to hemodilution from IV fluids - No obvious bleeding - Prior history of anemia noted.  10) lumbar back pain - Continue as needed Tylenol  and tramadol  for severe pain - Follow-up as needed Robaxin  has also been started - Patient instructed to continue repositioning.  Disposition--as of 03/18/2024 patient is medically ready for discharge home with OPAT for IV Rocephin /IV vancomycin  in meningitic doses as recommended by ID physicians--- however patient has no insurance and no ability to pay for home infusion of above antibiotics -- She will stay in the hospital to complete IV antibiotics last dose of IV antibiotics will be 03/24/2024  Status is: Inpatient   Disposition: The patient is from: Home              Anticipated d/c is to: TBD              Anticipated d/c date is: > 3 days              Patient currently is not medically stable to d/c. Barriers: Not Clinically Stable-   Code Status :  -  Code Status: Full Code   Family Communication:    Discussed with boyfriend of 8 years Tim at bedside  DVT Prophylaxis  :   - SCDs   enoxaparin  (LOVENOX ) injection 40 mg Start: 03/14/24 2200   Lab Results  Component Value Date   PLT 237 03/18/2024   Inpatient Medications  Scheduled Meds:  cloNIDine   0.1 mg Transdermal Weekly    enoxaparin  (LOVENOX ) injection  40 mg Subcutaneous Q24H   labetalol   20 mg Intravenous Once   metoprolol  tartrate  25 mg Oral BID   nicotine   21 mg Transdermal Daily   pantoprazole   40 mg Oral QAC breakfast   QUEtiapine   400 mg Oral QHS   sodium chloride  flush  3 mL Intravenous Q12H   zolpidem   5 mg Oral QHS   Continuous Infusions:  sodium chloride  10 mL/hr at 03/18/24 1618   cefTRIAXone  (ROCEPHIN )  IV 2 g (03/21/24 1010)   vancomycin  1,000 mg (03/21/24 1146)   PRN Meds:.acetaminophen  **OR** acetaminophen , clonazePAM , diclofenac  Sodium, hydrALAZINE , ipratropium-albuterol , methocarbamol , ondansetron  (ZOFRAN ) IV, mouth rinse, traMADol , trimethobenzamide    Anti-infectives (From admission, onward)    Start     Dose/Rate Route Frequency Ordered Stop   03/18/24 2000  vancomycin  (VANCOCIN ) IVPB 1000 mg/200 mL premix  Status:  Discontinued        1,000 mg 200 mL/hr over 60 Minutes Intravenous Every 12 hours 03/18/24 0952 03/18/24 1003   03/18/24 1030  vancomycin  (VANCOCIN ) IVPB 1000 mg/200 mL premix        1,000 mg 200 mL/hr over 60 Minutes Intravenous Every 12 hours 03/18/24 1003     03/15/24 1600  ampicillin  (OMNIPEN) 2 g in sodium chloride  0.9 % 100 mL IVPB  Status:  Discontinued        2 g 300 mL/hr over 20 Minutes Intravenous Every 4 hours 03/15/24 1513 03/19/24 1005   03/15/24 1100  acyclovir  (ZOVIRAX ) 500 mg in dextrose  5 % 100 mL IVPB  Status:  Discontinued        500 mg 110 mL/hr over 60 Minutes Intravenous Every 8 hours 03/15/24 0911 03/16/24 1844   03/15/24 1000  cefTRIAXone  (ROCEPHIN ) 2 g in sodium chloride  0.9 % 100 mL IVPB        2 g 200 mL/hr over 30 Minutes Intravenous Every 12 hours 03/15/24 0858     03/15/24 0800  vancomycin  (VANCOCIN ) IVPB 1000 mg/200 mL premix  Status:  Discontinued        1,000 mg 200 mL/hr over 60 Minutes Intravenous Every 24 hours 03/14/24 1107 03/18/24 0952   03/14/24 1600  ceFEPIme  (MAXIPIME ) 2 g in sodium chloride  0.9 % 100 mL IVPB  Status:   Discontinued        2 g 200 mL/hr over 30 Minutes Intravenous Every 12 hours 03/14/24 0647 03/15/24 0858   03/14/24 0800  metroNIDAZOLE  (FLAGYL ) IVPB 500 mg        500 mg 100 mL/hr over 60 Minutes Intravenous Every 12 hours 03/14/24 0639 03/16/24 0906   03/14/24 0647  vancomycin  variable dose per unstable renal function (pharmacist dosing)  Status:  Discontinued         Does not apply See admin instructions 03/14/24 0647 03/14/24 1107   03/14/24 0415  vancomycin  (VANCOCIN ) IVPB 1000 mg/200 mL premix        1,000 mg 200 mL/hr over 60 Minutes Intravenous  Once 03/14/24 0405 03/14/24 0607   03/14/24 0415  ceFEPIme  (MAXIPIME ) 2 g in sodium chloride  0.9 % 100 mL IVPB        2 g 200 mL/hr over 30 Minutes Intravenous  Once 03/14/24 0408 03/14/24 0501        Subjective: Glorine Wirthlin afebrile, no nausea, no vomiting, no chest pain, no shortness of breath.  Reporting lower back pain.  No neurologic deficits appreciated.  Objective: Vitals:   03/20/24 1347 03/20/24 1943 03/21/24 0321 03/21/24 0916  BP: 112/74 112/75 115/78 116/73  Pulse: 76 76 76 75  Resp: 17 18 18 19   Temp: 98.5 F (36.9 C) 98.8 F (37.1 C) 98.8 F (37.1 C) 98.4 F (36.9 C)  TempSrc: Oral Oral Oral Oral  SpO2: 100% 98% 98% 100%  Weight:      Height:        Intake/Output Summary (Last 24 hours) at 03/21/2024 1408 Last data filed at 03/21/2024 0800 Gross per 24 hour  Intake 1370.06 ml  Output --  Net 1370.06 ml   Filed Weights   03/14/24 0227 03/15/24 2223 03/18/24 0500  Weight: 51 kg 61.2 kg 60.6 kg    Physical Exam General exam: Alert, awake, oriented x 3; afebrile, reports no photophobia, no nausea, no vomiting.  Complaining of significant abdominal pain. Respiratory system: Clear to auscultation. Respiratory effort normal. Cardiovascular system:RRR. No murmurs, rubs, gallops. Gastrointestinal system: Abdomen is nondistended, soft and nontender. No organomegaly or masses felt. Normal bowel sounds  heard. Central nervous system:No focal neurological deficits. Extremities: No cyanosis or clubbing.  Complaining of significant lower back pain where lumbar puncture was done. Skin: No petechiae. Psychiatry: Judgement and insight appear normal.  Slightly anxious on exam.  Data Reviewed: I have personally reviewed following labs and imaging studies  CBC: Recent Labs  Lab 03/15/24 0408 03/15/24  2206 03/16/24 0931 03/18/24 0344  WBC 20.2* 20.1* 14.3* 7.0  NEUTROABS  --   --  10.0*  --   HGB 15.3* 16.6* 13.8 10.9*  HCT 44.6 49.7* 40.6 34.5*  MCV 94.1 96.3 94.6 99.7  PLT 382 382 247 237   Basic Metabolic Panel: Recent Labs  Lab 03/15/24 2206 03/16/24 0931 03/17/24 0354 03/18/24 0344 03/19/24 0434  NA 134* 136 142 141 139  K 3.3* 2.9* 3.9 3.1* 3.6  CL 105 106 113* 109 110  CO2 16* 18* 21* 24 24  GLUCOSE 112* 142* 111* 106* 92  BUN 12 9 8 7  5*  CREATININE 0.52 0.53 0.55 0.63 0.66  CALCIUM 8.0* 8.0* 8.1* 8.1* 8.0*  MG 2.1  --   --   --  2.0  PHOS  --  1.7*  --  3.2  --    GFR: Estimated Creatinine Clearance: 72.3 mL/min (by C-G formula based on SCr of 0.66 mg/dL). Liver Function Tests: Recent Labs  Lab 03/16/24 0931 03/18/24 0344  ALBUMIN 3.0* 2.8*   Recent Results (from the past 240 hours)  Blood culture (routine x 2)     Status: None   Collection Time: 03/14/24  2:50 AM   Specimen: BLOOD  Result Value Ref Range Status   Specimen Description BLOOD RIGHT ANTECUBITAL  Final   Special Requests   Final    BOTTLES DRAWN AEROBIC AND ANAEROBIC Blood Culture results may not be optimal due to an inadequate volume of blood received in culture bottles   Culture   Final    NO GROWTH 5 DAYS Performed at [facility], [address] [place], [place] [address]    Report Status 03/19/2024 FINAL  Final  Resp panel by RT-PCR (RSV, Flu A&B, Covid) Anterior Nasal Swab     Status: None   Collection Time: 03/14/24  4:07 AM   Specimen: Anterior Nasal Swab  Result Value Ref Range  Status   SARS Coronavirus 2 by RT PCR NEGATIVE NEGATIVE Final    Comment: (NOTE) SARS-CoV-2 target nucleic acids are NOT DETECTED.  The SARS-CoV-2 RNA is generally detectable in upper respiratory specimens during the acute phase of infection. The lowest concentration of SARS-CoV-2 viral copies this assay can detect is 138 copies/mL. A negative result does not preclude SARS-Cov-2 infection and should not be used as the sole basis for treatment or other patient management decisions. A negative result may occur with  improper specimen collection/handling, submission of specimen other than nasopharyngeal swab, presence of viral mutation(s) within the areas targeted by this assay, and inadequate number of viral copies(<138 copies/mL). A negative result must be combined with clinical observations, patient history, and epidemiological information. The expected result is Negative.  Fact Sheet for Patients:  BloggerCourse.com  Fact Sheet for Healthcare Providers:  SeriousBroker.it  This test is no t yet approved or cleared by the [place]  FDA and  has been authorized for detection and/or diagnosis of SARS-CoV-2 by FDA under an Emergency Use Authorization (EUA). This EUA will remain  in effect (meaning this test can be used) for the duration of the COVID-19 declaration under Section 564(b)(1) of the Act, 21 U.S.C.section 360bbb-3(b)(1), unless the authorization is terminated  or revoked sooner.       Influenza A by PCR NEGATIVE NEGATIVE Final   Influenza B by PCR NEGATIVE NEGATIVE Final    Comment: (NOTE) The Xpert Xpress SARS-CoV-2/FLU/RSV plus assay is intended as an aid in the diagnosis of influenza from Nasopharyngeal swab specimens and  should not be used as a sole basis for treatment. Nasal washings and aspirates are unacceptable for Xpert Xpress SARS-CoV-2/FLU/RSV testing.  Fact Sheet for  Patients: BloggerCourse.com  Fact Sheet for Healthcare Providers: SeriousBroker.it  This test is not yet approved or cleared by the [place]  FDA and has been authorized for detection and/or diagnosis of SARS-CoV-2 by FDA under an Emergency Use Authorization (EUA). This EUA will remain in effect (meaning this test can be used) for the duration of the COVID-19 declaration under Section 564(b)(1) of the Act, 21 U.S.C. section 360bbb-3(b)(1), unless the authorization is terminated or revoked.     Resp Syncytial Virus by PCR NEGATIVE NEGATIVE Final    Comment: (NOTE) Fact Sheet for Patients: BloggerCourse.com  Fact Sheet for Healthcare Providers: SeriousBroker.it  This test is not yet approved or cleared by the [place]  FDA and has been authorized for detection and/or diagnosis of SARS-CoV-2 by FDA under an Emergency Use Authorization (EUA). This EUA will remain in effect (meaning this test can be used) for the duration of the COVID-19 declaration under Section 564(b)(1) of the Act, 21 U.S.C. section 360bbb-3(b)(1), unless the authorization is terminated or revoked.  Performed at [facility], [address] [place], [place] [address]   Blood culture (routine x 2)     Status: None   Collection Time: 03/14/24  5:20 AM   Specimen: BLOOD  Result Value Ref Range Status   Specimen Description BLOOD BLOOD LEFT WRIST  Final   Special Requests   Final    BOTTLES DRAWN AEROBIC AND ANAEROBIC Blood Culture adequate volume   Culture   Final    NO GROWTH 5 DAYS Performed at [facility], [address], [place], [place] [address]    Report Status 03/19/2024 FINAL  Final  MRSA Next Gen by PCR, Nasal     Status: None   Collection Time: 03/15/24 10:27 PM   Specimen: Nasal Mucosa; Nasal Swab  Result Value Ref Range Status   MRSA by PCR Next Gen NOT DETECTED NOT DETECTED Final     Comment: (NOTE) The GeneXpert MRSA Assay (FDA approved for NASAL specimens only), is one component of a comprehensive MRSA colonization surveillance program. It is not intended to diagnose MRSA infection nor to guide or monitor treatment for MRSA infections. Test performance is not FDA approved in patients less than 60 years old. Performed at [facility], [address] [place], [place] [address]   CSF culture w Gram Stain     Status: None   Collection Time: 03/16/24 12:03 PM   Specimen: Lumbar Puncture; Cerebrospinal Fluid  Result Value Ref Range Status   Specimen Description   Final    LUMBAR Performed at [facility], [address] [place], [place] [address]    Special Requests   Final    Normal Performed at [facility], [address] [place], [place] [address]    Gram Stain   Final    NO ORGANISMS SEEN NO WBC SEEN CYTOSPIN SMEAR CSF Gram Stain Report Called to,Read Back By and Verified With: COOK @ 1421 ON [id number] BY HENDERSON L Performed at [facility], [address] [place], [place] [address]    Culture   Final    NO GROWTH 3 DAYS Performed at [facility], [address], [place], [place] [address]    Report Status 03/19/2024 FINAL  Final  Fungus Culture Without Stain     Status: None (Preliminary result)   Collection Time: 03/16/24 12:03 PM  Specimen: CSF; Other  Result Value Ref Range Status   Specimen Description   Final    CSF Performed at [facility], [address] [place], [place] [address]    Special Requests   Final    NONE Performed at [facility], [address] [place], [place] [address]    Culture   Final    NO FUNGUS ISOLATED AFTER 5 DAYS Performed at [facility], [address] [place], [place] [address]    Report Status PENDING  Incomplete    Radiology Studies: No results found.  Scheduled Meds:  cloNIDine   0.1 mg Transdermal Weekly   enoxaparin  (LOVENOX ) injection  40 mg Subcutaneous Q24H   labetalol   20 mg  Intravenous Once   metoprolol  tartrate  25 mg Oral BID   nicotine   21 mg Transdermal Daily   pantoprazole   40 mg Oral QAC breakfast   QUEtiapine   400 mg Oral QHS   sodium chloride  flush  3 mL Intravenous Q12H   zolpidem   5 mg Oral QHS   Continuous Infusions:  sodium chloride  10 mL/hr at 03/18/24 1618   cefTRIAXone  (ROCEPHIN )  IV 2 g (03/21/24 1010)   vancomycin  1,000 mg (03/21/24 1146)    LOS: 7 days   Eric Nunnery M.D on 03/21/2024 at 2:08 PM  Go to [email or web address] - for contact info  [facility] - Office  [phone]  If 7PM-7AM, please contact night-coverage [email or web address] 03/21/2024, 2:08 PM

## 2024-03-21 NOTE — Plan of Care (Signed)
  Problem: Education: Goal: Knowledge of General Education information will improve Description: Including pain rating scale, medication(s)/side effects and non-pharmacologic comfort measures Outcome: Progressing   Problem: Health Behavior/Discharge Planning: Goal: Ability to manage health-related needs will improve Outcome: Progressing   Problem: Clinical Measurements: Goal: Diagnostic test results will improve Outcome: Progressing   Problem: Coping: Goal: Level of anxiety will decrease Outcome: Progressing   

## 2024-03-21 NOTE — Progress Notes (Addendum)
 Pharmacy Antibiotic Note  Sherry [name] is a 53 y.o. female admitted on 03/14/2024 with altered mental status.  Pharmacy has been consulted for Vancomycin  dosing. 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 AF , PCT <0.1 WBC WNL  LP done after abx started. 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 Plan is for 10 days of IV abx VP= 24mcg/ml VT = 13 mcg/ml, Expected AUC 500, continue current regimen of 1gm IV q24h Last day of tx 03/23/24  Plan: Continue Vancomycin  1gm IV q12h Continue Ceftriaxone  2g q12 per MD Continue Ampicillin  2gm IV q4h F/U cxs and clinical progress Monitor V/S, labs and levels as indicated  Height: 5' 5 (165.1 cm) Weight: 60.6 kg (133 lb 9.6 oz) IBW/kg (Calculated) : 57  Temp (24hrs), Avg:98.6 F (37 C), Min:98.4 F (36.9 C), Max:98.8 F (37.1 C)  Recent Labs  Lab 03/15/24 0408 03/15/24 2206 03/16/24 0931 03/17/24 0354 03/18/24 0344 03/19/24 0434 03/21/24 0039 03/21/24 1012  WBC 20.2* 20.1* 14.3*  --  7.0  --   --   --   CREATININE 0.64 0.52 0.53 0.55 0.63 0.66  --   --   LATICACIDVEN  --  1.0  --   --   --   --   --   --   VANCOTROUGH  --   --   --   --  6*  --   --  13*  VANCOPEAK  --   --   --   --   --   --  31  --     Estimated Creatinine Clearance: 72.3 mL/min (by C-G formula based on SCr of 0.66 mg/dL).    Allergies  Allergen Reactions   Dramamine [Dimenhydrinate] Swelling and Other (See Comments)    Made her feel really drunk. Swelling of tongue     Prednisone  Other (See Comments)    Feels like fingers are swelling    Antimicrobials this admission: Vanc 9/3> Cefepime  9/3 Ceftriaxone  9/4 (meningitis dosing)> Ampicillin  9/4>> Acyclovir  9/4> 9/5 Flagyl  9/3> 9/5  Microbiology results: 9/5 CSF: ngtd 9/3 bldx2: ngtd 9/4 MRSA PCR neg   Thank you for allowing pharmacy to be a part of this patient's care.  Markell Schrier, BS  Pharm D, BCPS Clinical Pharmacist 03/21/2024 11:02 AM

## 2024-03-22 DIAGNOSIS — I10 Essential (primary) hypertension: Secondary | ICD-10-CM | POA: Diagnosis not present

## 2024-03-22 DIAGNOSIS — J449 Chronic obstructive pulmonary disease, unspecified: Secondary | ICD-10-CM | POA: Diagnosis not present

## 2024-03-22 DIAGNOSIS — R6510 Systemic inflammatory response syndrome (SIRS) of non-infectious origin without acute organ dysfunction: Secondary | ICD-10-CM | POA: Diagnosis not present

## 2024-03-22 DIAGNOSIS — G9341 Metabolic encephalopathy: Secondary | ICD-10-CM | POA: Diagnosis not present

## 2024-03-22 DIAGNOSIS — E876 Hypokalemia: Secondary | ICD-10-CM | POA: Diagnosis not present

## 2024-03-22 DIAGNOSIS — G9340 Encephalopathy, unspecified: Secondary | ICD-10-CM | POA: Diagnosis not present

## 2024-03-22 MED ORDER — HYDROCORTISONE 1 % EX CREA
TOPICAL_CREAM | Freq: Three times a day (TID) | CUTANEOUS | Status: DC | PRN
  Filled 2024-03-22: qty 28

## 2024-03-22 MED ORDER — SACCHAROMYCES BOULARDII 250 MG PO CAPS
250.0000 mg | ORAL_CAPSULE | Freq: Two times a day (BID) | ORAL | Status: DC
  Administered 2024-03-22 – 2024-03-24 (×4): 250 mg via ORAL
  Filled 2024-03-22 (×4): qty 1

## 2024-03-22 NOTE — Plan of Care (Signed)

## 2024-03-22 NOTE — Plan of Care (Signed)

## 2024-03-22 NOTE — Progress Notes (Signed)
 PROGRESS NOTE  Sherry [name], is a 53 y.o. female, DOB - 08-13-1970, FMW:[id number]  Admit date - 03/14/2024   Admitting Physician Courage Pearlean, MD  Outpatient Primary MD for the patient is [name], MD  LOS - 8  Chief Complaint  Patient presents with   Altered Mental Status      Brief Narrative:   53 y.o. female with medical history significant for hypertension, hyperlipidemia, depression, anxiety, and COPD who presents with confusion, agitation, cough, and shortness of breath admitted on 03/13/2024 with acute encephalopathy in the setting of recent episodes of emesis and diarrhea,  meets SIRS criteria and elevated lactate  --- Concerns for encephalitis/meningitis--please see MRI brain findings - Had LP on 03/16/2024---studies mostly negative -Rescinded IVC on 03/17/24 -- -Disposition--as of 03/18/2024 patient is medically ready for discharge home with OPAT for IV Rocephin /IV vancomycin  in meningitic doses as recommended by ID physicians--- however patient has no insurance and no ability to pay for home infusion of above antibiotics -- She will stay in the hospital to complete IV antibiotics last dose of IV antibiotics will be 03/24/2024   -Assessment and Plan: 1) Acute Encephalopathy --??  Metabolic in the setting of dehydration and electrolyte derangement or Infectious Given fevers and GI symptoms---Concerns for encephalitis/meningitis - -CT head without acute findings -MRI brain without contrast with possible  encephalitides of various etiologies (including infectious and autoimmune). Atypical distribution of PRES is also a possibility-- Serum ammonia WNL  -UDS with benzos and THC - TSH and B12 WNL -PTA patient was on benzos UDS confirm please avoid abrupt discontinuation of benzos --Infectious disease and neurology consult requested Transferred to [facility]  For lumbar puncture and further neurology and ID consults -MRI head without contrast---without septic emboli -MRI  brain with contrast to evaluate for meningitis/encephalitis pending --No rash, no recent travel ,no insect bites -Lumbar puncture with CSF studies requested --Received cefepime  Vanco and Flagyl  initially -Changed to Rocephin  (meningitic doses) and vancomycin  -Acyclovir  added on 03/15/2024, discontinued on 03/17/2023 -ID physician recommends  Rocephin /vancomycin --plan will be to treat with meningitis appropriate antibiotics for total of 10 days--last dose 03/24/24 -- Last dose of ampicillin  03/19/2024 Sed rate 3, CRP 0.5 WBC 27.6 >>28.4 >>20.2>>14.3>>7.0 -Mentation is back to baseline as of 03/16/2024 -Tolerated LP well on 03/16/2024---LP on day 4 of abx ->21 opening pressure, 8 wbc, , 2 rbc, 62 glc, 28 ptn, ME panel negative  -CSF Gram stain negative, CSF culture NGTD, blood cultures NGTD ---- EEG without epileptiform findings --Rescinded IVC on 03/17/24   2)SIRS dehydration with lactic acidosis--POA - Leukocytosis, tachypnea, and tachycardia present on admission  - Abdomen is soft and no-tender, there is no conspicuous pneumonia on CXR, no bacteriuria or pyuria, no meningismus, no apparent cellulitis  -Blood and CSF cultures NGTD SIRs pathophysiology has resolved - Antibiotics as above #1 --Please see ID physician consult note/H&P dated 03/19/2024   3. AKI -- -acute kidney injury  -   creatinine on admission= 1.07,  baseline creatinine = 0.5-0.6   ,  creatinine is now= 0.6,  -- AKI resolved with IV fluids Renally adjust medications, avoid nephrotoxic agents / dehydration  / hypotension  4.  Recurrent emesis and diarrhea--- abdominal x-ray without obstructive findings or any other acute findings - No further episode of diarrhea or loose stools - Overall much improved - No nausea, no vomiting, no abdominal pain. - GI panel has been discontinue and no need for enteric precaution.  5. Hypokalemia/hyponatremia/hypophosphatemia--due to GI losses in the setting of  emesis and diarrhea prior to  admission as well as poor oral intake - Sodium normalized with hydration -Magnesium  WNL - Continue to follow electrolytes and replete as needed. - Patient denies any further episode of diarrhea or loose stools. - Continue to maintain adequate hydration.  6. Depression/Anxiety  -c/n needed clonazepam    7. Hypertension  - -MRI brain suggest possible atypical distribution of PRES --BP still not at goal, but overall BP trend improving - c/n topical clonidine  patch  --Anticipate that BP will improve further now that patient is alert and awake and able to take oral medications -- May alternate IV labetalol  and IV hydralazine  as needed  8)COPD - Not in exacerbation  - Continue short-acting bronchodilators as-needed   9) chronic anemia--Hgb down to 10.9, Hgb was high on admission due to hemoconcentration - Hgb is  down now due to hemodilution from IV fluids - No obvious bleeding - Prior history of anemia noted.  10) lumbar back pain - Continue as needed Tylenol  and tramadol  for severe pain - Follow-up as needed Robaxin  has also been started - Patient instructed to continue repositioning.  Disposition--as of 03/18/2024 patient is medically ready for discharge home with OPAT for IV Rocephin /IV vancomycin  in meningitic doses as recommended by ID physicians--- however patient has no insurance and no ability to pay for home infusion of above antibiotics -- She will stay in the hospital to complete IV antibiotics last dose of IV antibiotics will be 03/24/2024  Status is: Inpatient   Disposition: The patient is from: Home              Anticipated d/c is to: TBD              Anticipated d/c date is: > 3 days              Patient currently is not medically stable to d/c. Barriers: Not Clinically Stable-   Code Status :  -  Code Status: Full Code   Family Communication:    Discussed with boyfriend of 8 years Tim at bedside  DVT Prophylaxis  :   - SCDs   enoxaparin  (LOVENOX ) injection 40 mg  Start: 03/14/24 2200   Lab Results  Component Value Date   PLT 237 03/18/2024   Inpatient Medications  Scheduled Meds:  cloNIDine   0.1 mg Transdermal Weekly   enoxaparin  (LOVENOX ) injection  40 mg Subcutaneous Q24H   labetalol   20 mg Intravenous Once   metoprolol  tartrate  25 mg Oral BID   nicotine   21 mg Transdermal Daily   pantoprazole   40 mg Oral QAC breakfast   QUEtiapine   400 mg Oral QHS   saccharomyces boulardii  250 mg Oral BID   sodium chloride  flush  3 mL Intravenous Q12H   zolpidem   5 mg Oral QHS   Continuous Infusions:  sodium chloride  10 mL/hr at 03/22/24 0358   cefTRIAXone  (ROCEPHIN )  IV 2 g (03/22/24 0828)   vancomycin  1,000 mg (03/22/24 0923)   PRN Meds:.acetaminophen  **OR** acetaminophen , clonazePAM , diclofenac  Sodium, hydrALAZINE , hydrocortisone  cream, ipratropium-albuterol , methocarbamol , ondansetron  (ZOFRAN ) IV, mouth rinse, traMADol , trimethobenzamide    Anti-infectives (From admission, onward)    Start     Dose/Rate Route Frequency Ordered Stop   03/18/24 2000  vancomycin  (VANCOCIN ) IVPB 1000 mg/200 mL premix  Status:  Discontinued        1,000 mg 200 mL/hr over 60 Minutes Intravenous Every 12 hours 03/18/24 0952 03/18/24 1003   03/18/24 1030  vancomycin  (VANCOCIN ) IVPB 1000 mg/200 mL premix  1,000 mg 200 mL/hr over 60 Minutes Intravenous Every 12 hours 03/18/24 1003     03/15/24 1600  ampicillin  (OMNIPEN) 2 g in sodium chloride  0.9 % 100 mL IVPB  Status:  Discontinued        2 g 300 mL/hr over 20 Minutes Intravenous Every 4 hours 03/15/24 1513 03/19/24 1005   03/15/24 1100  acyclovir  (ZOVIRAX ) 500 mg in dextrose  5 % 100 mL IVPB  Status:  Discontinued        500 mg 110 mL/hr over 60 Minutes Intravenous Every 8 hours 03/15/24 0911 03/16/24 1844   03/15/24 1000  cefTRIAXone  (ROCEPHIN ) 2 g in sodium chloride  0.9 % 100 mL IVPB        2 g 200 mL/hr over 30 Minutes Intravenous Every 12 hours 03/15/24 0858     03/15/24 0800  vancomycin  (VANCOCIN ) IVPB  1000 mg/200 mL premix  Status:  Discontinued        1,000 mg 200 mL/hr over 60 Minutes Intravenous Every 24 hours 03/14/24 1107 03/18/24 0952   03/14/24 1600  ceFEPIme  (MAXIPIME ) 2 g in sodium chloride  0.9 % 100 mL IVPB  Status:  Discontinued        2 g 200 mL/hr over 30 Minutes Intravenous Every 12 hours 03/14/24 0647 03/15/24 0858   03/14/24 0800  metroNIDAZOLE  (FLAGYL ) IVPB 500 mg        500 mg 100 mL/hr over 60 Minutes Intravenous Every 12 hours 03/14/24 0639 03/16/24 0906   03/14/24 0647  vancomycin  variable dose per unstable renal function (pharmacist dosing)  Status:  Discontinued         Does not apply See admin instructions 03/14/24 0647 03/14/24 1107   03/14/24 0415  vancomycin  (VANCOCIN ) IVPB 1000 mg/200 mL premix        1,000 mg 200 mL/hr over 60 Minutes Intravenous  Once 03/14/24 0405 03/14/24 0607   03/14/24 0415  ceFEPIme  (MAXIPIME ) 2 g in sodium chloride  0.9 % 100 mL IVPB        2 g 200 mL/hr over 30 Minutes Intravenous  Once 03/14/24 0408 03/14/24 0501        Subjective: Kareli Kesling no fever, no chest pain, no vomiting, not shortness of breath.  No overnight events.  Patient reports no further episode of diarrhea or loose stools.  Objective: Vitals:   03/21/24 1300 03/21/24 1933 03/22/24 0204 03/22/24 1322  BP: 107/75 95/68 101/86 109/75  Pulse: 79 89 84 71  Resp: 18 18 18    Temp: 98.4 F (36.9 C) 98.4 F (36.9 C) 98.1 F (36.7 C) 98.3 F (36.8 C)  TempSrc: Oral Oral Oral Oral  SpO2: 100% 97% 99% 96%  Weight:      Height:        Intake/Output Summary (Last 24 hours) at 03/22/2024 1837 Last data filed at 03/22/2024 1500 Gross per 24 hour  Intake 962.75 ml  Output --  Net 962.75 ml   Filed Weights   03/14/24 0227 03/15/24 2223 03/18/24 0500  Weight: 51 kg 61.2 kg 60.6 kg    Physical Exam General exam: Alert, awake, oriented x 3; no headaches, no photophobia, no fever, no chest pain or shortness of breath. Respiratory system: Clear to auscultation.  Respiratory effort normal.  Good saturation on room air. Cardiovascular system:RRR. No murmurs, rubs, gallops. Gastrointestinal system: Abdomen is nondistended, soft and nontender. No organomegaly or masses felt. Normal bowel sounds heard. Central nervous system: Alert and oriented. No focal neurological deficits. Extremities: No cyanosis or clubbing; no  edema.  Reporting some improvement in lower back pain. Skin: No petechiae. Psychiatry: Judgement and insight appear normal. Mood & affect appropriate.   Data Reviewed: I have personally reviewed following labs and imaging studies  CBC: Recent Labs  Lab 03/15/24 2206 03/16/24 0931 03/18/24 0344  WBC 20.1* 14.3* 7.0  NEUTROABS  --  10.0*  --   HGB 16.6* 13.8 10.9*  HCT 49.7* 40.6 34.5*  MCV 96.3 94.6 99.7  PLT 382 247 237   Basic Metabolic Panel: Recent Labs  Lab 03/15/24 2206 03/16/24 0931 03/17/24 0354 03/18/24 0344 03/19/24 0434  NA 134* 136 142 141 139  K 3.3* 2.9* 3.9 3.1* 3.6  CL 105 106 113* 109 110  CO2 16* 18* 21* 24 24  GLUCOSE 112* 142* 111* 106* 92  BUN 12 9 8 7  5*  CREATININE 0.52 0.53 0.55 0.63 0.66  CALCIUM 8.0* 8.0* 8.1* 8.1* 8.0*  MG 2.1  --   --   --  2.0  PHOS  --  1.7*  --  3.2  --    GFR: Estimated Creatinine Clearance: 72.3 mL/min (by C-G formula based on SCr of 0.66 mg/dL). Liver Function Tests: Recent Labs  Lab 03/16/24 0931 03/18/24 0344  ALBUMIN 3.0* 2.8*   Recent Results (from the past 240 hours)  Blood culture (routine x 2)     Status: None   Collection Time: 03/14/24  2:50 AM   Specimen: BLOOD  Result Value Ref Range Status   Specimen Description BLOOD RIGHT ANTECUBITAL  Final   Special Requests   Final    BOTTLES DRAWN AEROBIC AND ANAEROBIC Blood Culture results may not be optimal due to an inadequate volume of blood received in culture bottles   Culture   Final    NO GROWTH 5 DAYS Performed at [facility], [address], [place], [place] [address]    Report Status 03/19/2024  FINAL  Final  Resp panel by RT-PCR (RSV, Flu A&B, Covid) Anterior Nasal Swab     Status: None   Collection Time: 03/14/24  4:07 AM   Specimen: Anterior Nasal Swab  Result Value Ref Range Status   SARS Coronavirus 2 by RT PCR NEGATIVE NEGATIVE Final    Comment: (NOTE) SARS-CoV-2 target nucleic acids are NOT DETECTED.  The SARS-CoV-2 RNA is generally detectable in upper respiratory specimens during the acute phase of infection. The lowest concentration of SARS-CoV-2 viral copies this assay can detect is 138 copies/mL. A negative result does not preclude SARS-Cov-2 infection and should not be used as the sole basis for treatment or other patient management decisions. A negative result may occur with  improper specimen collection/handling, submission of specimen other than nasopharyngeal swab, presence of viral mutation(s) within the areas targeted by this assay, and inadequate number of viral copies(<138 copies/mL). A negative result must be combined with clinical observations, patient history, and epidemiological information. The expected result is Negative.  Fact Sheet for Patients:  BloggerCourse.com  Fact Sheet for Healthcare Providers:  SeriousBroker.it  This test is no t yet approved or cleared by the [place]  FDA and  has been authorized for detection and/or diagnosis of SARS-CoV-2 by FDA under an Emergency Use Authorization (EUA). This EUA will remain  in effect (meaning this test can be used) for the duration of the COVID-19 declaration under Section 564(b)(1) of the Act, 21 U.S.C.section 360bbb-3(b)(1), unless the authorization is terminated  or revoked sooner.       Influenza A by PCR NEGATIVE NEGATIVE Final  Influenza B by PCR NEGATIVE NEGATIVE Final    Comment: (NOTE) The Xpert Xpress SARS-CoV-2/FLU/RSV plus assay is intended as an aid in the diagnosis of influenza from Nasopharyngeal swab specimens and should  not be used as a sole basis for treatment. Nasal washings and aspirates are unacceptable for Xpert Xpress SARS-CoV-2/FLU/RSV testing.  Fact Sheet for Patients: BloggerCourse.com  Fact Sheet for Healthcare Providers: SeriousBroker.it  This test is not yet approved or cleared by the [place]  FDA and has been authorized for detection and/or diagnosis of SARS-CoV-2 by FDA under an Emergency Use Authorization (EUA). This EUA will remain in effect (meaning this test can be used) for the duration of the COVID-19 declaration under Section 564(b)(1) of the Act, 21 U.S.C. section 360bbb-3(b)(1), unless the authorization is terminated or revoked.     Resp Syncytial Virus by PCR NEGATIVE NEGATIVE Final    Comment: (NOTE) Fact Sheet for Patients: BloggerCourse.com  Fact Sheet for Healthcare Providers: SeriousBroker.it  This test is not yet approved or cleared by the [place]  FDA and has been authorized for detection and/or diagnosis of SARS-CoV-2 by FDA under an Emergency Use Authorization (EUA). This EUA will remain in effect (meaning this test can be used) for the duration of the COVID-19 declaration under Section 564(b)(1) of the Act, 21 U.S.C. section 360bbb-3(b)(1), unless the authorization is terminated or revoked.  Performed at [facility], [address] [place], [place] [address]   Blood culture (routine x 2)     Status: None   Collection Time: 03/14/24  5:20 AM   Specimen: BLOOD  Result Value Ref Range Status   Specimen Description BLOOD BLOOD LEFT WRIST  Final   Special Requests   Final    BOTTLES DRAWN AEROBIC AND ANAEROBIC Blood Culture adequate volume   Culture   Final    NO GROWTH 5 DAYS Performed at [facility], [address] [place], [place] [address]    Report Status 03/19/2024 FINAL  Final  MRSA Next Gen by PCR, Nasal     Status: None   Collection  Time: 03/15/24 10:27 PM   Specimen: Nasal Mucosa; Nasal Swab  Result Value Ref Range Status   MRSA by PCR Next Gen NOT DETECTED NOT DETECTED Final    Comment: (NOTE) The GeneXpert MRSA Assay (FDA approved for NASAL specimens only), is one component of a comprehensive MRSA colonization surveillance program. It is not intended to diagnose MRSA infection nor to guide or monitor treatment for MRSA infections. Test performance is not FDA approved in patients less than 73 years old. Performed at [facility], [address], [place], [place] [address]   CSF culture w Gram Stain     Status: None   Collection Time: 03/16/24 12:03 PM   Specimen: Lumbar Puncture; Cerebrospinal Fluid  Result Value Ref Range Status   Specimen Description   Final    LUMBAR Performed at [facility], [address], [place], [place] [address]    Special Requests   Final    Normal Performed at [facility], [address], [place], [place] [address]    Gram Stain   Final    NO ORGANISMS SEEN NO WBC SEEN CYTOSPIN SMEAR CSF Gram Stain Report Called to,Read Back By and Verified With: COOK @ 1421 ON [id number] BY HENDERSON L Performed at [facility], [address], [place], [place] [address]    Culture   Final    NO GROWTH 3 DAYS Performed at [facility], [address], [place], [place] [address]    Report Status 03/19/2024 FINAL  Final  Fungus Culture Without Stain     Status: None (Preliminary result)   Collection Time: 03/16/24 12:03 PM   Specimen: CSF; Other  Result Value Ref Range Status   Specimen Description   Final    CSF Performed at [facility], [address], [place], [place] [address]    Special Requests   Final    NONE Performed at [facility], [address] [place], [place] [address]    Culture   Final    NO FUNGUS ISOLATED AFTER 6 DAYS Performed at [facility], [address] [place], [place] [address]    Report Status PENDING  Incomplete    Radiology Studies: No  results found.  Scheduled Meds:  cloNIDine   0.1 mg Transdermal Weekly   enoxaparin  (LOVENOX ) injection  40 mg Subcutaneous Q24H   labetalol   20 mg Intravenous Once   metoprolol  tartrate  25 mg Oral BID   nicotine   21 mg Transdermal Daily   pantoprazole   40 mg Oral QAC breakfast   QUEtiapine   400 mg Oral QHS   saccharomyces boulardii  250 mg Oral BID   sodium chloride  flush  3 mL Intravenous Q12H   zolpidem   5 mg Oral QHS   Continuous Infusions:  sodium chloride  10 mL/hr at 03/22/24 0358   cefTRIAXone  (ROCEPHIN )  IV 2 g (03/22/24 0828)   vancomycin  1,000 mg (03/22/24 0923)    LOS: 8 days   Eric Nunnery M.D on 03/22/2024 at 6:37 PM  Go to [email or web address] - for contact info  [facility] - Office  [phone]  If 7PM-7AM, please contact night-coverage [email or web address] 03/22/2024, 6:37 PM

## 2024-03-23 DIAGNOSIS — G9341 Metabolic encephalopathy: Secondary | ICD-10-CM

## 2024-03-23 DIAGNOSIS — R6510 Systemic inflammatory response syndrome (SIRS) of non-infectious origin without acute organ dysfunction: Secondary | ICD-10-CM | POA: Diagnosis not present

## 2024-03-23 DIAGNOSIS — G9340 Encephalopathy, unspecified: Secondary | ICD-10-CM | POA: Diagnosis not present

## 2024-03-23 DIAGNOSIS — A419 Sepsis, unspecified organism: Secondary | ICD-10-CM | POA: Diagnosis not present

## 2024-03-23 DIAGNOSIS — J449 Chronic obstructive pulmonary disease, unspecified: Secondary | ICD-10-CM | POA: Diagnosis not present

## 2024-03-23 DIAGNOSIS — I10 Essential (primary) hypertension: Secondary | ICD-10-CM

## 2024-03-23 DIAGNOSIS — E876 Hypokalemia: Secondary | ICD-10-CM | POA: Diagnosis not present

## 2024-03-23 DIAGNOSIS — Z419 Encounter for procedure for purposes other than remedying health state, unspecified: Secondary | ICD-10-CM | POA: Diagnosis not present

## 2024-03-23 LAB — CREATININE, SERUM
Creatinine, Ser: 0.68 mg/dL (ref 0.44–1.00)
GFR, Estimated: >60 mL/min (ref >60)

## 2024-03-23 LAB — OLIGOCLONAL BANDS, CSF + SERM

## 2024-03-23 NOTE — Plan of Care (Signed)

## 2024-03-23 NOTE — Progress Notes (Signed)
 PROGRESS NOTE  Sherry [name], is a 53 y.o. female, DOB - 1970-11-18, FMW:[id number]  Admit date - 03/14/2024   Admitting Physician Courage Pearlean, MD  Outpatient Primary MD for the patient is Sherry Aquas, MD  LOS - 9  Chief Complaint  Patient presents with   Altered Mental Status      Brief Narrative:   53 y.o. female with medical history significant for hypertension, hyperlipidemia, depression, anxiety, and COPD who presents with confusion, agitation, cough, and shortness of breath admitted on 03/13/2024 with acute encephalopathy in the setting of recent episodes of emesis and diarrhea,  meets SIRS criteria and elevated lactate  --- Concerns for encephalitis/meningitis--please see MRI brain findings - Had LP on 03/16/2024---studies mostly negative -Rescinded IVC on 03/17/24 -- -Disposition--as of 03/18/2024 patient is medically ready for discharge home with OPAT for IV Rocephin /IV vancomycin  in meningitic doses as recommended by ID physicians--- however patient has no insurance and no ability to pay for home infusion of above antibiotics -- She will stay in the hospital to complete IV antibiotics last dose of IV antibiotics will be 03/24/2024   -Assessment and Plan: 1) Acute Encephalopathy --??  Metabolic in the setting of dehydration and electrolyte derangement or Infectious Given fevers and GI symptoms---Concerns for encephalitis/meningitis - -CT head without acute findings -MRI brain without contrast with possible  encephalitides of various etiologies (including infectious and autoimmune). Atypical distribution of PRES is also a possibility-- Serum ammonia WNL  -UDS with benzos and THC - TSH and B12 WNL -PTA patient was on benzos UDS confirm please avoid abrupt discontinuation of benzos --Infectious disease and neurology consult requested [facility]  For lumbar puncture and further neurology and ID consults -MRI head without contrast---without septic emboli -MRI  brain with contrast to evaluate for meningitis/encephalitis pending --No rash, no recent travel ,no insect bites -Lumbar puncture with CSF studies requested --Received cefepime  Vanco and Flagyl  initially -Changed to Rocephin  (meningitic doses) and vancomycin  -Acyclovir  added on 03/15/2024, discontinued on 03/17/2023 -ID physician recommends  Rocephin /vancomycin --plan will be to treat with meningitis appropriate antibiotics for total of 10 days--last dose 03/24/24 -- Last dose of ampicillin  03/19/2024 Sed rate 3, CRP 0.5 WBC 27.6 >>28.4 >>20.2>>14.3>>7.0 -Mentation is back to baseline as of 03/16/2024 -Tolerated LP well on 03/16/2024---LP on day 4 of abx ->21 opening pressure, 8 wbc, , 2 rbc, 62 glc, 28 ptn, ME panel negative  -CSF Gram stain negative, CSF culture NGTD, blood cultures NGTD ---- EEG without epileptiform findings --Rescinded IVC on 03/17/24   2)SIRS dehydration with lactic acidosis--POA - Leukocytosis, tachypnea, and tachycardia present on admission  - Abdomen is soft and no-tender, there is no conspicuous pneumonia on CXR, no bacteriuria or pyuria, no meningismus, no apparent cellulitis  -Blood and CSF cultures NGTD SIRs pathophysiology has resolved - Antibiotics as above #1 --Please see ID physician consult note/H&P dated 03/19/2024   3. AKI -- -acute kidney injury  -   creatinine on admission= 1.07,  baseline creatinine = 0.5-0.6   ,  creatinine is now= 0.6,  -- AKI resolved with IV fluids Renally adjust medications, avoid nephrotoxic agents / dehydration  / hypotension  4.  Recurrent emesis and diarrhea--- abdominal x-ray without obstructive findings or any other acute findings - No further episode of diarrhea or loose stools - Overall much improved - No nausea, no vomiting, no abdominal pain. - GI panel has been discontinue and no need for enteric precaution.  5. Hypokalemia/hyponatremia/hypophosphatemia--due to GI losses in the setting of  emesis and diarrhea prior to  admission as well as poor oral intake - Sodium normalized with hydration -Magnesium  WNL - Continue to follow electrolytes and replete as needed. - Patient denies any further episode of diarrhea or loose stools. - Continue to maintain adequate hydration.  6. Depression/Anxiety  -c/n needed clonazepam    7. Hypertension  - -MRI brain suggest possible atypical distribution of PRES --BP still not at goal, but overall BP trend improving - c/n topical clonidine  patch  --Anticipate that BP will improve further now that patient is alert and awake and able to take oral medications -- May alternate IV labetalol  and IV hydralazine  as needed  8)COPD - Not in exacerbation  - Continue short-acting bronchodilators as-needed   9) chronic anemia--Hgb down to 10.9, Hgb was high on admission due to hemoconcentration - Hgb is  down now due to hemodilution from IV fluids - No obvious bleeding - Prior history of anemia noted.  10) lumbar back pain - Continue as needed Tylenol  and tramadol  for severe pain - Follow-up as needed Robaxin  has also been started - Patient instructed to continue repositioning.  Disposition--as of 03/18/2024 patient is medically ready for discharge home with OPAT for IV Rocephin /IV vancomycin  in meningitic doses as recommended by ID physicians--- however patient has no insurance and no ability to pay for home infusion of above antibiotics -- She will stay in the hospital to complete IV antibiotics last dose of IV antibiotics will be 03/24/2024  Status is: Inpatient   Disposition: The patient is from: Home              Anticipated d/c is to: TBD              Anticipated d/c date is: > 3 days              Patient currently is not medically stable to d/c. Barriers: Not Clinically Stable-   Code Status :  -  Code Status: Full Code   Family Communication:    Discussed with boyfriend of 8 years Tim at bedside  DVT Prophylaxis  :   - SCDs   enoxaparin  (LOVENOX ) injection 40 mg  Start: 03/14/24 2200   Lab Results  Component Value Date   PLT 237 03/18/2024   Inpatient Medications  Scheduled Meds:  cloNIDine   0.1 mg Transdermal Weekly   enoxaparin  (LOVENOX ) injection  40 mg Subcutaneous Q24H   labetalol   20 mg Intravenous Once   metoprolol  tartrate  25 mg Oral BID   nicotine   21 mg Transdermal Daily   pantoprazole   40 mg Oral QAC breakfast   QUEtiapine   400 mg Oral QHS   saccharomyces boulardii  250 mg Oral BID   sodium chloride  flush  3 mL Intravenous Q12H   Continuous Infusions:  sodium chloride  10 mL/hr at 03/22/24 0358   cefTRIAXone  (ROCEPHIN )  IV 2 g (03/23/24 0853)   vancomycin  1,000 mg (03/23/24 1035)   PRN Meds:.acetaminophen  **OR** acetaminophen , clonazePAM , diclofenac  Sodium, hydrALAZINE , hydrocortisone  cream, ipratropium-albuterol , methocarbamol , ondansetron  (ZOFRAN ) IV, mouth rinse, traMADol , trimethobenzamide    Anti-infectives (From admission, onward)    Start     Dose/Rate Route Frequency Ordered Stop   03/18/24 2000  vancomycin  (VANCOCIN ) IVPB 1000 mg/200 mL premix  Status:  Discontinued        1,000 mg 200 mL/hr over 60 Minutes Intravenous Every 12 hours 03/18/24 0952 03/18/24 1003   03/18/24 1030  vancomycin  (VANCOCIN ) IVPB 1000 mg/200 mL premix        1,000  mg 200 mL/hr over 60 Minutes Intravenous Every 12 hours 03/18/24 1003     03/15/24 1600  ampicillin  (OMNIPEN) 2 g in sodium chloride  0.9 % 100 mL IVPB  Status:  Discontinued        2 g 300 mL/hr over 20 Minutes Intravenous Every 4 hours 03/15/24 1513 03/19/24 1005   03/15/24 1100  acyclovir  (ZOVIRAX ) 500 mg in dextrose  5 % 100 mL IVPB  Status:  Discontinued        500 mg 110 mL/hr over 60 Minutes Intravenous Every 8 hours 03/15/24 0911 03/16/24 1844   03/15/24 1000  cefTRIAXone  (ROCEPHIN ) 2 g in sodium chloride  0.9 % 100 mL IVPB        2 g 200 mL/hr over 30 Minutes Intravenous Every 12 hours 03/15/24 0858     03/15/24 0800  vancomycin  (VANCOCIN ) IVPB 1000 mg/200 mL premix   Status:  Discontinued        1,000 mg 200 mL/hr over 60 Minutes Intravenous Every 24 hours 03/14/24 1107 03/18/24 0952   03/14/24 1600  ceFEPIme  (MAXIPIME ) 2 g in sodium chloride  0.9 % 100 mL IVPB  Status:  Discontinued        2 g 200 mL/hr over 30 Minutes Intravenous Every 12 hours 03/14/24 0647 03/15/24 0858   03/14/24 0800  metroNIDAZOLE  (FLAGYL ) IVPB 500 mg        500 mg 100 mL/hr over 60 Minutes Intravenous Every 12 hours 03/14/24 0639 03/16/24 0906   03/14/24 0647  vancomycin  variable dose per unstable renal function (pharmacist dosing)  Status:  Discontinued         Does not apply See admin instructions 03/14/24 0647 03/14/24 1107   03/14/24 0415  vancomycin  (VANCOCIN ) IVPB 1000 mg/200 mL premix        1,000 mg 200 mL/hr over 60 Minutes Intravenous  Once 03/14/24 0405 03/14/24 0607   03/14/24 0415  ceFEPIme  (MAXIPIME ) 2 g in sodium chloride  0.9 % 100 mL IVPB        2 g 200 mL/hr over 30 Minutes Intravenous  Once 03/14/24 0408 03/14/24 0501        Subjective: Melisia Massoud is afebrile, denying chest pain, nausea, vomiting, shortness of breath and also expressing no headaches or photophobia.  No overnight events reported.  Objective: Vitals:   03/22/24 2046 03/23/24 0431 03/23/24 0842 03/23/24 1521  BP: (!) 151/88 (!) 136/90 125/84 110/71  Pulse: 63 (!) 56 63 68  Resp: 19 19  18   Temp: 98.5 F (36.9 C) 98.5 F (36.9 C)  98.6 F (37 C)  TempSrc:    Oral  SpO2: 97% 97% 98% 99%  Weight:      Height:        Intake/Output Summary (Last 24 hours) at 03/23/2024 1730 Last data filed at 03/23/2024 1227 Gross per 24 hour  Intake 720 ml  Output --  Net 720 ml   Filed Weights   03/14/24 0227 03/15/24 2223 03/18/24 0500  Weight: 51 kg 61.2 kg 60.6 kg    Physical Exam General exam: Alert, awake, oriented x 3; feeling well and in no acute distress. Respiratory system: Clear to auscultation. Respiratory effort normal.  Good saturation on room air. Cardiovascular system:RRR. No  murmurs, rubs, gallops. Gastrointestinal system: Abdomen is nondistended, soft and nontender. No organomegaly or masses felt. Normal bowel sounds heard. Central nervous system: Alert and oriented. No focal neurological deficits. Extremities: No cyanosis or clubbing. Skin: No rashes, lesions or ulcers Psychiatry: Judgement and insight appear normal.  Mood & affect appropriate.   Data Reviewed: I have personally reviewed following labs and imaging studies  CBC: Recent Labs  Lab 03/18/24 0344  WBC 7.0  HGB 10.9*  HCT 34.5*  MCV 99.7  PLT 237   Basic Metabolic Panel: Recent Labs  Lab 03/17/24 0354 03/18/24 0344 03/19/24 0434 03/23/24 0542  NA 142 141 139  --   K 3.9 3.1* 3.6  --   CL 113* 109 110  --   CO2 21* 24 24  --   GLUCOSE 111* 106* 92  --   BUN 8 7 5*  --   CREATININE 0.55 0.63 0.66 0.68  CALCIUM 8.1* 8.1* 8.0*  --   MG  --   --  2.0  --   PHOS  --  3.2  --   --    GFR: Estimated Creatinine Clearance: 72.3 mL/min (by C-G formula based on SCr of 0.68 mg/dL).  Liver Function Tests: Recent Labs  Lab 03/18/24 0344  ALBUMIN 2.8*   Recent Results (from the past 240 hours)  Blood culture (routine x 2)     Status: None   Collection Time: 03/14/24  2:50 AM   Specimen: BLOOD  Result Value Ref Range Status   Specimen Description BLOOD RIGHT ANTECUBITAL  Final   Special Requests   Final    BOTTLES DRAWN AEROBIC AND ANAEROBIC Blood Culture results may not be optimal due to an inadequate volume of blood received in culture bottles   Culture   Final    NO GROWTH 5 DAYS Performed at [facility], [address] [place], [place] [address]    Report Status 03/19/2024 FINAL  Final  Resp panel by RT-PCR (RSV, Flu A&B, Covid) Anterior Nasal Swab     Status: None   Collection Time: 03/14/24  4:07 AM   Specimen: Anterior Nasal Swab  Result Value Ref Range Status   SARS Coronavirus 2 by RT PCR NEGATIVE NEGATIVE Final    Comment: (NOTE) SARS-CoV-2 target nucleic acids are  NOT DETECTED.  The SARS-CoV-2 RNA is generally detectable in upper respiratory specimens during the acute phase of infection. The lowest concentration of SARS-CoV-2 viral copies this assay can detect is 138 copies/mL. A negative result does not preclude SARS-Cov-2 infection and should not be used as the sole basis for treatment or other patient management decisions. A negative result may occur with  improper specimen collection/handling, submission of specimen other than nasopharyngeal swab, presence of viral mutation(s) within the areas targeted by this assay, and inadequate number of viral copies(<138 copies/mL). A negative result must be combined with clinical observations, patient history, and epidemiological information. The expected result is Negative.  Fact Sheet for Patients:  BloggerCourse.com  Fact Sheet for Healthcare Providers:  SeriousBroker.it  This test is no t yet approved or cleared by the [place]  FDA and  has been authorized for detection and/or diagnosis of SARS-CoV-2 by FDA under an Emergency Use Authorization (EUA). This EUA will remain  in effect (meaning this test can be used) for the duration of the COVID-19 declaration under Section 564(b)(1) of the Act, 21 U.S.C.section 360bbb-3(b)(1), unless the authorization is terminated  or revoked sooner.       Influenza A by PCR NEGATIVE NEGATIVE Final   Influenza B by PCR NEGATIVE NEGATIVE Final    Comment: (NOTE) The Xpert Xpress SARS-CoV-2/FLU/RSV plus assay is intended as an aid in the diagnosis of influenza from Nasopharyngeal swab specimens and should not be used as a sole  basis for treatment. Nasal washings and aspirates are unacceptable for Xpert Xpress SARS-CoV-2/FLU/RSV testing.  Fact Sheet for Patients: BloggerCourse.com  Fact Sheet for Healthcare Providers: SeriousBroker.it  This test is not  yet approved or cleared by the [place]  FDA and has been authorized for detection and/or diagnosis of SARS-CoV-2 by FDA under an Emergency Use Authorization (EUA). This EUA will remain in effect (meaning this test can be used) for the duration of the COVID-19 declaration under Section 564(b)(1) of the Act, 21 U.S.C. section 360bbb-3(b)(1), unless the authorization is terminated or revoked.     Resp Syncytial Virus by PCR NEGATIVE NEGATIVE Final    Comment: (NOTE) Fact Sheet for Patients: BloggerCourse.com  Fact Sheet for Healthcare Providers: SeriousBroker.it  This test is not yet approved or cleared by the [place]  FDA and has been authorized for detection and/or diagnosis of SARS-CoV-2 by FDA under an Emergency Use Authorization (EUA). This EUA will remain in effect (meaning this test can be used) for the duration of the COVID-19 declaration under Section 564(b)(1) of the Act, 21 U.S.C. section 360bbb-3(b)(1), unless the authorization is terminated or revoked.  Performed at [facility], [address], [place], [place] [address]   Blood culture (routine x 2)     Status: None   Collection Time: 03/14/24  5:20 AM   Specimen: BLOOD  Result Value Ref Range Status   Specimen Description BLOOD BLOOD LEFT WRIST  Final   Special Requests   Final    BOTTLES DRAWN AEROBIC AND ANAEROBIC Blood Culture adequate volume   Culture   Final    NO GROWTH 5 DAYS Performed at [facility], [address], [place], [place] [address]    Report Status 03/19/2024 FINAL  Final  MRSA Next Gen by PCR, Nasal     Status: None   Collection Time: 03/15/24 10:27 PM   Specimen: Nasal Mucosa; Nasal Swab  Result Value Ref Range Status   MRSA by PCR Next Gen NOT DETECTED NOT DETECTED Final    Comment: (NOTE) The GeneXpert MRSA Assay (FDA approved for NASAL specimens only), is one component of a comprehensive MRSA colonization surveillance program.  It is not intended to diagnose MRSA infection nor to guide or monitor treatment for MRSA infections. Test performance is not FDA approved in patients less than 52 years old. Performed at [facility], [address], [place], [place] [address]   CSF culture w Gram Stain     Status: None   Collection Time: 03/16/24 12:03 PM   Specimen: Lumbar Puncture; Cerebrospinal Fluid  Result Value Ref Range Status   Specimen Description   Final    LUMBAR Performed at [facility], [address] [place], [place] [address]    Special Requests   Final    Normal Performed at [facility], [address] [place], [place] [address]    Gram Stain   Final    NO ORGANISMS SEEN NO WBC SEEN CYTOSPIN SMEAR CSF Gram Stain Report Called to,Read Back By and Verified With: COOK @ 1421 ON [id number] BY HENDERSON L Performed at [facility], [address], [place], [place] [address]    Culture   Final    NO GROWTH 3 DAYS Performed at [facility], [address] [place], [place] [address]    Report Status 03/19/2024 FINAL  Final  Fungus Culture Without Stain     Status: None (Preliminary result)   Collection Time: 03/16/24 12:03 PM   Specimen: CSF; Other  Result  Value Ref Range Status   Specimen Description   Final    CSF Performed at [facility], [address] [place], [place] [address]    Special Requests   Final    NONE Performed at [facility], [address] [place], [place] [address]    Culture   Final    NO FUNGUS ISOLATED AFTER 7 DAYS Performed at [facility], [address], [place], [place] [address]    Report Status PENDING  Incomplete    Radiology Studies: No results found.  Scheduled Meds:  cloNIDine   0.1 mg Transdermal Weekly   enoxaparin  (LOVENOX ) injection  40 mg Subcutaneous Q24H   labetalol   20 mg Intravenous Once   metoprolol  tartrate  25 mg Oral BID   nicotine   21 mg Transdermal Daily   pantoprazole   40 mg Oral QAC breakfast   QUEtiapine   400 mg Oral QHS    saccharomyces boulardii  250 mg Oral BID   sodium chloride  flush  3 mL Intravenous Q12H   Continuous Infusions:  sodium chloride  10 mL/hr at 03/22/24 0358   cefTRIAXone  (ROCEPHIN )  IV 2 g (03/23/24 0853)   vancomycin  1,000 mg (03/23/24 1035)    LOS: 9 days   Eric Nunnery M.D on 03/23/2024 at 5:30 PM  Go to [email or web address] - for contact info  [facility] - Office  [phone]  If 7PM-7AM, please contact night-coverage [email or web address] 03/23/2024, 5:30 PM

## 2024-03-24 DIAGNOSIS — J449 Chronic obstructive pulmonary disease, unspecified: Secondary | ICD-10-CM | POA: Diagnosis not present

## 2024-03-24 DIAGNOSIS — E876 Hypokalemia: Secondary | ICD-10-CM | POA: Diagnosis not present

## 2024-03-24 DIAGNOSIS — A419 Sepsis, unspecified organism: Secondary | ICD-10-CM | POA: Diagnosis not present

## 2024-03-24 DIAGNOSIS — R6510 Systemic inflammatory response syndrome (SIRS) of non-infectious origin without acute organ dysfunction: Secondary | ICD-10-CM | POA: Diagnosis not present

## 2024-03-24 DIAGNOSIS — G9340 Encephalopathy, unspecified: Secondary | ICD-10-CM | POA: Diagnosis not present

## 2024-03-24 DIAGNOSIS — I10 Essential (primary) hypertension: Secondary | ICD-10-CM | POA: Diagnosis not present

## 2024-03-24 DIAGNOSIS — G9341 Metabolic encephalopathy: Secondary | ICD-10-CM | POA: Diagnosis not present

## 2024-03-24 LAB — COMPREHENSIVE METABOLIC PANEL WITH GFR
ALT: 26 U/L (ref 0–44)
AST: 14 U/L — ABNORMAL LOW (ref 15–41)
Albumin: 3 g/dL — ABNORMAL LOW (ref 3.5–5.0)
Alkaline Phosphatase: 80 U/L (ref 38–126)
Anion gap: 8 (ref 5–15)
BUN: 12 mg/dL (ref 6–20)
CO2: 27 mmol/L (ref 22–32)
Calcium: 8.6 mg/dL — ABNORMAL LOW (ref 8.9–10.3)
Chloride: 103 mmol/L (ref 98–111)
Creatinine, Ser: 0.74 mg/dL (ref 0.44–1.00)
GFR, Estimated: >60 mL/min (ref >60)
Glucose, Bld: 104 mg/dL — ABNORMAL HIGH (ref 70–99)
Potassium: 3.8 mmol/L (ref 3.5–5.1)
Sodium: 138 mmol/L (ref 135–145)
Total Bilirubin: 0.4 mg/dL (ref 0.0–1.2)
Total Protein: 5.7 g/dL — ABNORMAL LOW (ref 6.5–8.1)

## 2024-03-24 LAB — MAGNESIUM: Magnesium: 2.3 mg/dL (ref 1.7–2.4)

## 2024-03-24 LAB — CBC
HCT: 36.9 % (ref 36.0–46.0)
Hemoglobin: 11.9 g/dL — ABNORMAL LOW (ref 12.0–15.0)
MCH: 31.6 pg (ref 26.0–34.0)
MCHC: 32.2 g/dL (ref 30.0–36.0)
MCV: 97.9 fL (ref 80.0–100.0)
Platelets: 383 K/uL (ref 150–400)
RBC: 3.77 MIL/uL — ABNORMAL LOW (ref 3.87–5.11)
RDW: 13.4 % (ref 11.5–15.5)
WBC: 6.3 K/uL (ref 4.0–10.5)
nRBC: 0 % (ref 0.0–0.2)

## 2024-03-24 MED ORDER — CLONIDINE 0.1 MG/24HR TD PTWK: 0.1000 mg | MEDICATED_PATCH | TRANSDERMAL | 3 refills | Status: AC

## 2024-03-24 MED ORDER — CLONAZEPAM 0.5 MG PO TABS: 0.5000 mg | ORAL_TABLET | Freq: Three times a day (TID) | ORAL | 0 refills | Status: AC

## 2024-03-24 MED ORDER — NICOTINE 21 MG/24HR TD PT24: 21.0000 mg | MEDICATED_PATCH | Freq: Every day | TRANSDERMAL | 1 refills | Status: DC

## 2024-03-24 NOTE — Plan of Care (Signed)
  Problem: Education: Goal: Knowledge of General Education information will improve Description: Including pain rating scale, medication(s)/side effects and non-pharmacologic comfort measures 03/24/2024 1252 by Lynnette Cena CROME, RN Outcome: Adequate for Discharge 03/24/2024 1010 by Lynnette Cena CROME, RN Outcome: Progressing   Problem: Health Behavior/Discharge Planning: Goal: Ability to manage health-related needs will improve 03/24/2024 1252 by Lynnette Cena CROME, RN Outcome: Adequate for Discharge 03/24/2024 1010 by Lynnette Cena CROME, RN Outcome: Progressing   Problem: Clinical Measurements: Goal: Ability to maintain clinical measurements within normal limits will improve Outcome: Adequate for Discharge Goal: Will remain free from infection Outcome: Adequate for Discharge Goal: Diagnostic test results will improve Outcome: Adequate for Discharge Goal: Respiratory complications will improve Outcome: Adequate for Discharge Goal: Cardiovascular complication will be avoided Outcome: Adequate for Discharge   Problem: Activity: Goal: Risk for activity intolerance will decrease Outcome: Adequate for Discharge   Problem: Nutrition: Goal: Adequate nutrition will be maintained Outcome: Adequate for Discharge   Problem: Coping: Goal: Level of anxiety will decrease Outcome: Adequate for Discharge   Problem: Elimination: Goal: Will not experience complications related to bowel motility Outcome: Adequate for Discharge Goal: Will not experience complications related to urinary retention Outcome: Adequate for Discharge   Problem: Pain Managment: Goal: General experience of comfort will improve and/or be controlled Outcome: Adequate for Discharge   Problem: Safety: Goal: Ability to remain free from injury will improve Outcome: Adequate for Discharge   Problem: Skin Integrity: Goal: Risk for impaired skin integrity will decrease Outcome: Adequate for Discharge   Problem: Fluid  Volume: Goal: Hemodynamic stability will improve Outcome: Adequate for Discharge   Problem: Clinical Measurements: Goal: Diagnostic test results will improve Outcome: Adequate for Discharge Goal: Signs and symptoms of infection will decrease Outcome: Adequate for Discharge   Problem: Respiratory: Goal: Ability to maintain adequate ventilation will improve Outcome: Adequate for Discharge   Problem: SLP Dysphagia Goals Goal: Patient will utilize recommended strategies Description: Patient will utilize recommended strategies during swallow to increase swallowing safety with Outcome: Adequate for Discharge Goal: Patient will demonstrate readiness for PO's Description: Patient will demonstrate readiness for PO's and/or instrumental swallow study as evidenced by: Outcome: Adequate for Discharge

## 2024-03-24 NOTE — Plan of Care (Signed)
   Problem: Education: Goal: Knowledge of General Education information will improve Description Including pain rating scale, medication(s)/side effects and non-pharmacologic comfort measures Outcome: Progressing   Problem: Health Behavior/Discharge Planning: Goal: Ability to manage health-related needs will improve Outcome: Progressing

## 2024-03-24 NOTE — Discharge Summary (Signed)
 Physician Discharge Summary   Patient: Sherry [name] MRN: [id number] DOB: 07/12/1971  Admit date:     03/14/2024  Discharge date: 03/24/24  Discharge Physician: Sherry [name]   PCP: Sherry [name], [name]   Recommendations at discharge:  Reassess blood pressure and adjust antihypertensive regimen as needed Continue assisting patient with tobacco cessation Outpatient follow-up with pulmonology service recommended Repeat basic metabolic panel to follow electrolytes and renal function Repeat CBC to follow hemoglobin trend/stability. Repeat magnesium  and phosphorus level at follow-up visit.  Discharge Diagnoses: Principal Problem:   Acute encephalopathy Active Problems:   Essential hypertension   Sepsis (HCC)   COPD GOLD 2   SIRS (systemic inflammatory response syndrome) (HCC)   Hypokalemia   Acute metabolic encephalopathy   Brief Hospital admission narrative: As per H&P written by Dr. Charlton on 03/14/2024  Sherry [name] is a 53 y.o. female with medical history significant for hypertension, hyperlipidemia, depression, anxiety, and COPD who presents with confusion, agitation, cough, and shortness of breath.   Patient is unable to contribute to the history.  Her significant other reported that she had been experiencing cough and shortness of breath for 3 or 4 days but seem to be improving.  She uses THC on occasion but no other illicit substances.  She is not answering questions in the ED but has been thrashing about in the bed and was attempting to strike ED personnel.   ED Course: Upon arrival to the ED, patient is found to be afebrile and saturating low to mid 90s on room air with tachypnea, tachycardia, and stable blood pressure.  Labs are most notable for potassium 3.1, creatinine 1.04, WBC 28,400, and lactic acid 6.1.  Chest x-ray is negative for acute cardiopulmonary disease. Head CT in negative for acute findings.   Blood cultures were collected and the patient was given 2 L of LR, vancomycin ,  cefepime , Haldol , Versed , DuoNeb, and IV potassium.  Assessment and Plan: 1) Acute Encephalopathy --??  Metabolic in the setting of dehydration and electrolyte derangement or Infectious Given fevers and GI symptoms---Concerns for encephalitis/meningitis - -CT head without acute findings -MRI brain without contrast with possible  encephalitides of various etiologies (including infectious and autoimmune). Atypical distribution of PRES is also a possibility-- Serum ammonia WNL  -UDS with benzos and THC - TSH and B12 WNL -PTA patient was on benzos UDS confirm please avoid abrupt discontinuation of benzos --Infectious disease and neurology consult requested Transferred to [facility]  For lumbar puncture and further neurology and ID consults -MRI head without contrast---without septic emboli --No rash, no recent travel ,no insect bites -Lumbar puncture with CSF studies requested --Received cefepime  Vanco and Flagyl  initially -Changed to Rocephin  (meningitic doses) and vancomycin  -ID physician recommended to complete 10 days treatment with Rocephin  and vancomycin  for meningitis. -Sed rate 3, CRP 0.5 -WBC 27.6 >>28.4 >>20.2>>14.3>>7.0 (within normal limits at discharge). -Mentation back to baseline and is stable at discharge; no headaches and no photophobia. -CSF Gram stain negative, CSF culture NGTD, blood cultures NGTD ---- EEG without epileptiform findings   2)SIRS/sepsis with lactic acidosis--POA - Leukocytosis, tachypnea, and tachycardia present on admission  - Abdomen is soft and no-tender, there is no conspicuous pneumonia on CXR, no bacteriuria or pyuria, no meningismus, no apparent cellulitis  -Blood and CSF cultures NGTD SIRs pathophysiology has resolved - Antibiotics provided as mentioned above #1 --Please see ID physician consult note/H&P dated 03/19/2024   3. AKI -- -acute kidney injury  -   creatinine on admission= 1.07,  baseline creatinine = 0.5-0.6   ,  creatinine  is now= 0.6,  -- AKI resolved with IV fluids Renally adjust medications, avoid nephrotoxic agents / dehydration  / hypotension   4.  Recurrent emesis and diarrhea--- abdominal x-ray without obstructive findings or any other acute findings - GI panel has been discontinue and no need for enteric precaution at time of discharge. - Patient reports no further episode of nausea, vomiting, abdominal pain or diarrhea.   5. Hypokalemia/hyponatremia/hypophosphatemia--due to GI losses in the setting of emesis and diarrhea prior to admission as well as poor oral intake - All electrolytes normalized and is stable at discharge - Patient advised to maintain adequate hydration - Repeat basic metabolic panel and magnesium  level at follow-up visit.  6. Depression/Anxiety  -c/n needed clonazepam  -Continue patient follow-up with PCP.   7. Hypertension  - -MRI brain suggest possible atypical distribution of PRES -- Blood pressure stabilized and control with the use of topical clonidine  - Continue outpatient follow-up of patient's blood pressure with further adjustment to antihypertensive treatment as needed. -Patient advised to maintain adequate hydration and to follow heart healthy/low-sodium diet.   8)COPD - Not in exacerbation  - Continue short-acting bronchodilators as-needed    9) chronic anemia--Hgb down to 10.9, Hgb was high on admission due to hemoconcentration - Hgb is  down now due to hemodilution from IV fluids - No obvious bleeding - Prior history of anemia noted.   10) lumbar back pain - Continue as needed Tylenol  and tramadol  for severe pain - Follow-up as needed Robaxin  has also been started - Patient instructed to continue repositioning.  Consultants: Infectious disease Procedures performed: See below for x-ray report. Disposition: Home Diet recommendation: Heart healthy/low-sodium diet.  DISCHARGE MEDICATION: Allergies as of 03/24/2024       Reactions   Dramamine  [dimenhydrinate] Swelling, Other (See Comments)   Made her feel really drunk. Swelling of tongue    Prednisone  Other (See Comments)   Feels like fingers are swelling        Medication List     STOP taking these medications    aspirin EC 81 MG tablet   ibuprofen  200 MG tablet Commonly known as: ADVIL    oxymetazoline 0.05 % nasal spray Commonly known as: AFRIN       TAKE these medications    acetaminophen  500 MG tablet Commonly known as: TYLENOL  Take 1,000 mg by mouth every 6 (six) hours as needed (pain.).   albuterol  108 (90 Base) MCG/ACT inhaler Commonly known as: VENTOLIN  HFA Inhale 1-2 puffs into the lungs every 4 (four) hours as needed for shortness of breath or wheezing.   clonazePAM  0.5 MG tablet Commonly known as: KLONOPIN  Take 1 tablet (0.5 mg total) by mouth in the morning, at noon, and at bedtime.   cloNIDine  0.1 mg/24hr patch Commonly known as: CATAPRES  - Dosed in mg/24 hr Place 1 patch (0.1 mg total) onto the skin once a week. Start taking on: March 29, 2024   FLUoxetine 10 MG capsule Commonly known as: PROZAC Take 10 mg by mouth in the morning.   fluticasone 50 MCG/ACT nasal spray Commonly known as: FLONASE Place 2 sprays into both nostrils daily as needed for allergies or rhinitis.   Klor-Con  M20 20 MEQ tablet Generic drug: potassium chloride  SA Take 20 mEq by mouth daily.   metoprolol  succinate 50 MG 24 hr tablet Commonly known as: TOPROL -XL Take 25 mg by mouth 2 (two) times daily.   nicotine  21 mg/24hr patch Commonly known  as: NICODERM CQ  - dosed in mg/24 hours Place 1 patch (21 mg total) onto the skin daily. Start taking on: March 25, 2024   pantoprazole  40 MG tablet Commonly known as: PROTONIX  Take 40 mg by mouth daily before breakfast.   QUEtiapine  400 MG tablet Commonly known as: SEROquel  Take 1 tablet (400 mg total) by mouth at bedtime. What changed: how much to take   rosuvastatin 40 MG tablet Commonly known as:  CRESTOR Take 40 mg by mouth daily.   Stiolto Respimat  2.5-2.5 MCG/ACT Aers Generic drug: Tiotropium Bromide-Olodaterol INHALE 2 PUFFS BY MOUTH ONCE DAILY What changed: See the new instructions.        Follow-up Information     Sherry [name], [name]. Schedule an appointment as soon as possible for a visit in 10 day(s).   Specialty: Internal Medicine Contact information: [address] [place] [address] [phone]                Discharge Exam: Sherry [name]   03/14/24 0227 03/15/24 2223 03/18/24 0500  Weight: 51 kg 61.2 kg 60.6 kg   General exam: Alert, awake, oriented x 3; feeling well and in no acute distress. Respiratory system: Clear to auscultation. Respiratory effort normal.  Good saturation on room air. Cardiovascular system:RRR. No murmurs, rubs, gallops. Gastrointestinal system: Abdomen is nondistended, soft and nontender. No organomegaly or masses felt. Normal bowel sounds heard. Central nervous system: Alert and oriented. No focal neurological deficits. Extremities: No cyanosis or clubbing. Skin: No rashes, lesions or ulcers Psychiatry: Judgement and insight appear normal. Mood & affect appropriate.     Condition at discharge: Stable and improved.  The results of significant diagnostics from this hospitalization (including imaging, microbiology, ancillary and laboratory) are listed below for reference.   Imaging Studies: US  EKG SITE RITE Result Date: 03/18/2024 If Site Rite image not attached, placement could not be confirmed due to current cardiac rhythm.  ECHOCARDIOGRAM COMPLETE Result Date: 03/17/2024    ECHOCARDIOGRAM REPORT   Patient Name:   Sherry [name] Date of Exam: 03/17/2024 Medical Rec #:  [id number]  Height:       65.0 in Accession #:    [id number] Weight:       134.9 lb Date of Birth:  10/10/1970  BSA:          1.673 m Patient Age:    53 years   BP:           142/76 mmHg Patient Gender: F          HR:           60 bpm. Exam Location:  Sherry [name] Procedure: 2D Echo, 3D Echo, Cardiac Doppler, Color Doppler and Strain Analysis            (Both Spectral and Color Flow Doppler were utilized during            procedure). Indications:    Dyspnea  History:        Patient has no prior history of Echocardiogram examinations.                 COPD; Risk Factors:Hypertension.  Sonographer:    Sherry [name] Referring Phys: [id number] COURAGE EMOKPAE  Sonographer Comments: Global longitudinal strain was attempted. IMPRESSIONS  1. Left ventricular ejection fraction, by estimation, is 60 to 65%. Left ventricular ejection fraction by 3D volume is 62 %. The left ventricle has normal function. The left ventricle has no regional wall motion abnormalities. Left ventricular diastolic  parameters are consistent with Grade I diastolic dysfunction (impaired relaxation). The average left ventricular global longitudinal strain is -19.5 %. The global longitudinal strain is normal.  2. Right ventricular systolic function is normal. The right ventricular size is normal. There is normal pulmonary artery systolic pressure.  3. The mitral valve is normal in structure. Trivial mitral valve regurgitation. No evidence of mitral stenosis.  4. The aortic valve is tricuspid. Aortic valve regurgitation is not visualized. No aortic stenosis is present.  5. The inferior vena cava is dilated in size with >50% respiratory variability, suggesting right atrial pressure of 8 mmHg. Comparison(s): No prior Echocardiogram. FINDINGS  Left Ventricle: Left ventricular ejection fraction, by estimation, is 60 to 65%. Left ventricular ejection fraction by 3D volume is 62 %. The left ventricle has normal function. The left ventricle has no regional wall motion abnormalities. The average left ventricular global longitudinal strain is -19.5 %. Strain was performed and the global longitudinal strain is normal. The left ventricular internal cavity size was normal in size. There is no left ventricular hypertrophy. Left  ventricular diastolic parameters are consistent with Grade I diastolic dysfunction (impaired relaxation). Normal left ventricular filling pressure. Right Ventricle: The right ventricular size is normal. No increase in right ventricular wall thickness. Right ventricular systolic function is normal. There is normal pulmonary artery systolic pressure. The tricuspid regurgitant velocity is 1.46 m/s, and  with an assumed right atrial pressure of 8 mmHg, the estimated right ventricular systolic pressure is 16.5 mmHg. Left Atrium: Left atrial size was normal in size. Right Atrium: Right atrial size was normal in size. Pericardium: There is no evidence of pericardial effusion. Mitral Valve: The mitral valve is normal in structure. Trivial mitral valve regurgitation. No evidence of mitral valve stenosis. Tricuspid Valve: The tricuspid valve is normal in structure. Tricuspid valve regurgitation is trivial. No evidence of tricuspid stenosis. Aortic Valve: The aortic valve is tricuspid. Aortic valve regurgitation is not visualized. No aortic stenosis is present. Aortic valve mean gradient measures 4.0 mmHg. Aortic valve peak gradient measures 8.5 mmHg. Aortic valve area, by VTI measures 2.39 cm. Pulmonic Valve: The pulmonic valve was not well visualized. Pulmonic valve regurgitation is not visualized. No evidence of pulmonic stenosis. Aorta: The aortic root and ascending aorta are structurally normal, with no evidence of dilitation. Venous: The inferior vena cava is dilated in size with greater than 50% respiratory variability, suggesting right atrial pressure of 8 mmHg. IAS/Shunts: No atrial level shunt detected by color flow Doppler. Additional Comments: 3D was performed not requiring image post processing on an independent workstation and was normal.  LEFT VENTRICLE PLAX 2D LVIDd:         4.60 cm         Diastology LVIDs:         2.80 cm         LV e' medial:    10.10 cm/s LV PW:         0.70 cm         LV E/e' medial:  9.3  LV IVS:        0.70 cm         LV e' lateral:   10.90 cm/s LVOT diam:     2.00 cm         LV E/e' lateral: 8.7 LV SV:         76 LV SV Index:   45              2D  Longitudinal LVOT Area:     3.14 cm        Strain                                2D Strain GLS   -19.5 %                                Avg:                                 3D Volume EF                                LV 3D EF:    Left                                             ventricul                                             ar                                             ejection                                             fraction                                             by 3D                                             volume is                                             62 %.                                 3D Volume EF:                                3D EF:        62 %                                LV EDV:       111 ml  LV ESV:       42 ml                                LV SV:        68 ml RIGHT VENTRICLE             IVC RV S prime:     13.40 cm/s  IVC diam: 2.10 cm TAPSE (M-mode): 2.4 cm LEFT ATRIUM             Index        RIGHT ATRIUM           Index LA diam:        2.80 cm 1.67 cm/m   RA Area:     12.40 cm LA Vol (A2C):   47.2 ml 28.21 ml/m  RA Volume:   28.20 ml  16.85 ml/m LA Vol (A4C):   36.6 ml 21.87 ml/m LA Biplane Vol: 41.4 ml 24.74 ml/m  AORTIC VALVE AV Area (Vmax):    2.54 cm AV Area (Vmean):   2.41 cm AV Area (VTI):     2.39 cm AV Vmax:           146.00 cm/s AV Vmean:          95.100 cm/s AV VTI:            0.317 m AV Peak Grad:      8.5 mmHg AV Mean Grad:      4.0 mmHg LVOT Vmax:         118.00 cm/s LVOT Vmean:        73.100 cm/s LVOT VTI:          0.241 m LVOT/AV VTI ratio: 0.76  AORTA Ao Root diam: 3.00 cm Ao Asc diam:  3.10 cm MITRAL VALVE               TRICUSPID VALVE MV Area (PHT): 4.21 cm    TR Peak grad:   8.5 mmHg MV Decel Time: 180 msec    TR Vmax:        146.00 cm/s MV E  velocity: 94.40 cm/s MV A velocity: 98.50 cm/s  SHUNTS MV E/A ratio:  0.96        Systemic VTI:  0.24 m                            Systemic Diam: 2.00 cm Sherry [name] Electronically signed by Sherry [name] Arleta Maywood Signature Date/Time: 03/17/2024/11:40:42 AM    Final    EEG adult Result Date: 03/16/2024 Sherry Arlin KIDD, [name]     03/16/2024  4:47 PM Patient Name: Sherry [name] MRN: [id number] Epilepsy Attending: Arlin [name] Sherry Referring Physician/Provider: Pearlean Manus, [name] Date: 03/16/2024 Duration: 23.49 mins Patient history: 54yo F with ams. EEG to evaluate for seizure Level of alertness: Awake AEDs during EEG study: None Technical aspects: This EEG study was done with scalp electrodes positioned according to the 10-20 International system of electrode placement. Electrical activity was reviewed with band pass filter of 1-70Hz , sensitivity of 7 uV/mm, display speed of 22mm/sec with a 60Hz  notched filter applied as appropriate. EEG data were recorded continuously and digitally stored.  Video monitoring was available and reviewed as appropriate. Description: The posterior dominant rhythm consists of 8 Hz activity of moderate voltage (25-35 uV) seen predominantly in posterior head regions, symmetric and reactive to eye  opening and eye closing. Hyperventilation and photic stimulation were not performed.   IMPRESSION: This study is within normal limits. No seizures or epileptiform discharges were seen throughout the recording. A normal interictal EEG does not exclude the diagnosis of epilepsy. Sherry [name]   DG FL GUIDED LUMBAR PUNCTURE Result Date: 03/16/2024 CLINICAL DATA:  Patient with altered mental status of unknown etiology. Request for lumbar puncture for further evaluation. EXAM: LUMBAR PUNCTURE UNDER FLUOROSCOPY PROCEDURE: An appropriate skin entry site was determined fluoroscopically. Operator donned sterile gloves and mask. Skin site was marked, then prepped with Betadine, draped in usual  sterile fashion, and infiltrated locally with 1% lidocaine. A 20 gauge spinal needle advanced into the thecal sac at L4-L5. Clear colorless CSF spontaneously returned, with opening pressure of 21 cm water. Twenty ml CSF were collected and divided among 4 sterile vials for the requested laboratory studies. 12 cm water The needle was then removed. The patient tolerated the procedure well and there were no complications. FLUOROSCOPY: Radiation Exposure Index (as provided by the fluoroscopic device): 0.8 mGy Kerma IMPRESSION: Technically successful lumbar puncture under fluoroscopy. This exam was performed by Sherry Hesselbach, PA-C, and was supervised and interpreted by Sherry Sides, [name]. Electronically Signed   By: Sherry [name] M.D.   On: 03/16/2024 14:37   MR BRAIN W CONTRAST Result Date: 03/15/2024 EXAM: MRI BRAIN WITH CONTRAST 03/15/2024 01:40:46 PM TECHNIQUE: Multiplanar multisequence MRI of the head/brain was performed with the administration of intravenous contrast. COMPARISON: 03/14/2024 CLINICAL HISTORY: Vasculitis suspected, CNS. Abn mri brain FINDINGS: BRAIN AND VENTRICLES: No acute infarct. No acute intracranial hemorrhage. No mass effect or midline shift. No hydrocephalus. The sella is unremarkable. Normal flow voids. No mass. A few scattered areas of faint contrast enhancement including the left cerebellum, superior left frontal lobe, and superior right frontal lobe. 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 No mass-like enhancement. ORBITS: No acute abnormality. SINUSES: No acute abnormality. BONES AND SOFT TISSUES: Normal bone marrow signal and enhancement. No acute soft tissue abnormality. IMPRESSION: 1. 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 Electronically signed by: Sherry Stanford [name] 03/15/2024 08:41 PM EDT RP Workstation: [id number]   MR ANGIO HEAD WO CONTRAST Result Date: 03/15/2024 EXAM: MR Angiography Head without intravenous Contrast. 03/15/2024 01:40:46 PM TECHNIQUE: Magnetic resonance angiography images of the head without intravenous contrast. Multiplanar 2D and 3D reformatted images are provided for review. COMPARISON: None provided. CLINICAL HISTORY: Septic arterial embolism. Abn mri brain FINDINGS: ANTERIOR CIRCULATION: The intracranial internal carotid arteries are patent bilaterally. There is mild irregularity of the posterior aspect of the right cavernous ICA which may be related to atherosclerosis. The right posterior communicating artery is visualized. The right M1 segment is patent. There is severe stenosis of a proximal M2 branch of the right MCA seen on series 5 image 132. The left M1 segment is patent. There is additional severe stenosis versus short segment occlusion of a proximal M2 superdivision branch of the left MCA seen on series 5 image 135. The anterior cerebral arteries are patent bilaterally. POSTERIOR CIRCULATION: The intracranial vertebral arteries are patent bilaterally. The basilar artery is patent. The right PCA is patent and appears primarily supplied via the anterior circulation. The left PCA is patent. The superior cerebellar arteries are patent bilaterally. PICA patent bilaterally. IMPRESSION: 1. 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 2. Severe stenosis versus short segment occlusion of a proximal M2 superdivision branch of the left MCA. 3. These foci of stenosis are relatively symmetric and occur on nearly the same axial slices suggesting that the findings may be related to artifact. There are no additional high-grade stenoses of the intracranial arteries notes. Consider CTA for further evaluation. Electronically signed by: Sherry Mania [name] 03/15/2024 03:07 PM EDT RP Workstation:  [id number]   MR BRAIN WO CONTRAST Result Date: 03/15/2024 EXAM: MRI BRAIN WITHOUT CONTRAST 03/14/2024 06:01:54 PM TECHNIQUE: Multiplanar multisequence MRI of the head/brain was performed without the administration of intravenous contrast. COMPARISON: None available. CLINICAL HISTORY: Mental status change, unknown cause. FINDINGS: BRAIN AND VENTRICLES: No acute infarct. No intracranial hemorrhage. No mass. No midline shift. No hydrocephalus. The sella is unremarkable. Normal flow voids. There is multifocal hyperintense T2-weighted cortical signal within the bilateral superior cerebral hemispheres. ORBITS: No acute abnormality. SINUSES AND MASTOIDS: Left mastoid effusion. BONES AND SOFT TISSUES: Normal marrow signal. No acute soft tissue abnormality. IMPRESSION: 1. Multifocal hyperintense T2-weighted cortical signal within the bilateral superior cerebral hemispheres. This is a nonspecific finding that may be associated with encephalitides of various etiologies (including infectious and autoimmune). Atypical distribution of PRES is also a possibility. 2. Post-contrast imaging might be helpful. CSF sampling should be considered. Electronically signed by: Sherry Stanford [name] 03/15/2024 03:53 AM EDT RP Workstation: [id number]   DG Abd 2 Views Result Date: 03/14/2024 CLINICAL DATA:  Vomiting. EXAM: ABDOMEN - 2 VIEW COMPARISON:  None Available. FINDINGS: No bowel dilatation or evidence of obstruction. Air is noted in the colon. No free air or opaque calculus. No acute osseous pathology. IMPRESSION: No evidence of bowel obstruction. Electronically Signed   By: Sherry Chou M.D.   On: 03/14/2024 19:53   CT HEAD WO CONTRAST ( ) Result Date: 03/14/2024 EXAM: CT HEAD WITHOUT CONTRAST 03/14/2024 06:30:56 AM TECHNIQUE: CT of the head was performed without the administration of intravenous contrast. Automated exposure control, iterative reconstruction, and/or weight based adjustment of the mA/kV was utilized to reduce the  radiation dose to as low as reasonably achievable. COMPARISON: CT of the head dated 11/19/2022. CLINICAL HISTORY: Headache, new onset (Age >= 51y). Ems called out for unresponsive pt. Per ems pt has been combative and boyfriend states she is not normally like this. FINDINGS: BRAIN AND VENTRICLES: No acute hemorrhage. No evidence of acute infarct. No hydrocephalus. No extra-axial collection. No mass effect or midline shift. ORBITS: No acute abnormality. SINUSES: No acute abnormality. SOFT TISSUES AND SKULL: No acute soft tissue abnormality. No skull fracture. IMPRESSION: 1. No acute intracranial abnormality. Electronically signed by: Sherry [name] 03/14/2024 06:35 AM EDT RP Workstation: [id number]   DG Chest Portable 1 View Result Date: 03/14/2024 EXAM: 1 VIEW XRAY OF THE CHEST 03/14/2024 04:11:00 AM COMPARISON: AP radiograph of the chest dated 02/04/2023. CLINICAL HISTORY: Sob. Altered mental status, restlessness FINDINGS: LUNGS AND PLEURA: Coarse interstitial opacities again demonstrated diffusely, which appear chronic. No focal pulmonary opacity. No pulmonary edema. No pleural effusion. No pneumothorax. HEART AND MEDIASTINUM: No acute abnormality of the cardiac and mediastinal silhouettes. BONES AND SOFT TISSUES: No acute osseous abnormality. IMPRESSION: 1. No acute findings. 2. Coarse interstitial opacities diffusely, appearing chronic. Electronically signed by: Sherry [name] 03/14/2024 04:44 AM EDT RP Workstation: [id number]    Microbiology: Results for orders placed or performed during the hospital encounter of 03/14/24  Blood culture (routine x 2)     Status: None  Collection Time: 03/14/24  2:50 AM   Specimen: BLOOD  Result Value Ref Range Status   Specimen Description BLOOD RIGHT ANTECUBITAL  Final   Special Requests   Final    BOTTLES DRAWN AEROBIC AND ANAEROBIC Blood Culture results may not be optimal due to an inadequate volume of blood received in culture bottles   Culture    Final    NO GROWTH 5 DAYS Performed at [facility], [address], [place], [place] [address]    Report Status 03/19/2024 FINAL  Final  Resp panel by RT-PCR (RSV, Flu A&B, Covid) Anterior Nasal Swab     Status: None   Collection Time: 03/14/24  4:07 AM   Specimen: Anterior Nasal Swab  Result Value Ref Range Status   SARS Coronavirus 2 by RT PCR NEGATIVE NEGATIVE Final    Comment: (NOTE) SARS-CoV-2 target nucleic acids are NOT DETECTED.  The SARS-CoV-2 RNA is generally detectable in upper respiratory specimens during the acute phase of infection. The lowest concentration of SARS-CoV-2 viral copies this assay can detect is 138 copies/mL. A negative result does not preclude SARS-Cov-2 infection and should not be used as the sole basis for treatment or other patient management decisions. A negative result may occur with  improper specimen collection/handling, submission of specimen other than nasopharyngeal swab, presence of viral mutation(s) within the areas targeted by this assay, and inadequate number of viral copies(<138 copies/mL). A negative result must be combined with clinical observations, patient history, and epidemiological information. The expected result is Negative.  Fact Sheet for Patients:  BloggerCourse.com  Fact Sheet for Healthcare Providers:  SeriousBroker.it  This test is no t yet approved or cleared by the [place]  FDA and  has been authorized for detection and/or diagnosis of SARS-CoV-2 by FDA under an Emergency Use Authorization (EUA). This EUA will remain  in effect (meaning this test can be used) for the duration of the COVID-19 declaration under Section 564(b)(1) of the Act, 21 U.S.C.section 360bbb-3(b)(1), unless the authorization is terminated  or revoked sooner.       Influenza A by PCR NEGATIVE NEGATIVE Final   Influenza B by PCR NEGATIVE NEGATIVE Final    Comment: (NOTE) The Xpert Xpress  SARS-CoV-2/FLU/RSV plus assay is intended as an aid in the diagnosis of influenza from Nasopharyngeal swab specimens and should not be used as a sole basis for treatment. Nasal washings and aspirates are unacceptable for Xpert Xpress SARS-CoV-2/FLU/RSV testing.  Fact Sheet for Patients: BloggerCourse.com  Fact Sheet for Healthcare Providers: SeriousBroker.it  This test is not yet approved or cleared by the [place]  FDA and has been authorized for detection and/or diagnosis of SARS-CoV-2 by FDA under an Emergency Use Authorization (EUA). This EUA will remain in effect (meaning this test can be used) for the duration of the COVID-19 declaration under Section 564(b)(1) of the Act, 21 U.S.C. section 360bbb-3(b)(1), unless the authorization is terminated or revoked.     Resp Syncytial Virus by PCR NEGATIVE NEGATIVE Final    Comment: (NOTE) Fact Sheet for Patients: BloggerCourse.com  Fact Sheet for Healthcare Providers: SeriousBroker.it  This test is not yet approved or cleared by the [place]  FDA and has been authorized for detection and/or diagnosis of SARS-CoV-2 by FDA under an Emergency Use Authorization (EUA). This EUA will remain in effect (meaning this test can be used) for the duration of the COVID-19 declaration under Section 564(b)(1) of the Act, 21 U.S.C. section 360bbb-3(b)(1), unless the authorization is terminated or revoked.  Performed at  [facility], [address], [place], [place] [address]   Blood culture (routine x 2)     Status: None   Collection Time: 03/14/24  5:20 AM   Specimen: BLOOD  Result Value Ref Range Status   Specimen Description BLOOD BLOOD LEFT WRIST  Final   Special Requests   Final    BOTTLES DRAWN AEROBIC AND ANAEROBIC Blood Culture adequate volume   Culture   Final    NO GROWTH 5 DAYS Performed at [facility], [address]  [place], [place] [address]    Report Status 03/19/2024 FINAL  Final  MRSA Next Gen by PCR, Nasal     Status: None   Collection Time: 03/15/24 10:27 PM   Specimen: Nasal Mucosa; Nasal Swab  Result Value Ref Range Status   MRSA by PCR Next Gen NOT DETECTED NOT DETECTED Final    Comment: (NOTE) The GeneXpert MRSA Assay (FDA approved for NASAL specimens only), is one component of a comprehensive MRSA colonization surveillance program. It is not intended to diagnose MRSA infection nor to guide or monitor treatment for MRSA infections. Test performance is not FDA approved in patients less than 66 years old. Performed at [facility], [address], [place], [place] [address]   CSF culture w Gram Stain     Status: None   Collection Time: 03/16/24 12:03 PM   Specimen: Lumbar Puncture; Cerebrospinal Fluid  Result Value Ref Range Status   Specimen Description   Final    LUMBAR Performed at [facility], [address] [place], [place] [address]    Special Requests   Final    Normal Performed at [facility], [address], [place], [place] [address]    Gram Stain   Final    NO ORGANISMS SEEN NO WBC SEEN CYTOSPIN SMEAR CSF Gram Stain Report Called to,Read Back By and Verified With: COOK @ 1421 ON [id number] BY HENDERSON L Performed at [facility], [address] [place], [place] [address]    Culture   Final    NO GROWTH 3 DAYS Performed at [facility], [address], [place], [place] [address]    Report Status 03/19/2024 FINAL  Final  Fungus Culture Without Stain     Status: None (Preliminary result)   Collection Time: 03/16/24 12:03 PM   Specimen: CSF; Other  Result Value Ref Range Status   Specimen Description   Final    CSF Performed at [facility], [address], [place], [place] [address]    Special Requests   Final    NONE Performed at [facility], [address], [place], [place] [address]    Culture   Final    NO FUNGUS ISOLATED AFTER 7 DAYS Performed at [facility], [address] [place], [place] [address]    Report Status PENDING  Incomplete    Labs: CBC: Recent Labs  Lab 03/18/24 0344 03/24/24 0431  WBC 7.0 6.3  HGB 10.9* 11.9*  HCT 34.5* 36.9  MCV 99.7 97.9  PLT 237 383   Basic Metabolic Panel: Recent Labs  Lab 03/18/24 0344 03/19/24 0434 03/23/24 0542 03/24/24 0431  NA 141 139  --  138  K 3.1* 3.6  --  3.8  CL 109 110  --  103  CO2 24 24  --  27  GLUCOSE 106* 92  --  104*  BUN 7 5*  --  12  CREATININE 0.63 0.66 0.68 0.74  CALCIUM 8.1* 8.0*  --  8.6*  MG  --  2.0  --  2.3  PHOS 3.2  --   --   --    Liver Function Tests: Recent Labs  Lab 03/18/24 0344 03/24/24 0431  AST  --  14*  ALT  --  26  ALKPHOS  --  80  BILITOT  --  0.4  PROT  --  5.7*  ALBUMIN 2.8* 3.0*   CBG: No results for input(s): GLUCAP in the last 168 hours.  Discharge time spent:  35 minutes.  Signed: Eric [name], [name] 03/24/2024

## 2024-03-24 NOTE — Plan of Care (Signed)
  Problem: Education: Goal: Knowledge of General Education information will improve Description: Including pain rating scale, medication(s)/side effects and non-pharmacologic comfort measures Outcome: Progressing   Problem: Health Behavior/Discharge Planning: Goal: Ability to manage health-related needs will improve Outcome: Progressing   Problem: Clinical Measurements: Goal: Ability to maintain clinical measurements within normal limits will improve Outcome: Progressing Goal: Will remain free from infection Outcome: Progressing Goal: Diagnostic test results will improve Outcome: Progressing Goal: Respiratory complications will improve Outcome: Progressing Goal: Cardiovascular complication will be avoided Outcome: Progressing   Problem: Activity: Goal: Risk for activity intolerance will decrease Outcome: Progressing   Problem: Nutrition: Goal: Adequate nutrition will be maintained Outcome: Progressing   Problem: Coping: Goal: Level of anxiety will decrease Outcome: Progressing   Problem: Elimination: Goal: Will not experience complications related to bowel motility Outcome: Progressing Goal: Will not experience complications related to urinary retention Outcome: Progressing   Problem: Elimination: Goal: Will not experience complications related to bowel motility Outcome: Progressing Goal: Will not experience complications related to urinary retention Outcome: Progressing   Problem: Pain Managment: Goal: General experience of comfort will improve and/or be controlled Outcome: Progressing   Problem: Safety: Goal: Ability to remain free from injury will improve Outcome: Progressing   Problem: Skin Integrity: Goal: Risk for impaired skin integrity will decrease Outcome: Progressing   Problem: Fluid Volume: Goal: Hemodynamic stability will improve Outcome: Progressing   Problem: Clinical Measurements: Goal: Diagnostic test results will improve Outcome:  Progressing Goal: Signs and symptoms of infection will decrease Outcome: Progressing   Problem: Respiratory: Goal: Ability to maintain adequate ventilation will improve Outcome: Progressing

## 2024-03-24 NOTE — Progress Notes (Signed)
 Discharge instructions, RX's and follow up appt explained and provided to patient verbalized understanding. Patient left floor walking accompanied by Theme park manager.   Brion Hedges, Cena Helling, RN

## 2024-03-28 ENCOUNTER — Ambulatory Visit (INDEPENDENT_AMBULATORY_CARE_PROVIDER_SITE_OTHER): Admitting: Internal Medicine

## 2024-03-28 ENCOUNTER — Encounter: Payer: Self-pay | Admitting: Internal Medicine

## 2024-03-28 VITALS — BP 107/69 | HR 90 | Ht 65.0 in | Wt 131.8 lb

## 2024-03-28 DIAGNOSIS — F17210 Nicotine dependence, cigarettes, uncomplicated: Secondary | ICD-10-CM | POA: Diagnosis not present

## 2024-03-28 DIAGNOSIS — J449 Chronic obstructive pulmonary disease, unspecified: Secondary | ICD-10-CM

## 2024-03-28 MED ORDER — ALBUTEROL SULFATE HFA 108 (90 BASE) MCG/ACT IN AERS: 1.0000 | INHALATION_SPRAY | RESPIRATORY_TRACT | 11 refills | Status: AC | PRN

## 2024-03-28 NOTE — Progress Notes (Signed)
 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   S/p ptx age 53     History of Present Illness  09/21/2022  Pulmonary/ 1st office eval/ Adream Parzych / Sales executive Complaint  Patient presents with   Consult    COPD./ Asthma/ Lung nodules/ Unexplained weight loss X 1 year   Dyspnea:  walking all day at work 12 h, 18 steps to get to appt so far can do s stopping  Cough: variable, not worse 1st thing in am but worse w/in 30 min and disturbs sleep >  min productive/ mucoid  Sleep: on L side/ flat bed  SABA use: last used around 7 h prior to OV   02: none  Lung cancer screen: in program  Rec Plan A = Automatic = Always=    Stiolto 2 puffs each am  Work on inhaler technique:  Plan B = Backup (to supplement plan A, not to replace it) Only use your albuterol  inhaler as a rescue medication  For cough > mucinex  dm 1200 mg twice daily as needed  My office will be contacting you by phone for referral to orthopedic surgery   Pantoprazole  (protonix ) 40 mg   Take  30-60 min before first meal of the day and Pepcid  (famotidine )  20 mg after supper until return to office -  Please remember to go to the lab department   EOS  0  alpha one AT phenotype  MM  level 206       Please schedule a follow up office visit in 6 weeks, call sooner if needed - bring inhalers    11/02/2022  f/u ov/[place] office/Leshonda Galambos re: GOLD? copd maint on stiolto  and did not  bring inhalers as requested  Chief Complaint  Patient presents with   Follow-up    Pt f/u states that her breathing is better, no questions or concerns.   Dyspnea:  much less active due to  L hip pain  Cough: better despite still smoking  Sleeping: bed is flat 2 pillows/ min am sputum  SABA use: maybe once a day p  exertion .  02: none  Rec No change in medications  - Bring Inhalers with you to next visit  Please schedule a follow up visit in 3 months but call sooner if needed PFT on return    03/28/2024  f/u ov/[place] office/Jamisen Hawes re: GOLD 2 COPD maint on prn saba still smoking    Chief Complaint  Patient presents with   COPD    Hsp f/u - congestion - doe  Dyspnea:  L hip limiting but struggles doe up 18 steps   Cough: minimal dry  Sleeping: bed is flat L side down no    resp cc  SABA use:1- 2x hfa per xay 02: not   Lung cancer screening: 03/28/2024 referred   No obvious day to day or daytime variability or assoc excess/ purulent sputum or mucus plugs or hemoptysis or cp or chest tightness, subjective wheeze or overt sinus or hb symptoms.    Also denies any obvious fluctuation of symptoms with weather or environmental changes or other aggravating or alleviating factors except as outlined above   No unusual exposure hx or  h/o childhood pna/ asthma or knowledge of premature birth.  Current Allergies, Complete Past Medical History, Past Surgical History, Family History, and Social History were reviewed in [employer] record.  ROS  The following are not active complaints unless bolded Hoarseness, sore throat, dysphagia, dental problems, itching, sneezing,  nasal congestion or discharge of excess mucus or purulent secretions, ear ache,   fever, chills, sweats, unintended wt loss or wt gain, classically pleuritic or exertional cp,  orthopnea pnd or arm/hand swelling  or leg swelling, presyncope, palpitations, abdominal pain, anorexia, nausea, vomiting, diarrhea  or change in bowel habits or change in bladder habits, change in stools or change in urine, dysuria, hematuria,  rash, arthralgias, visual complaints, headache, numbness, weakness or ataxia or problems with walking or coordination,  change in mood or  memory.        Current Meds  Medication Sig   acetaminophen   (TYLENOL ) 500 MG tablet Take 1,000 mg by mouth every 6 (six) hours as needed (pain.).   clonazePAM  (KLONOPIN ) 0.5 MG tablet Take 1 tablet (0.5 mg total) by mouth in the morning, at noon, and at bedtime.   [START ON 03/29/2024] cloNIDine  (CATAPRES  - DOSED IN MG/24 HR) 0.1 mg/24hr patch Place 1 patch (0.1 mg total) onto the skin once a week.   FLUoxetine (PROZAC) 10 MG capsule Take 10 mg by mouth in the morning.   fluticasone (FLONASE) 50 MCG/ACT nasal spray Place 2 sprays into both nostrils daily as needed for allergies or rhinitis.   KLOR-CON  M20 20 MEQ tablet Take 20 mEq by mouth daily.   metoprolol  succinate (TOPROL -XL) 50 MG 24 hr tablet Take 25 mg by mouth 2 (two) times daily.   pantoprazole  (PROTONIX ) 40 MG tablet Take 40 mg by mouth daily before breakfast.   QUEtiapine  (SEROQUEL ) 400 MG tablet Take 1 tablet (400 mg total) by mouth at bedtime. (Patient taking differently: Take 800 mg by mouth at bedtime.)   rosuvastatin (CRESTOR) 40 MG tablet Take 40 mg by mouth daily.   Tiotropium Bromide-Olodaterol (STIOLTO RESPIMAT ) 2.5-2.5 MCG/ACT AERS INHALE 2 PUFFS BY MOUTH ONCE DAILY (Patient taking differently: Inhale 2 puffs into the lungs daily.)   [DISCONTINUED] albuterol  (VENTOLIN  HFA) 108 (90 Base) MCG/ACT inhaler Inhale 1-2 puffs into the lungs every 4 (four) hours as needed for shortness of breath or wheezing.               Objective:    wts  03/28/2024        131 11/02/2022        116    09/21/22 120 lb 6.4 oz (54.6 kg)  07/13/22 128 lb (58.1 kg)  03/17/22 142 lb (64.4 kg)    Vital signs reviewed  03/28/2024  - Note at rest 02 sats  95% on RA   General appearance:    amb wf nad / edentulous    HEENT : Oropharynx  clear   Nasal turbinates nl    NECK :  without  apparent JVD/ palpable Nodes/TM    LUNGS: no acc muscle use,  Min barrel  contour chest wall with bilateral  slightly decreased bs s audible wheeze and  without cough on insp or exp maneuvers and min  Hyperresonant  to   percussion bilaterally    CV:  RRR  no s3 or murmur or increase in P2, and no edema   ABD:  soft and nontender    MS:  Nl gait/ ext warm without deformities Or obvious joint restrictions  calf tenderness, cyanosis - Mild - mod clubbing     SKIN: warm and dry without lesions    NEURO:  alert, approp, nl sensorium with  no motor or cerebellar deficits apparent.          Assessment   Assessment & Plan Cigarette smoker Counseled re importance of smoking cessation but did not meet time criteria for separate billing    Low-dose CT lung cancer screening is recommended for patients who are 48-71 years of age with a 20+ pack-year history of smoking and who are currently smoking or quit <=15 years ago. No coughing up blood  No unintentional weight loss of > 15 pounds in the last 6 months - pt is eligible for scanning yearly until 65 y p quits > referred   COPD GOLD 2 Active smoker/MM  - CT 07/29/22 Mild emphysema  - 09/21/2022   Walked on RA  x  3  lap(s) =  approx 450  ft  @ fast pace, stopped due to end of study mild doe at last lap with lowest 02 sats 92%  - 09/21/2022  After extensive coaching inhaler device,  effectiveness =    80% try stiolto with max gerd rx  -  09/21/22  :  EOS  0  alpha one AT phenotype  MM  level 206  -  PFT's  01/25/23   FEV1 2.0 (69 % ) ratio 0.58  p 0 % improvement from saba p ? stiolto prior to study with DLCO  11.39 (52%)   and FV curve mildly concave   Could not really tell any change with stiolto so just using prn saba   Re SABA :  I spent extra time with pt today reviewing appropriate use of albuterol  for prn use on exertion with the following points: 1) saba is for relief of sob that does not improve by walking a slower pace or resting but rather if the pt does not improve after trying this first. 2) If the pt is convinced, as many are, that saba helps recover from activity faster then it's easy to tell if this is the case by re-challenging : ie stop,  take the inhaler, then p 5 minutes try the exact same activity (intensity of workload) that just caused the symptoms and see if they are substantially diminished or not after saba 3) if there is an activity that reproducibly causes the symptoms, try the saba 15 min before the activity on alternate days   If in fact the saba really does help, then fine to continue to use it prn but advised may need to look closer at the maintenance regimen (for now =0) being used to achieve better control of airways disease with exertion.          Each maintenance medication was reviewed in detail including emphasizing most importantly the difference between maintenance and prns and under what circumstances the prns are to be triggered using an action plan format where appropriate.  Total time for H and P, chart review, counseling, reviewing hfa /neb  device(s) and generating customized AVS unique to this office visit / same day charting = 34 min          AVS  Patient Instructions  My office will be contacting you by phone for referral to lung cancer screening   [phone]) - if you don't hear back from my office within one week,  please call us  back or notify us  thru MyChart and we'll address  it right away.    Use your albuterol  as a rescue medication to be used if you can't catch your breath by resting, slowing your pace,  or doing a relaxed purse lip breathing pattern.  - The less you use it, the better it will work when you need it. - Ok to use up to 2 puffs  every 4 hours if you must but call for  appointment if use goes up over your usual need - Don't leave home without it !!  (think of it like a spare tire or starter fluid for your car)    Also  Ok to try albuterol  15 min before an activity (on alternating days)  that you know would usually make you short of breath and see if it makes any difference and if makes none then don't take albuterol  after activity unless you can't catch your breath as  this means it's the resting that helps, not the albuterol .  Please schedule a follow up visit in 6 months but call sooner if needed           Ozell America, MD 03/28/2024

## 2024-03-28 NOTE — Assessment & Plan Note (Addendum)
 Counseled re importance of smoking cessation but did not meet time criteria for separate billing   Low-dose CT lung cancer screening is recommended for patients who are 71-54 years of age with a 20+ pack-year history of smoking and who are currently smoking or quit <=15 years ago. No coughing up blood  No unintentional weight loss of > 15 pounds in the last 6 months - pt is eligible for scanning yearly until 15 y p quits  > referred

## 2024-03-28 NOTE — Assessment & Plan Note (Addendum)
 Active smoker/MM  - CT 07/29/22 Mild emphysema  - 09/21/2022   Walked on RA  x  3  lap(s) =  approx 450  ft  @ fast pace, stopped due to end of study mild doe at last lap with lowest 02 sats 92%  - 09/21/2022  After extensive coaching inhaler device,  effectiveness =    80% try stiolto with max gerd rx  -  09/21/22  :  EOS  0  alpha one AT phenotype  MM  level 206  -  PFT's  01/25/23   FEV1 2.0 (69 % ) ratio 0.58  p 0 % improvement from saba p ? stiolto prior to study with DLCO  11.39 (52%)   and FV curve mildly concave   Could not really tell any change with stiolto so just using prn saba   Re SABA :  I spent extra time with pt today reviewing appropriate use of albuterol  for prn use on exertion with the following points: 1) saba is for relief of sob that does not improve by walking a slower pace or resting but rather if the pt does not improve after trying this first. 2) If the pt is convinced, as many are, that saba helps recover from activity faster then it's easy to tell if this is the case by re-challenging : ie stop, take the inhaler, then p 5 minutes try the exact same activity (intensity of workload) that just caused the symptoms and see if they are substantially diminished or not after saba 3) if there is an activity that reproducibly causes the symptoms, try the saba 15 min before the activity on alternate days   If in fact the saba really does help, then fine to continue to use it prn but advised may need to look closer at the maintenance regimen (for now =0) being used to achieve better control of airways disease with exertion.          Each maintenance medication was reviewed in detail including emphasizing most importantly the difference between maintenance and prns and under what circumstances the prns are to be triggered using an action plan format where appropriate.  Total time for H and P, chart review, counseling, reviewing hfa /neb  device(s) and generating customized AVS unique  to this office visit / same day charting = 34 min

## 2024-03-28 NOTE — Patient Instructions (Addendum)
 My office will be contacting you by phone for referral to lung cancer screening   [phone]) - if you don't hear back from my office within one week,  please call us  back or notify us  thru MyChart and we'll address it right away.    Use your albuterol  as a rescue medication to be used if you can't catch your breath by resting, slowing your pace,  or doing a relaxed purse lip breathing pattern.  - The less you use it, the better it will work when you need it. - Ok to use up to 2 puffs  every 4 hours if you must but call for  appointment if use goes up over your usual need - Don't leave home without it !!  (think of it like a spare tire or starter fluid for your car)    Also  Ok to try albuterol  15 min before an activity (on alternating days)  that you know would usually make you short of breath and see if it makes any difference and if makes none then don't take albuterol  after activity unless you can't catch your breath as this means it's the resting that helps, not the albuterol .  Please schedule a follow up visit in 6 months but call sooner if needed

## 2024-04-05 DIAGNOSIS — E875 Hyperkalemia: Secondary | ICD-10-CM | POA: Diagnosis not present

## 2024-04-05 DIAGNOSIS — R197 Diarrhea, unspecified: Secondary | ICD-10-CM | POA: Diagnosis not present

## 2024-04-06 DIAGNOSIS — R197 Diarrhea, unspecified: Secondary | ICD-10-CM | POA: Diagnosis not present

## 2024-04-06 LAB — CULTURE, FUNGUS WITHOUT SMEAR

## 2024-04-11 ENCOUNTER — Telehealth: Payer: Self-pay

## 2024-04-11 DIAGNOSIS — Z87891 Personal history of nicotine dependence: Secondary | ICD-10-CM

## 2024-04-11 DIAGNOSIS — F17210 Nicotine dependence, cigarettes, uncomplicated: Secondary | ICD-10-CM

## 2024-04-11 DIAGNOSIS — Z122 Encounter for screening for malignant neoplasm of respiratory organs: Secondary | ICD-10-CM

## 2024-04-11 NOTE — Telephone Encounter (Signed)
 Lung Cancer Screening Narrative/Criteria Questionnaire (Cigarette Smokers Only- No Cigars/Pipes/vapes)   Sherry [name]   SDMV:04/20/2024 at 9:00 am [name]         09-21-1970   LDCT: 04/23/2024 at 8:30 am [facility]    53 y.o.   Phone: [phone]  Lung Screening Narrative (confirm age 39-77 yrs Medicare / 50-80 yrs Private pay insurance)   Insurance information:Medicaid   Referring Provider: Darlean   This screening involves an initial phone call with a team member from our program. It is called a shared decision making visit. The initial meeting is required by  insurance and Medicare to make sure you understand the program. This appointment takes about 15-20 minutes to complete. You will complete the screening scan at your scheduled date/time.  This scan takes about 5-10 minutes to complete. You can eat and drink normally before and after the scan.  Criteria questions for Lung Cancer Screening:   Are you a current or former smoker? Current Age began smoking: 13   If you are a former smoker, what year did you quit smoking? Never Quit (within 15 yrs)   To calculate your smoking history, I need an accurate estimate of how many packs of cigarettes you smoked per day and for how many years. (Not just the number of PPD you are now smoking)   Years smoking 40 x Packs per day 1 = Pack years 40   (at least 20 pack yrs)   (Make sure they understand that we need to know how much they have smoked in the past, not just the number of PPD they are smoking now)  Do you have a personal history of cancer?  No    Do you have a family history of cancer? Yes  (cancer type and and relative) Grandparents had colon cancer. Uncle had colon. 2 other uncles with lung cancer.   Are you coughing up blood?  No  Have you had unexplained weight loss of 15 lbs or more in the last 6 months? No  It looks like you meet all criteria.  When would be a good time for us  to schedule you for this screening?   Additional  information: N/A

## 2024-04-16 ENCOUNTER — Other Ambulatory Visit (HOSPITAL_COMMUNITY): Payer: Self-pay | Admitting: Internal Medicine

## 2024-04-16 DIAGNOSIS — Z1231 Encounter for screening mammogram for malignant neoplasm of breast: Secondary | ICD-10-CM

## 2024-04-18 ENCOUNTER — Encounter (INDEPENDENT_AMBULATORY_CARE_PROVIDER_SITE_OTHER): Payer: Self-pay | Admitting: *Deleted

## 2024-04-20 ENCOUNTER — Ambulatory Visit: Admitting: *Deleted

## 2024-04-20 ENCOUNTER — Encounter: Payer: Self-pay | Admitting: *Deleted

## 2024-04-20 DIAGNOSIS — F17210 Nicotine dependence, cigarettes, uncomplicated: Secondary | ICD-10-CM

## 2024-04-20 NOTE — Patient Instructions (Signed)

## 2024-04-20 NOTE — Progress Notes (Signed)
 Virtual Visit via Telephone Note  I connected with Sherry [name] on 04/20/24 at  9:00 AM EDT by telephone and verified that I am speaking with the correct person using two identifiers.  Location: Patient: at home Provider: [address] [place], [place], [address]    I discussed the limitations, risks, security and privacy concerns of performing an evaluation and management service by telephone and the availability of in person appointments. I also discussed with the patient that there may be a patient responsible charge related to this service. The patient expressed understanding and agreed to proceed.   Shared Decision Making Visit Lung Cancer Screening Program [phone])   Eligibility: Age 53 y.o. Pack Years Smoking History Calculation 40 (# packs/per year x # years smoked) Recent History of coughing up blood  no Unexplained weight loss? no ( >Than 15 pounds within the last 6 months ) Prior History Lung / other cancer no (Diagnosis within the last 5 years already requiring surveillance chest CT Scans). Smoking Status Current Smoker Former Smokers: Years since quit: n/a  Quit Date: n/a  Visit Components: Discussion included one or more decision making aids. yes Discussion included risk/benefits of screening. yes Discussion included potential follow up diagnostic testing for abnormal scans. yes Discussion included meaning and risk of over diagnosis. yes Discussion included meaning and risk of False Positives. yes Discussion included meaning of total radiation exposure. yes  Counseling Included: Importance of adherence to annual lung cancer LDCT screening. yes Impact of comorbidities on ability to participate in the program. yes Ability and willingness to under diagnostic treatment. yes  Smoking Cessation Counseling: Current Smokers:  Discussed importance of smoking cessation. yes Information about tobacco cessation classes and interventions provided to patient.  yes Patient provided with ticket for LDCT Scan. no Symptomatic Patient. no  Diagnosis Code: Tobacco Use Z72.0 Asymptomatic Patient yes   Counseled patient 4 minutes regarding tobacco use.   Former Smokers:  Discussed the importance of maintaining cigarette abstinence. yes Diagnosis Code: Personal History of Nicotine  Dependence. S12.108 Information about tobacco cessation classes and interventions provided to patient. Yes Patient provided with ticket for LDCT Scan. no Written Order for Lung Cancer Screening with LDCT placed in Epic. Yes (CT Chest Lung Cancer Screening Low Dose W/O CM) [id number] Z12.2-Screening of respiratory organs Z87.891-Personal history of nicotine  dependence   Laneta Speaks, RN

## 2024-04-23 ENCOUNTER — Ambulatory Visit (HOSPITAL_COMMUNITY)
Admission: RE | Admit: 2024-04-23 | Discharge: 2024-04-23 | Disposition: A | Discharge status: Home / Self Care | Source: Ambulatory Visit | Attending: Acute Care | Admitting: Acute Care

## 2024-04-23 DIAGNOSIS — I700 Atherosclerosis of aorta: Secondary | ICD-10-CM | POA: Diagnosis not present

## 2024-04-23 DIAGNOSIS — I2510 Atherosclerotic heart disease of native coronary artery without angina pectoris: Secondary | ICD-10-CM | POA: Insufficient documentation

## 2024-04-23 DIAGNOSIS — Z87891 Personal history of nicotine dependence: Secondary | ICD-10-CM | POA: Insufficient documentation

## 2024-04-23 DIAGNOSIS — M4854XA Collapsed vertebra, not elsewhere classified, thoracic region, initial encounter for fracture: Secondary | ICD-10-CM | POA: Diagnosis not present

## 2024-04-23 DIAGNOSIS — X58XXXA Exposure to other specified factors, initial encounter: Secondary | ICD-10-CM | POA: Diagnosis not present

## 2024-04-23 DIAGNOSIS — F17210 Nicotine dependence, cigarettes, uncomplicated: Secondary | ICD-10-CM | POA: Insufficient documentation

## 2024-04-23 DIAGNOSIS — Z122 Encounter for screening for malignant neoplasm of respiratory organs: Secondary | ICD-10-CM | POA: Insufficient documentation

## 2024-04-23 DIAGNOSIS — J439 Emphysema, unspecified: Secondary | ICD-10-CM | POA: Diagnosis not present

## 2024-04-24 DIAGNOSIS — J449 Chronic obstructive pulmonary disease, unspecified: Secondary | ICD-10-CM | POA: Diagnosis not present

## 2024-04-24 DIAGNOSIS — E876 Hypokalemia: Secondary | ICD-10-CM | POA: Diagnosis not present

## 2024-04-24 DIAGNOSIS — R0789 Other chest pain: Secondary | ICD-10-CM | POA: Diagnosis not present

## 2024-04-24 DIAGNOSIS — I10 Essential (primary) hypertension: Secondary | ICD-10-CM | POA: Diagnosis not present

## 2024-04-25 ENCOUNTER — Other Ambulatory Visit: Payer: Self-pay

## 2024-04-25 ENCOUNTER — Telehealth: Payer: Self-pay

## 2024-04-25 DIAGNOSIS — Z87891 Personal history of nicotine dependence: Secondary | ICD-10-CM

## 2024-04-25 DIAGNOSIS — Z122 Encounter for screening for malignant neoplasm of respiratory organs: Secondary | ICD-10-CM

## 2024-04-25 DIAGNOSIS — F17210 Nicotine dependence, cigarettes, uncomplicated: Secondary | ICD-10-CM

## 2024-04-25 NOTE — Telephone Encounter (Signed)
 Called and spoke with patient. Reviewed recent Lung CT results. She will complete an annual Lung CT again next year. Order placed. She is on statin therapy. She has been scheduled with Dr. Geronimo to discuss ILD on 05/31/2024. Pt has seen Dr. Darlean in the past however she requested next available appt with ILD specialist. I have called Dr. Carolee office to follow up with patient on compression fractures. Per patient she has had back pain for 8 months and everyone thinks I'm crazy. I spoke with Kathrine at Dr. San office, she is sending a message to Dr. Carolee nurse Clotilda so that Dr. Katrinka can review the recent Lung CT results. I have also provided the office with our contact information if she has any questions. Patient is following up with Dr. Katrinka tomorrow morning on next steps of care. Results have been sent to PCP.   Isaiah, RN

## 2024-04-25 NOTE — Telephone Encounter (Signed)
 Called patient to review recent Lung CT results. LVM. She is currently followed by Dr. Darlean. Will offer ILD consult. Will also review additional findings when patient returns call.   IMPRESSION: 1. Lung-RADS 1, negative. Continue annual screening with low-dose chest CT without contrast in 12 months. 2.  Age advanced two vessel coronary artery calcification. 3. Suspect mild interstitial lung disease. 4. New T7, T8, T10 and T11 compression fractures, age indeterminate. 5.  Aortic atherosclerosis (ICD10-I70.0). 6.  Emphysema (ICD10-J43.9).

## 2024-04-30 ENCOUNTER — Ambulatory Visit (HOSPITAL_COMMUNITY)
Admission: RE | Admit: 2024-04-30 | Discharge: 2024-04-30 | Disposition: A | Discharge status: Home / Self Care | Source: Ambulatory Visit | Attending: Internal Medicine | Admitting: Internal Medicine

## 2024-04-30 ENCOUNTER — Encounter (INDEPENDENT_AMBULATORY_CARE_PROVIDER_SITE_OTHER): Payer: Self-pay | Admitting: Gastroenterology

## 2024-04-30 ENCOUNTER — Ambulatory Visit (INDEPENDENT_AMBULATORY_CARE_PROVIDER_SITE_OTHER): Admitting: Gastroenterology

## 2024-04-30 VITALS — BP 106/70 | HR 78 | Temp 97.5°F | Ht 65.5 in | Wt 139.8 lb

## 2024-04-30 DIAGNOSIS — K219 Gastro-esophageal reflux disease without esophagitis: Secondary | ICD-10-CM

## 2024-04-30 DIAGNOSIS — Z1231 Encounter for screening mammogram for malignant neoplasm of breast: Secondary | ICD-10-CM | POA: Insufficient documentation

## 2024-04-30 DIAGNOSIS — K5904 Chronic idiopathic constipation: Secondary | ICD-10-CM

## 2024-04-30 DIAGNOSIS — K5909 Other constipation: Secondary | ICD-10-CM | POA: Diagnosis not present

## 2024-04-30 MED ORDER — OMEPRAZOLE 40 MG PO CPDR
40.0000 mg | DELAYED_RELEASE_CAPSULE | Freq: Every day | ORAL | 3 refills | Status: AC
Start: 2024-04-30 — End: ?

## 2024-04-30 MED ORDER — LINACLOTIDE 145 MCG PO CAPS: 145.0000 ug | ORAL_CAPSULE | Freq: Every day | ORAL | 3 refills | Status: AC

## 2024-04-30 NOTE — Progress Notes (Signed)
 Toribio Fortune, M.D. Gastroenterology & Hepatology [facility]enter[facility] [address] [place], [place] [address]  Primary Care Physician: Halbert Mariano SQUIBB, DO [address] [place] [address]  I will communicate my assessment and recommendations to the referring MD via EMR.  Problems: Acute diarrhea, resolved CIC GERD  History of Present Illness: Sherry [name] is a 53 y.o. female with past medical history of COPD, depression, hyperlipidemia, hypertension, who presents for evaluation of diarrhea.  The patient was admitted to [facility] in early September after presenting acute encephalopathy due to possible encephalitis/meningitis.  She was given a 10-day course of Rocephin  and vancomycin .  It appears that prior to her admission she was having emesis and diarrhea which caused significant electrolyte derangements that were corrected.  Most recent labs from 03/24/2024 showed CMP with normal electrolytes and renal function, normal liver function test.  CBC with WBC 6.3, hemoglobin 11.9 and platelets 383. Last TSH 03/14/24 was 3.3.  Patient reports that while in the hospital she developed new onset of diarrhea. She believes this was related to the antibiotics she was on. She reports that the diarrhea lasted for 3 weeks but it eventually subsided. States for the last few days she has had issues with constipation as she moves her bowels once a week. States sometimes she wipes and sees a very scant amount of blood.No melena recently. Has some mild abdominal pain. Has had some nausea.  She states that she was taking Linzess  72 mcg qday in the past but it did not help.  She has had heartburn on a daily basis despite taking Protonix  40 mg qday. Has also taken antacids without too much relief.  The patient denies having any nausea, vomiting, fever, chills, melena, hematemesis, abdominal distention,  jaundice, pruritus or weight loss.  Last EGD:  never Last Colonoscopy: 03/17/22 - One 2 mm polyp in the ascending colon, removed with a cold biopsy forceps. Resected and retrieved. - One 5 mm polyp in the transverse colon, removed with a cold snare. Resected and retrieved. - Two 1 to 2 mm polyps in the sigmoid colon and in the descending colon, removed with a cold biopsy forceps. Resected and retrieved. - Tortuous colon.  Recommended repeat colonoscopy in 5 years.  Past Medical History: Past Medical History:  Diagnosis Date   COPD (chronic obstructive pulmonary disease) (HCC)    Depression    High cholesterol    Hypertension    Lung collapse    left    Past Surgical History: Past Surgical History:  Procedure Laterality Date   CHEST TUBE INSERTION     COLONOSCOPY WITH PROPOFOL  N/A 03/17/2022   Procedure: COLONOSCOPY WITH PROPOFOL ;  Surgeon: [name], MD;  Location: [facility];  Service: Gastroenterology;  Laterality: N/A;  930 ASA 2   ENDOMETRIAL ABLATION     POLYPECTOMY  03/17/2022   Procedure: POLYPECTOMY;  Surgeon: [name], MD;  Location: [facility];  Service: Gastroenterology;;    Family History: Family History  Problem Relation Age of Onset   Colon polyps Mother    Colon cancer Maternal Grandmother    Colon cancer Maternal Grandfather    Colon cancer Paternal Grandmother    Colon cancer Paternal Grandfather    Colon cancer Paternal Uncle     Social History: Social History   Tobacco Use  Smoking Status Every Day   Current packs/day: 1.00   Average packs/day: 1 pack/day for 39.8 years (39.8 ttl pk-yrs)  Types: Cigarettes   Start date: 50   Passive exposure: Current  Smokeless Tobacco Never   Social History   Substance and Sexual Activity  Alcohol Use Not Currently   Social History   Substance and Sexual Activity  Drug Use Never    Allergies: Allergies  Allergen Reactions   Dramamine [Dimenhydrinate] Swelling and Other (See Comments)    Made her feel really  drunk. Swelling of tongue     Prednisone  Other (See Comments)    Feels like fingers are swelling    Medications: Current Outpatient Medications  Medication Sig Dispense Refill   acetaminophen  (TYLENOL ) 500 MG tablet Take 1,000 mg by mouth every 6 (six) hours as needed (pain.).     albuterol  (VENTOLIN  HFA) 108 (90 Base) MCG/ACT inhaler Inhale 1-2 puffs into the lungs every 4 (four) hours as needed for shortness of breath or wheezing. 18 g 11   clonazePAM  (KLONOPIN ) 0.5 MG tablet Take 1 tablet (0.5 mg total) by mouth in the morning, at noon, and at bedtime. 30 tablet 0   cloNIDine  (CATAPRES  - DOSED IN MG/24 HR) 0.1 mg/24hr patch Place 1 patch (0.1 mg total) onto the skin once a week. 12 patch 3   FLUoxetine (PROZAC) 10 MG capsule Take 10 mg by mouth in the morning.     fluticasone (FLONASE) 50 MCG/ACT nasal spray Place 2 sprays into both nostrils daily as needed for allergies or rhinitis.     metoprolol  succinate (TOPROL -XL) 50 MG 24 hr tablet Take 25 mg by mouth 2 (two) times daily.     pantoprazole  (PROTONIX ) 40 MG tablet Take 40 mg by mouth daily before breakfast.     QUEtiapine  (SEROQUEL ) 400 MG tablet Take 1 tablet (400 mg total) by mouth at bedtime. 10 tablet 0   rosuvastatin (CRESTOR) 40 MG tablet Take 40 mg by mouth daily.     No current facility-administered medications for this visit.    Review of Systems: GENERAL: negative for malaise, night sweats HEENT: No changes in hearing or vision, no nose bleeds or other nasal problems. NECK: Negative for lumps, goiter, pain and significant neck swelling RESPIRATORY: Negative for cough, wheezing CARDIOVASCULAR: Negative for chest pain, leg swelling, palpitations, orthopnea GI: SEE HPI MUSCULOSKELETAL: Negative for joint pain or swelling, back pain, and muscle pain. SKIN: Negative for lesions, rash PSYCH: Negative for sleep disturbance, mood disorder and recent psychosocial stressors. HEMATOLOGY Negative for prolonged bleeding,  bruising easily, and swollen nodes. ENDOCRINE: Negative for cold or heat intolerance, polyuria, polydipsia and goiter. NEURO: negative for tremor, gait imbalance, syncope and seizures. The remainder of the review of systems is noncontributory.   Physical Exam: BP 106/70 (BP Location: Left Arm, Patient Position: Sitting, Cuff Size: Normal)   Pulse 78   Temp (!) 97.5 F (36.4 C) (Temporal)   Ht 5' 5.5 (1.664 m)   Wt 139 lb 12.8 oz (63.4 kg)   BMI 22.91 kg/m  GENERAL: The patient is AO x3, in no acute distress. HEENT: Head is normocephalic and atraumatic. EOMI are intact. Mouth is well hydrated and without lesions. NECK: Supple. No masses LUNGS: Clear to auscultation. No presence of rhonchi/wheezing/rales. Adequate chest expansion HEART: RRR, normal s1 and s2. ABDOMEN: Soft, nontender, no guarding, no peritoneal signs, and nondistended. BS +. No masses. EXTREMITIES: Without any cyanosis, clubbing, rash, lesions or edema. NEUROLOGIC: AOx3, no focal motor deficit. SKIN: no jaundice, no rashes  Imaging/Labs: as above  I personally reviewed and interpreted the available labs, imaging and endoscopic files.  Impression and Plan: Sherry [name] is a 53 y.o. female with past medical history of COPD, depression, hyperlipidemia, hypertension, who presents for evaluation of diarrhea.  The patient presented new onset of diarrhea after restarting antibiotics for episode of meningitis.  This was severe but fortunately resolved a few weeks after finishing her course of antibiotics.  I suspect that her diarrhea was related to antibiotic induced effect but as diarrhea has resolved, no further workup is warranted.  However, she has presented recurrent constipation.  This was previously managed with low-dose Linzess  with very poor improvement.  Metabolic and thyroid function were recently checked and were normal.  Due to this, we will manage her chronic constipation with medium dose Linzess , which the  patient is agreeable to start.  In terms of her recurrent episodes of GERD, she has been taking pantoprazole  with very poor symptom relief.  She is having these episodes regularly.  I emphasized the importance of smoking cessation as this habit is likely contributing to her symptom persistence.  Will switch her medication to omeprazole  40 mg daily.  However, given persistence of symptoms while on PPI, we will proceed with an EGD.  -Start Linzess  145 mcg/day -Schedule EGD  -Start omeprazole  40 mg daily, stop pantoprazole  -Explained presumed etiology of reflux symptoms. Instruction provided in the use of antireflux medication - patient should take medication in the morning 30-45 minutes before eating breakfast. Discussed avoidance of eating within 2 hours of lying down to sleep and benefit of blocks to elevate head of bed. Also, will benefit from avoiding carbonated drinks/sodas or food that has tomatoes, spicy or greasy food. -Smoking cessation  All questions were answered.      Toribio Fortune, MD Gastroenterology and Hepatology [facility] Gastroenterology

## 2024-04-30 NOTE — Patient Instructions (Addendum)
 Start Linzess  145 mcg/day Schedule EGD  Start omeprazole  40 mg daily, stop pantoprazole  Explained presumed etiology of reflux symptoms. Instruction provided in the use of antireflux medication - patient should take medication in the morning 30-45 minutes before eating breakfast. Discussed avoidance of eating within 2 hours of lying down to sleep and benefit of blocks to elevate head of bed. Also, will benefit from avoiding carbonated drinks/sodas or food that has tomatoes, spicy or greasy food. Smoking cessation  --------------------  Linzess  works best when taken once a day every day, on an empty stomach, at least 30 minutes before your first meal of the day.  When Linzess  is taken daily as directed:  *Constipation relief is typically felt in about a week *IBS-C patients may begin to experience relief from belly pain and overall abdominal symptoms (pain, discomfort, and bloating) in about 1 week,   with symptoms typically improving over 12 weeks.  Diarrhea, nausea or abdominal cramping may occur in the first 2 weeks -keep taking it.  These symptoms should eventually resolve. Please notify us  if having more than 4 watery bowel movements per day or fecal soiling accidents.

## 2024-05-01 ENCOUNTER — Other Ambulatory Visit (HOSPITAL_COMMUNITY): Payer: Self-pay | Admitting: Internal Medicine

## 2024-05-01 ENCOUNTER — Telehealth (INDEPENDENT_AMBULATORY_CARE_PROVIDER_SITE_OTHER): Payer: Self-pay

## 2024-05-01 DIAGNOSIS — S22000A Wedge compression fracture of unspecified thoracic vertebra, initial encounter for closed fracture: Secondary | ICD-10-CM

## 2024-05-01 DIAGNOSIS — M546 Pain in thoracic spine: Secondary | ICD-10-CM | POA: Diagnosis not present

## 2024-05-01 NOTE — Telephone Encounter (Signed)
 Spoke with patient, scheduled EGD for 06/01/2024 at 12:00pm. Instructions sent to mychart.

## 2024-05-01 NOTE — Telephone Encounter (Signed)
 PA for EGD: [employer] and they stated that prior auth is not required for EGD. Reference #: [id number]

## 2024-05-02 ENCOUNTER — Ambulatory Visit (HOSPITAL_COMMUNITY)
Admission: RE | Admit: 2024-05-02 | Discharge: 2024-05-02 | Disposition: A | Discharge status: Home / Self Care | Source: Ambulatory Visit | Attending: Internal Medicine | Admitting: Internal Medicine

## 2024-05-02 DIAGNOSIS — M8008XA Age-related osteoporosis with current pathological fracture, vertebra(e), initial encounter for fracture: Secondary | ICD-10-CM | POA: Diagnosis not present

## 2024-05-02 DIAGNOSIS — Z780 Asymptomatic menopausal state: Secondary | ICD-10-CM | POA: Diagnosis not present

## 2024-05-02 DIAGNOSIS — S22000A Wedge compression fracture of unspecified thoracic vertebra, initial encounter for closed fracture: Secondary | ICD-10-CM | POA: Insufficient documentation

## 2024-05-03 ENCOUNTER — Ambulatory Visit: Admitting: Student

## 2024-05-08 ENCOUNTER — Telehealth: Payer: Self-pay | Admitting: Acute Care

## 2024-05-08 NOTE — Telephone Encounter (Signed)
 Returned call to patient to review results of LDCT.  She didn't receive her letter of results but had reviewed it on mychart.  No suspicious findings for lung cancer.  Patient is aware of thoracic spine compression fractures, emphysema and atherosclerosis. States she does take a statin medication.  She also sees Dr. Darlean for pulmonary concerns and can discuss CT results with ILD comments at her next visit.  Order placed for annual 2026 LDCT and results/plan to PCP.

## 2024-05-20 NOTE — Progress Notes (Unsigned)
 CARDIOLOGY CONSULT NOTE       Patient ID: Sherry [name] MRN: [id number] DOB/AGE: 03/19/1971 53 y.o.  Referring Physician: Halbert Primary Physician: Halbert Mariano SQUIBB, DO Primary Cardiologist: New Reason for Consultation: HTN/CAD   HPI:  53 y.o. seen at request of Dr Halbert for HTN. Recent d/c from hospital 03/24/24 with encephalopathy. Extensive w/u including LP, EEG, CT and cultures unrevealing. Patient uses marijuana and on BZ;s. She has a history of COPD with prior left lung collapse and Anxiety/Depression and Bipolar dx. Echo done 03/17/24 during hospitalization showed EF 60-65% normal RV No significant valve dx and no evidence of SBE. Lung cancer CT 04/25/24 sowed no cancer. Emphysema and ? Mild ILD. Noted age advanced 2 vessel coronary calcium.   I last saw her in 2022. For atypical chest pain with numerous ED visits   Cardiac CTA done 02/02/21 with calcium score 29.7 , 93 rd percentile with < 25% stenosis in LAD and RCA  She complains of sharp chest pain all over. Not exertional can be hours. Not pleuritic sounds muscular.   She is anxious and teary eyed. She needs better f/u with behavioral health  ROS All other systems reviewed and negative except as noted above  Past Medical History:  Diagnosis Date   COPD (chronic obstructive pulmonary disease) (HCC)    Depression    High cholesterol    Hypertension    Lung collapse    left    Family History  Problem Relation Age of Onset   Colon polyps Mother    Colon cancer Maternal Grandmother    Colon cancer Maternal Grandfather    Colon cancer Paternal Grandmother    Colon cancer Paternal Grandfather    Colon cancer Paternal Uncle     Social History   Socioeconomic History   Marital status: Significant Other    Spouse name: Not on file   Number of children: Not on file   Years of education: Not on file   Highest education level: Not on file  Occupational History   Not on file  Tobacco Use   Smoking status: Every Day     Current packs/day: 1.00    Average packs/day: 1 pack/day for 39.9 years (39.9 ttl pk-yrs)    Types: Cigarettes    Start date: 13    Passive exposure: Current   Smokeless tobacco: Never  Vaping Use   Vaping status: Never Used  Substance and Sexual Activity   Alcohol use: Not Currently   Drug use: Never   Sexual activity: Yes    Comment: [email or web address]  Other Topics Concern   Not on file  Social History Narrative   Not on file   Social Drivers of Health   Financial Resource Strain: Not on file  Food Insecurity: No Food Insecurity (03/14/2024)   Hunger Vital Sign    Worried About Running Out of Food in the Last Year: Never true    Ran Out of Food in the Last Year: Never true  Transportation Needs: No Transportation Needs (03/14/2024)   PRAPARE - Administrator, Civil Service (Medical): No    Lack of Transportation (Non-Medical): No  Physical Activity: Not on file  Stress: Not on file  Social Connections: Not on file  Intimate Partner Violence: Not At Risk (03/14/2024)   Humiliation, Afraid, Rape, and Kick questionnaire    Fear of Current or Ex-Partner: No    Emotionally Abused: No    Physically Abused: No    Sexually Abused: No  Past Surgical History:  Procedure Laterality Date   CHEST TUBE INSERTION     COLONOSCOPY WITH PROPOFOL  N/A 03/17/2022   Procedure: COLONOSCOPY WITH PROPOFOL ;  Surgeon: Eartha Angelia Sieving, MD;  [facility];  Service: Gastroenterology;  Laterality: N/A;  930 ASA 2   ENDOMETRIAL ABLATION     POLYPECTOMY  03/17/2022   Procedure: POLYPECTOMY;  Surgeon: Eartha Angelia, Sieving, MD;  Location: [facility];  Service: Gastroenterology;;      Current Outpatient Medications:    acetaminophen  (TYLENOL ) 500 MG tablet, Take 1,000 mg by mouth every 6 (six) hours as needed (pain.)., Disp: , Rfl:    albuterol  (VENTOLIN  HFA) 108 (90 Base) MCG/ACT inhaler, Inhale 1-2 puffs into the lungs every 4 (four) hours as needed for shortness of  breath or wheezing., Disp: 18 g, Rfl: 11   alendronate (FOSAMAX) 70 MG tablet, Take 70 mg by mouth once a week., Disp: , Rfl:    clonazePAM  (KLONOPIN ) 0.5 MG tablet, Take 1 tablet (0.5 mg total) by mouth in the morning, at noon, and at bedtime., Disp: 30 tablet, Rfl: 0   cloNIDine  (CATAPRES  - DOSED IN MG/24 HR) 0.1 mg/24hr patch, Place 1 patch (0.1 mg total) onto the skin once a week., Disp: 12 patch, Rfl: 3   FLUoxetine (PROZAC) 10 MG capsule, Take 10 mg by mouth in the morning., Disp: , Rfl:    fluticasone (FLONASE) 50 MCG/ACT nasal spray, Place 2 sprays into both nostrils daily as needed for allergies or rhinitis., Disp: , Rfl:    hydrochlorothiazide (HYDRODIURIL) 25 MG tablet, Take 25 mg by mouth daily., Disp: , Rfl:    linaclotide  (LINZESS ) 145 MCG CAPS capsule, Take 1 capsule (145 mcg total) by mouth daily., Disp: 90 capsule, Rfl: 3   metoprolol  succinate (TOPROL -XL) 50 MG 24 hr tablet, Take 25 mg by mouth 2 (two) times daily., Disp: , Rfl:    omeprazole  (PRILOSEC) 40 MG capsule, Take 1 capsule (40 mg total) by mouth daily., Disp: 90 capsule, Rfl: 3   pantoprazole  (PROTONIX ) 40 MG tablet, Take 40 mg by mouth daily., Disp: , Rfl:    QUEtiapine  (SEROQUEL ) 400 MG tablet, Take 1 tablet (400 mg total) by mouth at bedtime., Disp: 10 tablet, Rfl: 0   rosuvastatin (CRESTOR) 40 MG tablet, Take 40 mg by mouth daily., Disp: , Rfl:     Physical Exam: Blood pressure 138/78, pulse 81, height 5' 5 (1.651 m), weight 140 lb 12.8 oz (63.9 kg), SpO2 96%.   Affect appropriate Chronically ill female COPD HEENT: normal Neck supple with no adenopathy JVP normal no bruits no thyromegaly Lungs clear with no wheezing and good diaphragmatic motion Heart:  S1/S2 no murmur, no rub, gallop or click PMI normal Abdomen: benighn, BS positve, no tenderness, no AAA no bruit.  No HSM or HJR Distal pulses intact with no bruits No edema Neuro non-focal Skin warm and dry No muscular weakness   Labs:   Lab  Results  Component Value Date   WBC 6.3 03/24/2024   HGB 11.9 (L) 03/24/2024   HCT 36.9 03/24/2024   MCV 97.9 03/24/2024   PLT 383 03/24/2024   No results for input(s): NA, K, CL, CO2, BUN, CREATININE, CALCIUM, PROT, BILITOT, ALKPHOS, ALT, AST, GLUCOSE in the last 168 hours.  Invalid input(s): LABALBU Lab Results  Component Value Date   TROPONINI <0.03 12/02/2018   No results found for: CHOL No results found for: HDL No results found for: LDLCALC No results found for: TRIG No  results found for: CHOLHDL No results found for: LDLDIRECT    Radiology: MM 3D SCREENING MAMMOGRAM BILATERAL BREAST Result Date: 05/03/2024 CLINICAL DATA:  Screening. EXAM: DIGITAL SCREENING BILATERAL MAMMOGRAM WITH TOMOSYNTHESIS AND CAD TECHNIQUE: Bilateral screening digital craniocaudal and mediolateral oblique mammograms were obtained. Bilateral screening digital breast tomosynthesis was performed. The images were evaluated with computer-aided detection. COMPARISON:  Previous exam(s). ACR Breast Density Category b: There are scattered areas of fibroglandular density. FINDINGS: There are no findings suspicious for malignancy. IMPRESSION: No mammographic evidence of malignancy. A result letter of this screening mammogram will be mailed directly to the patient. RECOMMENDATION: Screening mammogram in one year. (Code:SM-B-01Y) BI-RADS CATEGORY  1: Negative. Electronically Signed   By: Inocente Ast M.D.   On: 05/03/2024 08:50   DG BONE DENSITY (DXA) Result Date: 05/02/2024 EXAM: DUAL X-RAY ABSORPTIOMETRY (DXA) FOR BONE MINERAL DENSITY 05/02/2024 12:23 pm CLINICAL DATA:  53 year old Female Postmenopausal. Compression fracture of body of thoracic vertebra/compression fracture of thoracic spine. BMD to evaluate for osteoporosis History of fragility fracture. History of vertebral fracture. Patient is or has been on glucocorticoid therapy. TECHNIQUE: An axial (e.g., hips, spine)  and/or appendicular (e.g., radius) exam was performed, as appropriate, using [employer] [facility]. Images are obtained for bone mineral density measurement and are not obtained for diagnostic purposes. [id number] Exclusions: L4. COMPARISON:  None. FINDINGS: Scan quality: Good. LUMBAR SPINE (L1-L3): BMD (in g/cm2): 0.830 T-score: -2.8 Z-score: -2.1 LEFT FEMORAL NECK: BMD (in g/cm2): 0.733 T-score: -2.2 Z-score: -1.2 LEFT TOTAL HIP: BMD (in g/cm2): 0.732 T-score: -2.2 Z-score: -1.6 RIGHT FEMORAL NECK: BMD (in g/cm2): 0.703 T-score: -2.4 Z-score: -1.4 RIGHT TOTAL HIP: BMD (in g/cm2): 0.710 T-score: -2.4 Z-score: -1.7 FRAX 10-YEAR PROBABILITY OF FRACTURE: FRAX not reported as the lowest BMD is not in the osteopenia range. IMPRESSION: Osteoporosis based on BMD. Fracture risk is increased. Increased risk is based on low BMD and history of vertebral fracture. RECOMMENDATIONS: 1. All patients should optimize calcium and vitamin D intake. 2. Consider FDA-approved medical therapies in postmenopausal women and men aged 5 years and older, based on the following: - A hip or vertebral (clinical or morphometric) fracture - T-score less than or equal to -2.5 and secondary causes have been excluded. - Low bone mass (T-score between -1.0 and -2.5) and a 10-year probability of a hip fracture greater than or equal to 3% or a 10-year probability of a major osteoporosis-related fracture greater than or equal to 20% based on the [place] -adapted WHO algorithm. - Clinician judgment and/or patient preferences may indicate treatment for people with 10-year fracture probabilities above or below these levels 3. Patients with diagnosis of osteoporosis or at high risk for fracture should have regular bone mineral density tests. For patients eligible for Medicare, routine testing is allowed once every 2 years. The testing frequency can be increased to one year for patients who have rapidly progressing disease, those who  are receiving or discontinuing medical therapy to restore bone mass, or have additional risk factors. Electronically Signed   By: Dina  Arceo M.D.   On: 05/02/2024 13:43    EKG: 03/16/2024 SR LAD poor R wave progression S waves with T inversions in V1-3    ASSESSMENT AND PLAN:   CAD: in patient with HTN, smoking and family history of premature CAD. She had cardiac CTA 2022 without obstructive CAD. Recent echo with normal EF. ECG abnormal with LAD and poor R wave progression. Prior calcium noted in RCA/LAD shared decision making  favor Ex myovue to further risk stratify here at [facility] HTN Rx with clonidine  and lopressor  per primary HLD on statin  Bipolar on prozac, klonopin  and seroquel  Not well Rx needs to reestablish with behavioral health. Somatic complaints likely from this  COPD:  smoking cessation discussed. On ventolin . Recent CT with emphysema and mild ILD. F/u primary consider pulmonary referral  Ex Myovue  F/U PRN pending studies   Signed: Maude Emmer 05/23/2024, 1:52 PM

## 2024-05-23 ENCOUNTER — Encounter: Payer: Self-pay | Admitting: Cardiovascular Disease

## 2024-05-23 ENCOUNTER — Encounter: Payer: Self-pay | Admitting: *Deleted

## 2024-05-23 ENCOUNTER — Ambulatory Visit: Attending: Cardiovascular Disease | Admitting: Cardiovascular Disease

## 2024-05-23 VITALS — BP 138/78 | HR 81 | Ht 65.0 in | Wt 140.8 lb

## 2024-05-23 DIAGNOSIS — F17210 Nicotine dependence, cigarettes, uncomplicated: Secondary | ICD-10-CM | POA: Diagnosis not present

## 2024-05-23 DIAGNOSIS — J449 Chronic obstructive pulmonary disease, unspecified: Secondary | ICD-10-CM | POA: Diagnosis not present

## 2024-05-23 DIAGNOSIS — I10 Essential (primary) hypertension: Secondary | ICD-10-CM | POA: Diagnosis not present

## 2024-05-23 DIAGNOSIS — R079 Chest pain, unspecified: Secondary | ICD-10-CM | POA: Diagnosis not present

## 2024-05-23 NOTE — Patient Instructions (Signed)
 Medication Instructions:  Your physician recommends that you continue on your current medications as directed. Please refer to the Current Medication list given to you today.  *If you need a refill on your cardiac medications before your next appointment, please call your pharmacy*  Lab Work: NONE   If you have labs (blood work) drawn today and your tests are completely normal, you will receive your results only by: MyChart Message (if you have MyChart) OR A paper copy in the mail If you have any lab test that is abnormal or we need to change your treatment, we will call you to review the results.  Testing/Procedures: Your physician has requested that you have en exercise stress myoview. For further information please visit [email or web address] Please follow instruction sheet, as given.   Follow-Up: At [facility], you and your health needs are our priority.  As part of our continuing mission to provide you with exceptional heart care, our providers are all part of one team.  This team includes your primary Cardiologist (physician) and Advanced Practice Providers or APPs (Physician Assistants and Nurse Practitioners) who all work together to provide you with the care you need, when you need it.  Your next appointment:    As Needed   Provider:   Maude Emmer, MD    We recommend signing up for the patient portal called MyChart.  Sign up information is provided on this After Visit Summary.  MyChart is used to connect with patients for Virtual Visits (Telemedicine).  Patients are able to view lab/test results, encounter notes, upcoming appointments, etc.  Non-urgent messages can be sent to your provider as well.   To learn more about what you can do with MyChart, go to [email or web address].   Other Instructions Thank you for choosing [facility]!

## 2024-05-26 DIAGNOSIS — I5032 Chronic diastolic (congestive) heart failure: Secondary | ICD-10-CM | POA: Diagnosis not present

## 2024-05-26 DIAGNOSIS — D638 Anemia in other chronic diseases classified elsewhere: Secondary | ICD-10-CM | POA: Diagnosis not present

## 2024-05-26 DIAGNOSIS — E785 Hyperlipidemia, unspecified: Secondary | ICD-10-CM | POA: Diagnosis not present

## 2024-05-26 DIAGNOSIS — R809 Proteinuria, unspecified: Secondary | ICD-10-CM | POA: Diagnosis not present

## 2024-05-28 ENCOUNTER — Other Ambulatory Visit (HOSPITAL_COMMUNITY): Payer: Self-pay | Admitting: Nephrology

## 2024-05-28 DIAGNOSIS — S22060A Wedge compression fracture of T7-T8 vertebra, initial encounter for closed fracture: Secondary | ICD-10-CM | POA: Diagnosis not present

## 2024-05-28 DIAGNOSIS — D638 Anemia in other chronic diseases classified elsewhere: Secondary | ICD-10-CM

## 2024-05-28 DIAGNOSIS — N200 Calculus of kidney: Secondary | ICD-10-CM

## 2024-05-28 DIAGNOSIS — N181 Chronic kidney disease, stage 1: Secondary | ICD-10-CM

## 2024-05-28 DIAGNOSIS — R809 Proteinuria, unspecified: Secondary | ICD-10-CM

## 2024-05-28 DIAGNOSIS — S32050A Wedge compression fracture of fifth lumbar vertebra, initial encounter for closed fracture: Secondary | ICD-10-CM | POA: Diagnosis not present

## 2024-05-30 ENCOUNTER — Telehealth (HOSPITAL_COMMUNITY): Payer: Self-pay | Admitting: Surgery

## 2024-05-30 ENCOUNTER — Ambulatory Visit (HOSPITAL_BASED_OUTPATIENT_CLINIC_OR_DEPARTMENT_OTHER)
Admission: RE | Admit: 2024-05-30 | Discharge: 2024-05-30 | Disposition: A | Discharge status: Home / Self Care | Source: Ambulatory Visit | Attending: Cardiovascular Disease | Admitting: Cardiovascular Disease

## 2024-05-30 ENCOUNTER — Other Ambulatory Visit: Payer: Self-pay | Admitting: Physician Assistant

## 2024-05-30 ENCOUNTER — Ambulatory Visit (HOSPITAL_COMMUNITY)
Admission: RE | Admit: 2024-05-30 | Discharge: 2024-05-30 | Disposition: A | Discharge status: Home / Self Care | Source: Ambulatory Visit | Attending: Cardiovascular Disease | Admitting: Cardiovascular Disease

## 2024-05-30 ENCOUNTER — Encounter (HOSPITAL_COMMUNITY): Payer: Self-pay

## 2024-05-30 DIAGNOSIS — R079 Chest pain, unspecified: Secondary | ICD-10-CM | POA: Diagnosis not present

## 2024-05-30 LAB — NM MYOCAR MULTI W/SPECT W/WALL MOTION / EF
Base ST Depression (mm): 0 mm
Estimated workload: 1
LV dias vol: 77 mL (ref 46–106)
LV sys vol: 25 mL (ref 3.8–5.2)
MPHR: 166 {beats}/min
Nuc Stress EF: 67 %
Peak HR: 92 {beats}/min
Percent HR: 55 %
RATE: 0.4
Rest HR: 66 {beats}/min
Rest Nuclear Isotope Dose: 11 mCi
SDS: 1
SRS: 0
SSS: 1
ST Depression (mm): 0 mm
Stress Nuclear Isotope Dose: 32.5 mCi
TID: 1.15

## 2024-05-30 MED ORDER — TECHNETIUM TC 99M TETROFOSMIN IV KIT
10.0000 | PACK | Freq: Once | INTRAVENOUS | Status: AC | PRN
Start: 2024-05-30 — End: 2024-05-30
  Administered 2024-05-30: 11 via INTRAVENOUS

## 2024-05-30 MED ORDER — SODIUM CHLORIDE FLUSH 0.9 % IV SOLN
INTRAVENOUS | Status: AC
  Administered 2024-05-30: 10 mL via INTRAVENOUS
  Filled 2024-05-30: qty 10

## 2024-05-30 MED ORDER — REGADENOSON 0.4 MG/5ML IV SOLN
INTRAVENOUS | Status: AC
  Administered 2024-05-30: 0.4 mg via INTRAVENOUS
  Filled 2024-05-30: qty 5

## 2024-05-30 MED ORDER — TECHNETIUM TC 99M TETROFOSMIN IV KIT
30.0000 | PACK | Freq: Once | INTRAVENOUS | Status: AC | PRN
  Administered 2024-05-30: 32.5 via INTRAVENOUS

## 2024-05-30 NOTE — Telephone Encounter (Signed)
 I called the pt this am to see if she had taken her Metoprolol .  The pt stated that she had already taken it this morning.  I told her I would check with our PA to see if she could still walk on the treadmill for her stress test.  I notified Lorette Kapur, PA-C, and she stated that would could try the patient on the treadmill, but she probably would not reach her target heart rate.  However, we could convert the pt to a Lexiscan myoview.    I called the pt back and explained the difference in the stress myoview (treadmill) vs the [employer] (medication).  I went over the most common side effects of the medication, and the pt verbalized understanding and is willing to proceed.

## 2024-05-30 NOTE — Progress Notes (Signed)
     Sherry [name] presented for a Lexiscan nuclear stress test today.  I Lorette CINDERELLA Kapur, PA-C, provided direct supervision and was present during the stress portion of the study today, which was completed without significant symptoms, immediate complications, or acute ST/T changes on ECG.  Stress imaging is pending at this time.  Preliminary ECG findings may be listed in the chart, but the stress test result will not be finalized until perfusion imaging is complete.  Lorette CINDERELLA Kapur, PA-C  05/30/2024, 10:35 AM

## 2024-05-31 ENCOUNTER — Ambulatory Visit: Payer: Self-pay | Admitting: Cardiovascular Disease

## 2024-05-31 ENCOUNTER — Ambulatory Visit: Admitting: Internal Medicine

## 2024-06-01 ENCOUNTER — Encounter (HOSPITAL_COMMUNITY): Admission: RE | Disposition: A | Payer: Self-pay | Source: Home / Self Care | Attending: Gastroenterology

## 2024-06-01 ENCOUNTER — Encounter (HOSPITAL_COMMUNITY): Payer: Self-pay | Admitting: Certified Registered Nurse Anesthetist

## 2024-06-01 ENCOUNTER — Encounter (HOSPITAL_COMMUNITY): Payer: Self-pay | Admitting: Gastroenterology

## 2024-06-01 ENCOUNTER — Ambulatory Visit (HOSPITAL_COMMUNITY): Payer: Self-pay | Admitting: Certified Registered Nurse Anesthetist

## 2024-06-01 ENCOUNTER — Ambulatory Visit (HOSPITAL_COMMUNITY)
Admission: RE | Admit: 2024-06-01 | Discharge: 2024-06-01 | Disposition: A | Discharge status: Home / Self Care | Attending: Gastroenterology | Admitting: Gastroenterology

## 2024-06-01 ENCOUNTER — Other Ambulatory Visit: Payer: Self-pay

## 2024-06-01 DIAGNOSIS — K2270 Barrett's esophagus without dysplasia: Secondary | ICD-10-CM

## 2024-06-01 DIAGNOSIS — F17210 Nicotine dependence, cigarettes, uncomplicated: Secondary | ICD-10-CM | POA: Diagnosis not present

## 2024-06-01 DIAGNOSIS — I10 Essential (primary) hypertension: Secondary | ICD-10-CM | POA: Diagnosis not present

## 2024-06-01 DIAGNOSIS — E7800 Pure hypercholesterolemia, unspecified: Secondary | ICD-10-CM | POA: Diagnosis not present

## 2024-06-01 DIAGNOSIS — F32A Depression, unspecified: Secondary | ICD-10-CM | POA: Diagnosis not present

## 2024-06-01 DIAGNOSIS — Z79899 Other long term (current) drug therapy: Secondary | ICD-10-CM | POA: Diagnosis not present

## 2024-06-01 DIAGNOSIS — K449 Diaphragmatic hernia without obstruction or gangrene: Secondary | ICD-10-CM | POA: Diagnosis not present

## 2024-06-01 DIAGNOSIS — K219 Gastro-esophageal reflux disease without esophagitis: Secondary | ICD-10-CM

## 2024-06-01 DIAGNOSIS — J449 Chronic obstructive pulmonary disease, unspecified: Secondary | ICD-10-CM | POA: Insufficient documentation

## 2024-06-01 DIAGNOSIS — K2100 Gastro-esophageal reflux disease with esophagitis, without bleeding: Secondary | ICD-10-CM | POA: Diagnosis present

## 2024-06-01 HISTORY — PX: ESOPHAGOGASTRODUODENOSCOPY: SHX5428

## 2024-06-01 SURGERY — EGD (ESOPHAGOGASTRODUODENOSCOPY)
Anesthesia: General

## 2024-06-01 MED ORDER — LACTATED RINGERS IV SOLN: INTRAVENOUS | Status: DC | PRN

## 2024-06-01 MED ORDER — PROPOFOL 500 MG/50ML IV EMUL
INTRAVENOUS | Status: AC
Start: 2024-06-01 — End: 2024-06-01
  Filled 2024-06-01: qty 50

## 2024-06-01 MED ORDER — PROPOFOL 10 MG/ML IV BOLUS
INTRAVENOUS | Status: DC | PRN
  Administered 2024-06-01: 180 ug/kg/min via INTRAVENOUS
  Administered 2024-06-01: 80 mg via INTRAVENOUS

## 2024-06-01 MED ORDER — LIDOCAINE 2% (20 MG/ML) 5 ML SYRINGE
INTRAMUSCULAR | Status: DC | PRN
  Administered 2024-06-01: 25 mg via INTRAVENOUS

## 2024-06-01 NOTE — H&P (Signed)
 Sherry [name] is an 53 y.o. female.   Chief Complaint: GERD HPI: Sherry [name] is a 53 y.o. female with past medical history of COPD, depression, hyperlipidemia, hypertension, who presents for persistent GERD.  The patient reports having improvement of her heartburn while taking omeprazole  40 mg daily.  No dysphagia, odynophagia, fever, chills, bowel pain or abdominal distention.  Past Medical History:  Diagnosis Date   COPD (chronic obstructive pulmonary disease) (HCC)    Depression    High cholesterol    Hypertension    Lung collapse    left    Past Surgical History:  Procedure Laterality Date   CHEST TUBE INSERTION     COLONOSCOPY WITH PROPOFOL  N/A 03/17/2022   Procedure: COLONOSCOPY WITH PROPOFOL ;  Surgeon: Eartha Angelia Sieving, MD;  [facility];  Service: Gastroenterology;  Laterality: N/A;  930 ASA 2   ENDOMETRIAL ABLATION     POLYPECTOMY  03/17/2022   Procedure: POLYPECTOMY;  Surgeon: Eartha Angelia Sieving, MD;  [facility]: [facility];  Service: Gastroenterology;;    Family History  Problem Relation Age of Onset   Colon polyps Mother    Colon cancer Maternal Grandmother    Colon cancer Maternal Grandfather    Colon cancer Paternal Grandmother    Colon cancer Paternal Grandfather    Colon cancer Paternal Uncle    Social History:  reports that she has been smoking cigarettes. She started smoking about 39 years ago. She has a 39.9 pack-year smoking history. She has been exposed to tobacco smoke. She has never used smokeless tobacco. She reports that she does not currently use alcohol. She reports current drug use. Drug: Marijuana.  Allergies:  Allergies  Allergen Reactions   Dramamine [Dimenhydrinate] Swelling and Other (See Comments)    Made her feel really drunk. Swelling of tongue     Prednisone  Other (See Comments)    Feels like fingers are swelling    Medications Prior to Admission  Medication Sig Dispense Refill   clonazePAM  (KLONOPIN ) 0.5 MG  tablet Take 1 tablet (0.5 mg total) by mouth in the morning, at noon, and at bedtime. 30 tablet 0   cloNIDine  (CATAPRES  - DOSED IN MG/24 HR) 0.1 mg/24hr patch Place 1 patch (0.1 mg total) onto the skin once a week. 12 patch 3   FLUoxetine (PROZAC) 10 MG capsule Take 10 mg by mouth in the morning.     fluticasone (FLONASE) 50 MCG/ACT nasal spray Place 2 sprays into both nostrils daily as needed for allergies or rhinitis.     hydrochlorothiazide (HYDRODIURIL) 25 MG tablet Take 25 mg by mouth daily.     linaclotide  (LINZESS ) 145 MCG CAPS capsule Take 1 capsule (145 mcg total) by mouth daily. 90 capsule 3   metoprolol  succinate (TOPROL -XL) 50 MG 24 hr tablet Take 25 mg by mouth 2 (two) times daily.     omeprazole  (PRILOSEC) 40 MG capsule Take 1 capsule (40 mg total) by mouth daily. 90 capsule 3   rosuvastatin (CRESTOR) 40 MG tablet Take 40 mg by mouth daily.     acetaminophen  (TYLENOL ) 500 MG tablet Take 1,000 mg by mouth every 6 (six) hours as needed (pain.).     albuterol  (VENTOLIN  HFA) 108 (90 Base) MCG/ACT inhaler Inhale 1-2 puffs into the lungs every 4 (four) hours as needed for shortness of breath or wheezing. 18 g 11   alendronate (FOSAMAX) 70 MG tablet Take 70 mg by mouth once a week.     pantoprazole  (PROTONIX ) 40 MG tablet Take  40 mg by mouth daily.     QUEtiapine  (SEROQUEL ) 400 MG tablet Take 1 tablet (400 mg total) by mouth at bedtime. 10 tablet 0    No results found for this or any previous visit (from the past 48 hours). NM Myocar Multi W/Spect W/Wall Motion / EF Result Date: 05/30/2024   The study is normal. There are no perfusion defects. The study is low risk.   No ST deviation was noted.   LV perfusion is normal.   Left ventricular function is normal. Nuclear stress EF: 67%. The left ventricular ejection fraction is hyperdynamic (>65%). End diastolic cavity size is normal.    Review of Systems  All other systems reviewed and are negative.   Blood pressure 125/78, pulse 71,  temperature 98.2 F (36.8 C), temperature source Oral, resp. rate 17, SpO2 97%. Physical Exam  GENERAL: The patient is AO x3, in no acute distress. HEENT: Head is normocephalic and atraumatic. EOMI are intact. Mouth is well hydrated and without lesions. NECK: Supple. No masses LUNGS: Clear to auscultation. No presence of rhonchi/wheezing/rales. Adequate chest expansion HEART: RRR, normal s1 and s2. ABDOMEN: Soft, nontender, no guarding, no peritoneal signs, and nondistended. BS +. No masses. EXTREMITIES: Without any cyanosis, clubbing, rash, lesions or edema. NEUROLOGIC: AOx3, no focal motor deficit. SKIN: no jaundice, no rashes  Assessment/Plan Sherry [name] is a 53 y.o. female with past medical history of COPD, depression, hyperlipidemia, hypertension, who presents for persistent GERD.  Will proceed with EGD.  [name], MD 06/01/2024, 10:51 AM

## 2024-06-01 NOTE — Anesthesia Preprocedure Evaluation (Addendum)
 Anesthesia Evaluation  Patient identified by MRN, date of birth, ID band Patient awake    Reviewed: Allergy & Precautions, NPO status , Patient's Chart, lab work & pertinent test results, reviewed documented beta blocker date and time   Airway Mallampati: III  TM Distance: >3 FB Neck ROM: Full    Dental  (+) Upper Dentures, Lower Dentures   Pulmonary shortness of breath and with exertion, COPD,  COPD inhaler, Current SmokerPatient did not abstain from smoking. History acute respiratory failure with hypoxia   Pulmonary exam normal breath sounds clear to auscultation       Cardiovascular Exercise Tolerance: Good hypertension, Pt. on medications and Pt. on home beta blockers Normal cardiovascular exam Rhythm:Regular Rate:Normal     Neuro/Psych  PSYCHIATRIC DISORDERS  Depression    negative neurological ROS     GI/Hepatic Neg liver ROS,GERD  Medicated and Controlled,,  Endo/Other  negative endocrine ROS    Renal/GU negative Renal ROS  negative genitourinary   Musculoskeletal negative musculoskeletal ROS (+)    Abdominal   Peds negative pediatric ROS (+)  Hematology negative hematology ROS (+)   Anesthesia Other Findings History of acute metabolic encephalopathy and sepsis  Reproductive/Obstetrics negative OB ROS                              Anesthesia Physical Anesthesia Plan  ASA: 3  Anesthesia Plan: General   Post-op Pain Management: Minimal or no pain anticipated   Induction: Intravenous  PONV Risk Score and Plan: Propofol  infusion  Airway Management Planned: Nasal Cannula and Natural Airway  Additional Equipment:   Intra-op Plan:   Post-operative Plan:   Informed Consent: I have reviewed the patients History and Physical, chart, labs and discussed the procedure including the risks, benefits and alternatives for the proposed anesthesia with the patient or authorized  representative who has indicated his/her understanding and acceptance.     Dental advisory given  Plan Discussed with: CRNA and Surgeon  Anesthesia Plan Comments:          Anesthesia Quick Evaluation

## 2024-06-01 NOTE — Discharge Instructions (Signed)
 You are being discharged to home.  Resume your previous diet.  Continue your present medications.  We are waiting for your pathology results.

## 2024-06-01 NOTE — Anesthesia Postprocedure Evaluation (Signed)
 Anesthesia Post Note  Patient: Sherry [name]  Procedure(s) Performed: EGD (ESOPHAGOGASTRODUODENOSCOPY)  Patient location during evaluation: Endoscopy Anesthesia Type: General Level of consciousness: awake and alert Pain management: pain level controlled Vital Signs Assessment: post-procedure vital signs reviewed and stable Respiratory status: spontaneous breathing, nonlabored ventilation and respiratory function stable Cardiovascular status: stable Anesthetic complications: no   There were no known notable events for this encounter.   Last Vitals:  Vitals:   06/01/24 1044 06/01/24 1118  BP: 125/78 127/77  Pulse: 71 64  Resp: 17 (!) 21  Temp: 36.8 C 36.8 C  SpO2: 97% 95%    Last Pain:  Vitals:   06/01/24 1118  TempSrc: Oral  PainSc: 0-No pain                 Leidy Massar L Shakendra Griffeth

## 2024-06-01 NOTE — Transfer of Care (Signed)
 Immediate Anesthesia Transfer of Care Note  Patient: Sherry [name]  Procedure(s) Performed: EGD (ESOPHAGOGASTRODUODENOSCOPY)  Patient Location: Endoscopy Unit  Anesthesia Type:General  Level of Consciousness: awake, alert , and oriented  Airway & Oxygen Therapy: Patient Spontanous Breathing  Post-op Assessment: Report given to RN, Post -op Vital signs reviewed and stable, Patient moving all extremities X 4, and Patient able to stick tongue midline  Post vital signs: Reviewed and stable  Last Vitals:  Vitals Value Taken Time  BP 127/77 06/01/24 11:18  Temp 36.8 C 06/01/24 11:18  Pulse 64 06/01/24 11:18  Resp 21 06/01/24 11:18  SpO2 95 % 06/01/24 11:18    Last Pain:  Vitals:   06/01/24 1118  TempSrc: Oral  PainSc: 0-No pain      Patients Stated Pain Goal: 8 (06/01/24 1044)  Complications: No notable events documented.

## 2024-06-01 NOTE — Op Note (Signed)
 [facility] Patient Name: Sherry [name] Procedure Date: 06/01/2024 10:49 AM MRN: [id number] Date of Birth: May 19, 1971 Attending MD: Toribio Fortune , , [id number] CSN: [id number] Age: 53 Admit Type: Outpatient Procedure:                Upper GI endoscopy Indications:              Gastro-esophageal reflux disease Providers:                Toribio Fortune, Olam Ada, RN, Dorcas Lenis,                            Technician Referring MD:              Medicines:                Monitored Anesthesia Care Complications:            No immediate complications. Estimated Blood Loss:     Estimated blood loss: none. Procedure:                Pre-Anesthesia Assessment:                           - Prior to the procedure, a History and Physical                            was performed, and patient medications, allergies                            and sensitivities were reviewed. The patient's                            tolerance of previous anesthesia was reviewed.                           - The risks and benefits of the procedure and the                            sedation options and risks were discussed with the                            patient. All questions were answered and informed                            consent was obtained.                           - ASA Grade Assessment: II - A patient with mild                            systemic disease.                           After obtaining informed consent, the endoscope was                            passed under direct vision. Throughout the  procedure, the patient's blood pressure, pulse, and                            oxygen saturations were monitored continuously. The                            [id number] ([id number]) Upper was introduced through                            the mouth, and advanced to the second part of                            duodenum. The upper GI endoscopy was accomplished                             without difficulty. The patient tolerated the                            procedure well. Scope In: 11:07:55 AM Scope Out: 11:11:56 AM Total Procedure Duration: 0 hours 4 minutes 1 second  Findings:      The esophagus and gastroesophageal junction were examined with white       light and narrow band imaging (NBI). Barrett's esophagus was present.       This involved the mucosa extending to the Z-line (36 cm from the       incisors). Squamous islands were present at 36 cm. The maximum       longitudinal extent of these esophageal mucosal changes was 0.5 cm in       length. This was biopsied with a cold forceps for histology.      A 2 cm hiatal hernia was present.      The stomach was normal.      The examined duodenum was normal. Impression:               - Possible Barrett's esophagus. Biopsied.                           - 2 cm hiatal hernia.                           - Normal stomach.                           - Normal examined duodenum. Moderate Sedation:      Per Anesthesia Care Recommendation:           - Discharge patient to home (ambulatory).                           - Resume previous diet.                           - Continue present medications.                           - Await pathology results. Procedure Code(s):        --- Professional ---  56760, Esophagogastroduodenoscopy, flexible,                            transoral; with biopsy, single or multiple Diagnosis Code(s):        --- Professional ---                           K22.70, Barrett's esophagus without dysplasia                           K44.9, Diaphragmatic hernia without obstruction or                            gangrene                           K21.9, Gastro-esophageal reflux disease without                            esophagitis CPT copyright 2022 [employer] Association. All rights reserved. The codes documented in this report are preliminary and upon coder  review may  be revised to meet current compliance requirements. Toribio Fortune, MD Toribio Fortune,  06/01/2024 11:16:13 AM This report has been signed electronically. Number of Addenda: 0

## 2024-06-04 ENCOUNTER — Ambulatory Visit (INDEPENDENT_AMBULATORY_CARE_PROVIDER_SITE_OTHER): Payer: Self-pay | Admitting: Gastroenterology

## 2024-06-04 ENCOUNTER — Ambulatory Visit (HOSPITAL_COMMUNITY)
Admission: RE | Admit: 2024-06-04 | Discharge: 2024-06-04 | Disposition: A | Discharge status: Home / Self Care | Source: Ambulatory Visit | Attending: Nephrology | Admitting: Nephrology

## 2024-06-04 DIAGNOSIS — D638 Anemia in other chronic diseases classified elsewhere: Secondary | ICD-10-CM | POA: Insufficient documentation

## 2024-06-04 DIAGNOSIS — N181 Chronic kidney disease, stage 1: Secondary | ICD-10-CM | POA: Diagnosis not present

## 2024-06-04 DIAGNOSIS — N200 Calculus of kidney: Secondary | ICD-10-CM | POA: Insufficient documentation

## 2024-06-04 DIAGNOSIS — R809 Proteinuria, unspecified: Secondary | ICD-10-CM | POA: Diagnosis not present

## 2024-06-04 LAB — SURGICAL PATHOLOGY

## 2024-06-04 NOTE — Progress Notes (Signed)
 Patient result letter mailed

## 2024-06-05 ENCOUNTER — Encounter (HOSPITAL_COMMUNITY): Payer: Self-pay | Admitting: Gastroenterology

## 2024-06-05 ENCOUNTER — Other Ambulatory Visit (HOSPITAL_COMMUNITY): Payer: Self-pay

## 2024-06-05 DIAGNOSIS — S22060A Wedge compression fracture of T7-T8 vertebra, initial encounter for closed fracture: Secondary | ICD-10-CM

## 2024-06-14 ENCOUNTER — Ambulatory Visit (HOSPITAL_COMMUNITY): Admission: RE | Admit: 2024-06-14 | Discharge: 2024-06-14 | Disposition: A | Source: Ambulatory Visit

## 2024-06-14 DIAGNOSIS — S22060A Wedge compression fracture of T7-T8 vertebra, initial encounter for closed fracture: Secondary | ICD-10-CM | POA: Insufficient documentation

## 2024-06-14 DIAGNOSIS — S22080A Wedge compression fracture of T11-T12 vertebra, initial encounter for closed fracture: Secondary | ICD-10-CM | POA: Diagnosis not present

## 2024-06-21 ENCOUNTER — Telehealth: Payer: Self-pay | Admitting: Neuroradiology

## 2024-06-21 NOTE — Telephone Encounter (Signed)
 Left VM to call back and schedule an appt with Dr. Durenda

## 2024-06-27 ENCOUNTER — Encounter: Payer: Self-pay | Admitting: Neuroradiology

## 2024-06-27 ENCOUNTER — Ambulatory Visit: Admitting: Neuroradiology

## 2024-06-27 VITALS — BP 96/64 | HR 78 | Ht 65.0 in | Wt 145.0 lb

## 2024-06-27 DIAGNOSIS — M8088XA Other osteoporosis with current pathological fracture, vertebra(e), initial encounter for fracture: Secondary | ICD-10-CM | POA: Diagnosis not present

## 2024-06-27 DIAGNOSIS — S22000A Wedge compression fracture of unspecified thoracic vertebra, initial encounter for closed fracture: Secondary | ICD-10-CM

## 2024-06-27 NOTE — Progress Notes (Signed)
 Chief Complaint: Patient was seen in consultation today for  Chief Complaint  Patient presents with   New Patient (Initial Visit)    Pt states that back and shoulder blades are hurting   at the request of Mullis,Kiersten P  Referring Physician(s): Mullis,Kiersten P  Sherry [name] is a 53 y.o. 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 Pain 7/10. Fractures from severe osteoporosis. Conservative management preferred due to 10% risk of adjacent fractures with vertebroplasty or kyphoplasty. - Initiate physical therapy. - Prescribe thoracic back brace. - Scheduled follow-up in 4 weeks to assess pain and function. - Will discuss vertebroplasty or kyphoplasty if no improvement in 4 weeks.  Osteoporosis Severe osteoporosis with risk of further fractures due to bone fragility. - Discussed importance of managing osteoporosis to prevent further fractures.  Atypical chest pain with recent normal pharmacologic stress test and normal echocardiogram 05/30/2024/03/17/2024 respectively     Discussed the use of AI scribe software for clinical note transcription with the patient, who gave verbal consent to proceed.  History of Present Illness Sherry [name] is a 53 year old female with osteoporosis who presents with worsening back pain and compression fractures. She is accompanied by her fianc. She was referred by Dr. Halbert for evaluation of her back pain and potential surgical intervention.  She has been experiencing worsening back pain over the past three months. The pain is localized on the right side of her spine, and in the interscapular region. She manages the pain with Tylenol , muscle relaxers, and oxycodone  5 mg, taking three to four tablets daily for the past month. The medication helps with the pain and aids her  sleep, although she still experiences trouble sleeping at times.  She has a history of osteoporosis, recently discovered, and her thoracic spine MRI from June 14, 2024, shows mild compression fractures at T7, T8, and T11 with mild edema, but normal intervertebral discs and no facet arthritis.  She has a history of COPD, having smoked since she was [age], totaling about forty years of smoking. She wants to quit smoking. She experiences frequent shortness of breath, which she feels constantly, and has been feeling particularly short of breath over the last few days.  She has hypertension and uses a clonidine  patch for management. She experiences chest pain that radiates to her left shoulder blade and sometimes to her neck, occurring even when she is at rest. The chest pain can last from five to thirty minutes and is accompanied by shortness of breath.  She has a recent negative cardiac evaluation within the last couple of months.  Three months ago, she was hospitalized for ten days due to an episode initially thought to be a nervous breakdown, but later considered to be possibly related to meningitis or cerebritis. She does not recall the events leading to her hospitalization but was informed that her brain was swollen.   She reports limited physical activity due to her symptoms, stating she can walk about a hundred yards but becomes out of breath. She has not been engaging in regular exercise.    Past Medical History:  Diagnosis Date   COPD (chronic obstructive pulmonary disease) (HCC)    Depression    High cholesterol    Hypertension    Lung collapse    left    Past Surgical History:  Procedure Laterality Date  CHEST TUBE INSERTION     COLONOSCOPY WITH PROPOFOL  N/A 03/17/2022   Procedure: COLONOSCOPY WITH PROPOFOL ;  Surgeon: Eartha Angelia Sieving, MD;  Location: [facility];  Service: Gastroenterology;  Laterality: N/A;  930 ASA 2   ENDOMETRIAL ABLATION      ESOPHAGOGASTRODUODENOSCOPY N/A 06/01/2024   Procedure: EGD (ESOPHAGOGASTRODUODENOSCOPY);  Surgeon: Eartha Angelia, Sieving, MD;  Location: [facility];  Service: Gastroenterology;  Laterality: N/A;  12:00pm, ASA 1-2   POLYPECTOMY  03/17/2022   Procedure: POLYPECTOMY;  Surgeon: Eartha Angelia Sieving, MD;  Location: [facility];  Service: Gastroenterology;;    Allergies: Dramamine [dimenhydrinate] and Prednisone   Medications: Prior to Admission medications  Medication Sig Start Date End Date Taking? Authorizing Provider  acetaminophen  (TYLENOL ) 500 MG tablet Take 1,000 mg by mouth every 6 (six) hours as needed (pain.).   Yes [provider]  albuterol  (VENTOLIN  HFA) 108 (90 Base) MCG/ACT inhaler Inhale 1-2 puffs into the lungs every 4 (four) hours as needed for shortness of breath or wheezing. 03/28/24  Yes Darlean Ozell NOVAK, MD  alendronate (FOSAMAX) 70 MG tablet Take 70 mg by mouth once a week.   Yes [provider]  clonazePAM  (KLONOPIN ) 0.5 MG tablet Take 1 tablet (0.5 mg total) by mouth in the morning, at noon, and at bedtime. 03/24/24  Yes Ricky Fines, MD  cloNIDine  (CATAPRES  - DOSED IN MG/24 HR) 0.1 mg/24hr patch Place 1 patch (0.1 mg total) onto the skin once a week. 03/29/24  Yes Ricky Fines, MD  FLUoxetine (PROZAC) 10 MG capsule Take 10 mg by mouth in the morning.   Yes [provider]  fluticasone (FLONASE) 50 MCG/ACT nasal spray Place 2 sprays into both nostrils daily as needed for allergies or rhinitis. 09/30/23  Yes [provider]  hydrochlorothiazide (HYDRODIURIL) 25 MG tablet Take 25 mg by mouth daily.   Yes [provider]  linaclotide  (LINZESS ) 145 MCG CAPS capsule Take 1 capsule (145 mcg total) by mouth daily. 04/30/24  Yes Eartha Angelia Sieving, MD  metoprolol  succinate (TOPROL -XL) 50 MG 24 hr tablet Take 25 mg by mouth 2 (two) times daily. 12/08/23  Yes [provider]  omeprazole  (PRILOSEC) 40 MG capsule Take  1 capsule (40 mg total) by mouth daily. 04/30/24  Yes Eartha Angelia Sieving, MD  QUEtiapine  (SEROQUEL ) 400 MG tablet Take 1 tablet (400 mg total) by mouth at bedtime. 06/06/18  Yes Idol, Julie, PA-C  rosuvastatin (CRESTOR) 40 MG tablet Take 40 mg by mouth daily. 12/08/23  Yes [provider]  sertraline  (ZOLOFT ) 100 MG tablet Take 0.5 tablets (50 mg total) by mouth daily. Patient not taking: Reported on 12/03/2020 06/06/18 12/06/20  Idol, Julie, PA-C     Family History  Problem Relation Age of Onset   Colon polyps Mother    Colon cancer Maternal Grandmother    Colon cancer Maternal Grandfather    Colon cancer Paternal Grandmother    Colon cancer Paternal Grandfather    Colon cancer Paternal Uncle     Social History   Socioeconomic History   Marital status: Significant Other    Spouse name: Not on file   Number of children: Not on file   Years of education: Not on file   Highest education level: Not on file  Occupational History   Not on file  Tobacco Use   Smoking status: Every Day    Current packs/day: 1.00    Average packs/day: 1 pack/day for 40.0 years (40.0 ttl pk-yrs)    Types:  Cigarettes    Start date: 25    Passive exposure: Current   Smokeless tobacco: Never  Vaping Use   Vaping status: Never Used  Substance and Sexual Activity   Alcohol use: Not Currently   Drug use: Yes    Types: Marijuana    Comment: 1-2 times a month; last used 3weeks ago   Sexual activity: Yes    Comment: [email or web address]  Other Topics Concern   Not on file  Social History Narrative   Not on file   Social Drivers of Health   Tobacco Use: High Risk (06/27/2024)   Patient History    Smoking Tobacco Use: Every Day    Smokeless Tobacco Use: Never    Passive Exposure: Current  Financial Resource Strain: Not on file  Food Insecurity: No Food Insecurity (03/14/2024)   Epic    Worried About Programme Researcher, Broadcasting/film/video in the Last Year: Never true    Ran Out of Food in the Last Year: Never  true  Transportation Needs: No Transportation Needs (03/14/2024)   Epic    Lack of Transportation (Medical): No    Lack of Transportation (Non-Medical): No  Physical Activity: Not on file  Stress: Not on file  Social Connections: Not on file  Depression (EYV7-0): Not on file  Alcohol Screen: Not on file  Housing: High Risk (03/14/2024)   Epic    Unable to Pay for Housing in the Last Year: Yes    Number of Times Moved in the Last Year: 0    Homeless in the Last Year: No  Utilities: Not At Risk (03/14/2024)   Epic    Threatened with loss of utilities: No  Health Literacy: Not on file    Review of Systems  Respiratory:  Positive for shortness of breath.   Cardiovascular:  Positive for chest pain.    Vital Signs:  BP 96/64   Pulse 78   Ht 5' 5 (1.651 m)   Wt 145 lb (65.8 kg)   SpO2 100%   BMI 24.13 kg/m   Physical Exam CHEST: Clear to auscultation bilaterally, no wheezes. CARDIOVASCULAR: Normal heart sounds, pulses normal. MUSCULOSKELETAL: No tenderness on percussion of the spine.  Imaging:  Results Radiology Thoracic spine MRI (06/14/2024): Mild compression fractures of the superior endplate of T7, inferior endplate of T8, and superior endplate of T11 with mild edema; intervertebral discs within normal limits; no facet arthropathy (Independently interpreted)  Labs:  CBC: Recent Labs    03/15/24 2206 03/16/24 0931 03/18/24 0344 03/24/24 0431  WBC 20.1* 14.3* 7.0 6.3  HGB 16.6* 13.8 10.9* 11.9*  HCT 49.7* 40.6 34.5* 36.9  PLT 382 247 237 383    COAGS: No results for input(s): INR, APTT in the last 8760 hours.  BMP: Recent Labs    03/17/24 0354 03/18/24 0344 03/19/24 0434 03/23/24 0542 03/24/24 0431  NA 142 141 139  --  138  K 3.9 3.1* 3.6  --  3.8  CL 113* 109 110  --  103  CO2 21* 24 24  --  27  GLUCOSE 111* 106* 92  --  104*  BUN 8 7 5*  --  12  CALCIUM 8.1* 8.1* 8.0*  --  8.6*  CREATININE 0.55 0.63 0.66 0.68 0.74  GFRNONAA >60 >60 >60 >60  >60    LIVER FUNCTION TESTS: Recent Labs    03/14/24 0250 03/16/24 0931 03/18/24 0344 03/24/24 0431  BILITOT 0.7  --   --  0.4  AST 39  --   --  14*  ALT 19  --   --  26  ALKPHOS 78  --   --  80  PROT 6.9  --   --  5.7*  ALBUMIN 3.8 3.0* 2.8* 3.0*      Electronically Signed: Nancyann LULLA Burns  06/27/2024, 3:33 PM  I spent more than 45 minutes with review of her imaging studies, review of her previous cardiology studies, taking of the history, physical examination and consultation about treatment options.

## 2024-06-28 ENCOUNTER — Telehealth: Payer: Self-pay

## 2024-06-28 ENCOUNTER — Ambulatory Visit (HOSPITAL_COMMUNITY)

## 2024-06-28 ENCOUNTER — Other Ambulatory Visit: Payer: Self-pay

## 2024-06-28 ENCOUNTER — Encounter (HOSPITAL_COMMUNITY): Payer: Self-pay

## 2024-06-28 DIAGNOSIS — Z7409 Other reduced mobility: Secondary | ICD-10-CM | POA: Insufficient documentation

## 2024-06-28 DIAGNOSIS — M546 Pain in thoracic spine: Secondary | ICD-10-CM | POA: Diagnosis present

## 2024-06-28 DIAGNOSIS — G8929 Other chronic pain: Secondary | ICD-10-CM | POA: Diagnosis present

## 2024-06-28 NOTE — Therapy (Signed)
 OUTPATIENT PHYSICAL THERAPY THORACOLUMBAR EVALUATION   Patient Name: Sherry [name] MRN: [id number] DOB:1971-04-08, 53 y.o., female Today's Date: 06/28/2024  END OF SESSION:  PT End of Session - 06/28/24 1421     Visit Number 1    Date for Recertification  08/09/24    Authorization Type [place] MEDICAID [facility]    Authorization Time Period seeking auth    Progress Note Due on Visit 10    PT Start Time 1330    PT Stop Time 1403    PT Time Calculation (min) 33 min    Activity Tolerance Patient tolerated treatment well;Patient limited by pain    Behavior During Therapy WFL for tasks assessed/performed          Past Medical History:  Diagnosis Date   COPD (chronic obstructive pulmonary disease) (HCC)    Depression    High cholesterol    Hypertension    Lung collapse    left   Past Surgical History:  Procedure Laterality Date   CHEST TUBE INSERTION     COLONOSCOPY WITH PROPOFOL  N/A 03/17/2022   Procedure: COLONOSCOPY WITH PROPOFOL ;  Surgeon: Eartha Angelia Sieving, MD;  [facility];  Service: Gastroenterology;  Laterality: N/A;  930 ASA 2   ENDOMETRIAL ABLATION     ESOPHAGOGASTRODUODENOSCOPY N/A 06/01/2024   Procedure: EGD (ESOPHAGOGASTRODUODENOSCOPY);  Surgeon: Eartha Angelia, Sieving, MD;  Location: [facility];  Service: Gastroenterology;  Laterality: N/A;  12:00pm, ASA 1-2   POLYPECTOMY  03/17/2022   Procedure: POLYPECTOMY;  Surgeon: Eartha Angelia Sieving, MD;  Location: [facility];  Service: Gastroenterology;;   Patient Active Problem List   Diagnosis Date Noted   SIRS (systemic inflammatory response syndrome) (HCC) 03/14/2024   Hypokalemia 03/14/2024   Acute encephalopathy 03/14/2024   Acute metabolic encephalopathy 03/14/2024   COPD GOLD 2 02/07/2023   Sepsis (HCC) 02/05/2023   Sepsis due to pneumonia (HCC) 02/04/2023   Acute respiratory failure with hypoxia (HCC) 02/04/2023   Hypoalbuminemia due to protein-calorie malnutrition 02/04/2023    Thrombocytosis 02/04/2023   Hyponatremia 02/04/2023   Essential hypertension 02/04/2023   Shoulder pain, left 09/21/2022   Acute exacerbation of chronic obstructive pulmonary disease (COPD) (HCC) 09/21/2022   Cigarette smoker 09/21/2022   Recent unexplained weight loss 09/21/2022   Preventative health care 06/21/2019   GERD (gastroesophageal reflux disease) 06/21/2019   Constipation 06/21/2019    PCP: Halbert Mariano SQUIBB, DO   REFERRING PROVIDER: Darnella Dorn SAUNDERS, MD  REFERRING DIAG: S22.060A (ICD-10-CM) - Wedge compression fracture of T7-T8 vertebra, initial encounter for closed fracture  Rationale for Evaluation and Treatment: Rehabilitation  THERAPY DIAG:  Chronic bilateral thoracic back pain  Impaired functional mobility, balance, gait, and endurance  ONSET DATE: 6 months ago  SUBJECTIVE:  SUBJECTIVE STATEMENT: Pt states doctor said she had 3 fractures in 3 vertebrae, not sure when it happened thinks it was probably when she fell a couple times at work, [employer]. Pt states she is also having some spasm in right shoulder everyday, very annoying. Pt states there is a burning sensation in between the shoulder blades. Pt states she has bene out of pain meds for a couple of days. Pt states she was in the hospital 3 months ago after she passed out and hit toilet pretty hard. Pt states she is getting a back brace fitting next Monday.  PERTINENT HISTORY:  2 falls Hx of injections Osteoporosis, going through menopause Pt states she has chest pain but has been checked out for heart etc.  Anxiety COPD  PAIN:  Are you having pain? Yes: NPRS scale: 6/10 Pain location: mid back and shoulder blades Pain description: burning, sharp pain Aggravating factors: worse after carrying groceries and climbing  steps Relieving factors: laying on left side, pain medications  PRECAUTIONS: None  RED FLAGS: Chest pain, tests checked out on heart   WEIGHT BEARING RESTRICTIONS: No  FALLS:  Has patient fallen in last 6 months? Yes. Number of falls 1, tripped, twisted and landed on her butt  LIVING ENVIRONMENT: Lives with: lives with their spouse Lives in: House/apartment Stairs: Yes: External: 15 steps; on right going up Has following equipment at home: None  OCCUPATION: out of work for 6 months  PLOF: Independent and Independent with basic ADLs  PATIENT GOALS: decrease the back pain, increase overall mobility  NEXT MD VISIT: Jan or Feb  OBJECTIVE:  Note: Objective measures were completed at Evaluation unless otherwise noted.  DIAGNOSTIC FINDINGS:  CLINICAL DATA:  T7 compression fracture   EXAM: MRI THORACIC SPINE WITHOUT CONTRAST   TECHNIQUE: Multiplanar, multisequence MR imaging of the thoracic spine was performed. No intravenous contrast was administered.   COMPARISON:  Plain film 11/17/2025n   FINDINGS: Alignment: Normal   Bone marrow signal: There are mild compression fractures of the superior endplate of T7 in the inferior endplate of T8 with mild edema. There is a compression fracture of the superior endplate of T11 with mild edema with about 20% loss of vertebral height.   Thoracic spinal cord: Normal   Facet joints: No significant abnormality   Intervertebral discs: No significant abnormality   Paraspinal tissues: No significant abnormality   IMPRESSION: Acute or subacute benign compression fractures of the superior endplate of T7 and the inferior endplate of T8 with minimal loss of vertebral height and the superior endplate of T11 with about 20% loss of vertebral height.  PATIENT SURVEYS:  Modified Oswestry: 24 / 50 = 48.0 %     SENSATION: Light touch: Impaired , LUE N&T on medial 2 digits   POSTURE: rounded shoulders, forward head, and flexed trunk    PALPATION: Increased tenderness on spinous processes, T4-T11, bilateral paraspinals of above spinal segments and increased tone in bilateral upper traps.  THORACOLUMBAR ROM:   AROM eval  Flexion Pain with OP, WFL  Extension Most limited, most pain  Right lateral flexion Slight pull on left side of back, WFL  Left lateral flexion WFL  Right rotation Slight pain, WFL  Left rotation Slight pain, WFL   (Blank rows = not tested)  LOWER EXTREMITY ROM:     Active  Right eval Left eval  Hip flexion    Hip extension    Hip abduction    Hip adduction    Hip internal rotation  Hip external rotation    Knee flexion    Knee extension    Ankle dorsiflexion    Ankle plantarflexion    Ankle inversion    Ankle eversion     (Blank rows = not tested)  LOWER EXTREMITY MMT:  TBA  MMT Right eval Left eval  Hip flexion    Hip extension    Hip abduction    Hip adduction    Hip internal rotation    Hip external rotation    Knee flexion    Knee extension    Ankle dorsiflexion    Ankle plantarflexion    Ankle inversion    Ankle eversion     (Blank rows = not tested)   FUNCTIONAL TESTS:  5 times sit to stand: 13.66, no increased pain 2 minute walk test: 336 feet, SOB noted    SLS 06/28/24: R: 2.3 seconds L: 1.7 seconds  GAIT: Distance walked: 440 feet Assistive device utilized: None Level of assistance: Complete Independence Comments: Pt demonstrates increased lateral trunk sway and antalgic pattern with decreased rotation of thoracic and lumbar spinal segments.   TREATMENT DATE:  06/28/2024   Evaluation: -ROM measured, Strength assessed, HEP prescribed, pt educated on prognosis, findings, and importance of HEP compliance if given.                                                                                                                                      PATIENT EDUCATION:  Education details: Pt was educated on findings of PT evaluation, prognosis,  frequency of therapy visits and rationale, attendance policy, and HEP if given.   Person educated: Patient Education method: Explanation, Verbal cues, and Handouts Education comprehension: verbalized understanding, tactile cues required, and needs further education  HOME EXERCISE PROGRAM: Access Code: [email or web address] URL: [email or web address] Date: 06/28/2024 Prepared by: Lang Ada  Exercises - Seated Scapular Retraction  - 1 x daily - 7 x weekly - 3 sets - 10 reps - Sidelying Open Book Thoracic Lumbar Rotation and Extension  - 1 x daily - 7 x weekly - 3 sets - 10 reps - Prone Press Up On Elbows  - 1 x daily - 7 x weekly - 1 sets - 3 reps - 30 hold  ASSESSMENT:  CLINICAL IMPRESSION: Patient is a 53 y.o. female who was seen today for physical therapy evaluation and treatment for S22.060A (ICD-10-CM) - Wedge compression fracture of T7-T8 vertebra, initial encounter for closed fracture.   Patient demonstrates increased mid back and scapular pain, decreased postural/core strength, abnormal pain with thoracic spine mobility, and impaired balance. Patient also demonstrates difficulty with ambulation during today's session with decreased thoracic rotations, decreased stride length and velocity noted. Patient also demonstrates abnormal posture with forward shoulders and head, pt defaults to this posture at baseline. Patient requires education on proper posture, role of PT, importance of HEP compliance and overall POC. Patient would benefit from skilled physical  therapy for decreased mid back pain, increased endurance with ambulation, increased postural and core strength, and balance for improved gait quality, return to higher level of function with ADLs, and progress towards therapy goals.   OBJECTIVE IMPAIRMENTS: Abnormal gait, cardiopulmonary status limiting activity, decreased activity tolerance, decreased balance, decreased endurance, decreased knowledge of condition, decreased  knowledge of use of DME, decreased mobility, difficulty walking, decreased ROM, decreased strength, and pain.   ACTIVITY LIMITATIONS: carrying, lifting, bending, sitting, standing, squatting, sleeping, stairs, transfers, and bed mobility  PARTICIPATION LIMITATIONS: meal prep, cleaning, laundry, driving, shopping, community activity, occupation, and yard work  PERSONAL FACTORS: Age, Fitness, Past/current experiences, Time since onset of injury/illness/exacerbation, and 1 comorbidity: COPD are also affecting patient's functional outcome.   REHAB POTENTIAL: Fair chronic in nature  CLINICAL DECISION MAKING: Stable/uncomplicated  EVALUATION COMPLEXITY: Low   GOALS: Goals reviewed with patient? No  SHORT TERM GOALS: Target date: 07/19/24  Pt will be independent with HEP in order to demonstrate participation in Physical Therapy POC.  Baseline: Goal status: INITIAL  2.  Pt will report 4/10 pain with mobility in order to demonstrate improved pain with ADLs.  Baseline: 6/10 Goal status: INITIAL  LONG TERM GOALS: Target date: 08/09/24  Pt will improve SLS by 6 seconds in order to demonstrate improved functional balance to return to desired activities.  Baseline: see objective.  Goal status: INITIAL  2.  Pt will improve 2 MWT by 40 feet with improved gait pattern in order to demonstrate improved functional ambulatory capacity in community setting.  Baseline: see objective.  Goal status: INITIAL  3.  Pt will improve Modified Oswestry score by at least 6 points in order to demonstrate improved pain with functional goals and outcomes. Baseline: see objective.  Goal status: INITIAL  4.  Pt will report 3/10 pain with mobility in order to demonstrate reduced pain with ADLs lasting greater than 30 minutes.  Baseline: see objective.  Goal status: INITIAL   PLAN:  PT FREQUENCY: 1-2x/week  PT DURATION: 6 weeks  PLANNED INTERVENTIONS: 97110-Therapeutic exercises, 97530- Therapeutic  activity, 97112- Neuromuscular re-education, 97535- Self Care, [id number]- Manual therapy, [phone]- Gait training, [phone]- Electrical stimulation (unattended), [phone]- Electrical stimulation (manual), [phone] (1-2 muscles), [phone] (3+ muscles)- Dry Needling, Patient/Family education, Balance training, Stair training, Taping, Joint mobilization, Joint manipulation, Spinal manipulation, Spinal mobilization, DME instructions, Cryotherapy, and Moist heat.  PLAN FOR NEXT SESSION: review goals and HEP, progress postural and core strengthening, COPD pt, progress thoracic spine mobility, assess UE/LE strength   Lang Ada, PT, DPT [facility] Office: [phone] 2:23 PM, 06/28/2024   Managed Medicaid Authorization Request Treatment Start Date: July 21, 2024  Visit Dx Codes: M54.6; G89.29; Z74.09  Functional Tool Score: Modified Oswestry: 24 / 50 = 48.0 %  For all possible CPT codes, reference the Planned Interventions line above.     Check all conditions that are expected to impact treatment: {Conditions expected to impact treatment:Structural or anatomic abnormalities and Unknown   If treatment provided at initial evaluation, no treatment charged due to lack of authorization.

## 2024-06-28 NOTE — Telephone Encounter (Signed)
 I called the patient to inform her that I had faxed an order to [facility] for her TLSO brace. She is going to call me if she has not heard from them by Monday.   I sent our front desk a message to get her set up for an appointment in 4-6 weeks per Dr. Lester. Order form will also be routed to her media for documentation.

## 2024-07-03 ENCOUNTER — Ambulatory Visit (HOSPITAL_COMMUNITY): Admitting: Physical Therapy

## 2024-07-03 DIAGNOSIS — Z7409 Other reduced mobility: Secondary | ICD-10-CM

## 2024-07-03 DIAGNOSIS — M546 Pain in thoracic spine: Secondary | ICD-10-CM | POA: Diagnosis not present

## 2024-07-03 DIAGNOSIS — G8929 Other chronic pain: Secondary | ICD-10-CM

## 2024-07-03 NOTE — Therapy (Signed)
 " OUTPATIENT PHYSICAL THERAPY THORACOLUMBAR TREATMENT   Patient Name: Sherry [name] MRN: [id number] DOB:Nov 28, 1970, 53 y.o., female Today's Date: 07/03/2024  END OF SESSION:  PT End of Session - 07/03/24 0847     Visit Number 2    Number of Visits 8    Date for Recertification  08/09/24    Authorization Type [place] MEDICAID [facility]    Authorization Time Period 8 visits approved 12/18-2/16/26    Authorization - Visit Number 1    Authorization - Number of Visits 8    Progress Note Due on Visit 10    PT Start Time 0850    PT Stop Time 0930    PT Time Calculation (min) 40 min    Activity Tolerance Patient tolerated treatment well;Patient limited by pain    Behavior During Therapy WFL for tasks assessed/performed          Past Medical History:  Diagnosis Date   COPD (chronic obstructive pulmonary disease) (HCC)    Depression    High cholesterol    Hypertension    Lung collapse    left   Past Surgical History:  Procedure Laterality Date   CHEST TUBE INSERTION     COLONOSCOPY WITH PROPOFOL  N/A 03/17/2022   Procedure: COLONOSCOPY WITH PROPOFOL ;  Surgeon: Eartha Angelia Sieving, MD;  [facility];  Service: Gastroenterology;  Laterality: N/A;  930 ASA 2   ENDOMETRIAL ABLATION     ESOPHAGOGASTRODUODENOSCOPY N/A 06/01/2024   Procedure: EGD (ESOPHAGOGASTRODUODENOSCOPY);  Surgeon: Eartha Angelia, Sieving, MD;  Location: [facility];  Service: Gastroenterology;  Laterality: N/A;  12:00pm, ASA 1-2   POLYPECTOMY  03/17/2022   Procedure: POLYPECTOMY;  Surgeon: Eartha Angelia Sieving, MD;  Location: [facility];  Service: Gastroenterology;;   Patient Active Problem List   Diagnosis Date Noted   SIRS (systemic inflammatory response syndrome) (HCC) 03/14/2024   Hypokalemia 03/14/2024   Acute encephalopathy 03/14/2024   Acute metabolic encephalopathy 03/14/2024   COPD GOLD 2 02/07/2023   Sepsis (HCC) 02/05/2023   Sepsis due to pneumonia (HCC) 02/04/2023   Acute  respiratory failure with hypoxia (HCC) 02/04/2023   Hypoalbuminemia due to protein-calorie malnutrition 02/04/2023   Thrombocytosis 02/04/2023   Hyponatremia 02/04/2023   Essential hypertension 02/04/2023   Shoulder pain, left 09/21/2022   Acute exacerbation of chronic obstructive pulmonary disease (COPD) (HCC) 09/21/2022   Cigarette smoker 09/21/2022   Recent unexplained weight loss 09/21/2022   Preventative health care 06/21/2019   GERD (gastroesophageal reflux disease) 06/21/2019   Constipation 06/21/2019    PCP: Halbert Mariano SQUIBB, DO   REFERRING PROVIDER: Darnella Dorn SAUNDERS, MD  REFERRING DIAG: S22.060A (ICD-10-CM) - Wedge compression fracture of T7-T8 vertebra, initial encounter for closed fracture  Rationale for Evaluation and Treatment: Rehabilitation  THERAPY DIAG:  Chronic bilateral thoracic back pain  Impaired functional mobility, balance, gait, and endurance  ONSET DATE: 6 months ago  SUBJECTIVE:  SUBJECTIVE STATEMENT: Pt returns today wearing a CASH brace.  Reports pain at 4/10 pain currently, no longer working X past 6 months.  Admits to not doing her HEP since evaluation.   Evaluation: Pt states doctor said she had 3 fractures in 3 vertebrae, not sure when it happened thinks it was probably when she fell a couple times at work, [employer]. Pt states she is also having some spasm in right shoulder everyday, very annoying. Pt states there is a burning sensation in between the shoulder blades. Pt states she has bene out of pain meds for a couple of days. Pt states she was in the hospital 3 months ago after she passed out and hit toilet pretty hard. Pt states she is getting a back brace fitting next Monday.  PERTINENT HISTORY:  2 falls Hx of injections Osteoporosis, going through  menopause Pt states she has chest pain but has been checked out for heart etc.  Anxiety COPD  PAIN:  Are you having pain? Yes: NPRS scale: 4/10 Pain location: mid back and shoulder blades Pain description: burning, sharp pain Aggravating factors: worse after carrying groceries and climbing steps Relieving factors: laying on left side, pain medications  PRECAUTIONS: None  RED FLAGS: Chest pain, tests checked out on heart   WEIGHT BEARING RESTRICTIONS: No  FALLS:  Has patient fallen in last 6 months? Yes. Number of falls 1, tripped, twisted and landed on her butt  LIVING ENVIRONMENT: Lives with: lives with their spouse Lives in: House/apartment Stairs: Yes: External: 15 steps; on right going up Has following equipment at home: None  OCCUPATION: out of work for 6 months  PLOF: Independent and Independent with basic ADLs  PATIENT GOALS: decrease the back pain, increase overall mobility  NEXT MD VISIT: Jan or Feb  OBJECTIVE:  Note: Objective measures were completed at Evaluation unless otherwise noted.  DIAGNOSTIC FINDINGS:  CLINICAL DATA:  T7 compression fracture   EXAM: MRI THORACIC SPINE WITHOUT CONTRAST   TECHNIQUE: Multiplanar, multisequence MR imaging of the thoracic spine was performed. No intravenous contrast was administered.   COMPARISON:  Plain film 11/17/2025n   FINDINGS: Alignment: Normal   Bone marrow signal: There are mild compression fractures of the superior endplate of T7 in the inferior endplate of T8 with mild edema. There is a compression fracture of the superior endplate of T11 with mild edema with about 20% loss of vertebral height.   Thoracic spinal cord: Normal   Facet joints: No significant abnormality   Intervertebral discs: No significant abnormality   Paraspinal tissues: No significant abnormality   IMPRESSION: Acute or subacute benign compression fractures of the superior endplate of T7 and the inferior endplate of T8 with  minimal loss of vertebral height and the superior endplate of T11 with about 20% loss of vertebral height.  PATIENT SURVEYS:  Modified Oswestry: 24 / 50 = 48.0 %     SENSATION: Light touch: Impaired , LUE N&T on medial 2 digits   POSTURE: rounded shoulders, forward head, and flexed trunk   PALPATION: Increased tenderness on spinous processes, T4-T11, bilateral paraspinals of above spinal segments and increased tone in bilateral upper traps.  THORACOLUMBAR ROM:   AROM eval  Flexion Pain with OP, WFL  Extension Most limited, most pain  Right lateral flexion Slight pull on left side of back, WFL  Left lateral flexion WFL  Right rotation Slight pain, WFL  Left rotation Slight pain, WFL   (Blank rows = not tested)  FUNCTIONAL TESTS:  5  times sit to stand: 13.66, no increased pain 2 minute walk test: 336 feet, SOB noted    SLS 06/28/24: R: 2.3 seconds L: 1.7 seconds  GAIT: Distance walked: 440 feet Assistive device utilized: None Level of assistance: Complete Independence Comments: Pt demonstrates increased lateral trunk sway and antalgic pattern with decreased rotation of thoracic and lumbar spinal segments.   TREATMENT DATE:  07/03/24 Goal review Seated:  scapular Retraction 10X5  Thoracic excursions with UE movements 5X each direction/UE Supine: Abdominal isometrics 10X5  Bridge 10X3  SLR with core engagement 10X each Sit to stands eccentric lowering 10X no UE Standing lumbar extension 5X   06/28/2024  Evaluation: -ROM measured, Strength assessed, HEP prescribed, pt educated on prognosis, findings, and importance of HEP compliance if given.                                                                                                                                 PATIENT EDUCATION:  Education details: Pt was educated on findings of PT evaluation, prognosis, frequency of therapy visits and rationale, attendance policy, and HEP if given.   Person educated:  Patient Education method: Explanation, Verbal cues, and Handouts Education comprehension: verbalized understanding, tactile cues required, and needs further education  HOME EXERCISE PROGRAM: Access Code: [email or web address] URL: [email or web address] Date: 06/28/2024 Prepared by: Lang Ada Exercises - Seated Scapular Retraction  - 1 x daily - 7 x weekly - 3 sets - 10 reps - Sidelying Open Book Thoracic Lumbar Rotation and Extension  - 1 x daily - 7 x weekly - 3 sets - 10 reps - Prone Press Up On Elbows  - 1 x daily - 7 x weekly - 1 sets - 3 reps - 30 hold  Access Code: [email or web address] URL: [email or web address] Date: 07/03/2024 Prepared by: Greig Fuse - Supine Transversus Abdominis Bracing - Hands on Stomach  - 2 x daily - 7 x weekly - 10 reps - 5 sec hold - Supine Active Straight Leg Raise  - 2 x daily - 7 x weekly - 10 reps - Sit to Stand  - 2 x daily - 7 x weekly - 10 reps Thoracic excursions with UE movements 5X each direction  ASSESSMENT:  CLINICAL IMPRESSION: Reviewed goals and POC moving forward.  Discussed importance of posture and compliance with HEP for best results.  Reviewed HEP with pt requiring cues for general form. Pt admits to sitting to hard due to LE weakness so added controlled sit to stands.  Thoracic excursions added to work on upper spinal mobility and educated on importance of stabilizing core with all exercises/activities.  Updated HEP to include these as well.  Patient will continue to benefit from skilled physical therapy for decreased mid back pain, increased endurance with ambulation, increased postural and core strength, and balance for improved gait quality, return to higher level of function with ADLs, and progress towards therapy goals.   OBJECTIVE IMPAIRMENTS: Abnormal gait, cardiopulmonary status  limiting activity, decreased activity tolerance, decreased balance, decreased endurance, decreased knowledge of condition, decreased knowledge of  use of DME, decreased mobility, difficulty walking, decreased ROM, decreased strength, and pain.   ACTIVITY LIMITATIONS: carrying, lifting, bending, sitting, standing, squatting, sleeping, stairs, transfers, and bed mobility  PARTICIPATION LIMITATIONS: meal prep, cleaning, laundry, driving, shopping, community activity, occupation, and yard work  PERSONAL FACTORS: Age, Fitness, Past/current experiences, Time since onset of injury/illness/exacerbation, and 1 comorbidity: COPD are also affecting patient's functional outcome.   REHAB POTENTIAL: Fair chronic in nature  CLINICAL DECISION MAKING: Stable/uncomplicated  EVALUATION COMPLEXITY: Low   GOALS: Goals reviewed with patient? Yes  SHORT TERM GOALS: Target date: 07/19/24  Pt will be independent with HEP in order to demonstrate participation in Physical Therapy POC.  Baseline: Goal status: IN PROGRESS  2.  Pt will report 4/10 pain with mobility in order to demonstrate improved pain with ADLs.  Baseline: 6/10 Goal status: IN PROGRESS  LONG TERM GOALS: Target date: 08/09/24  Pt will improve SLS by 6 seconds in order to demonstrate improved functional balance to return to desired activities.  Baseline: see objective.  Goal status: IN PROGRESS  2.  Pt will improve 2 MWT by 40 feet with improved gait pattern in order to demonstrate improved functional ambulatory capacity in community setting.  Baseline: see objective.  Goal status: IN PROGRESS  3.  Pt will improve Modified Oswestry score by at least 6 points in order to demonstrate improved pain with functional goals and outcomes. Baseline: see objective.  Goal status: IN PROGRESS  4.  Pt will report 3/10 pain with mobility in order to demonstrate reduced pain with ADLs lasting greater than 30 minutes.  Baseline: see objective.  Goal status: IN PROGRESS   PLAN:  PT FREQUENCY: 1-2x/week  PT DURATION: 6 weeks  PLANNED INTERVENTIONS: 97110-Therapeutic exercises, 97530-  Therapeutic activity, 97112- Neuromuscular re-education, 97535- Self Care, [id number]- Manual therapy, [phone]- Gait training, [phone]- Electrical stimulation (unattended), [phone]- Electrical stimulation (manual), [phone] (1-2 muscles), [phone] (3+ muscles)- Dry Needling, Patient/Family education, Balance training, Stair training, Taping, Joint mobilization, Joint manipulation, Spinal manipulation, Spinal mobilization, DME instructions, Cryotherapy, and Moist heat.  PLAN FOR NEXT SESSION:progress postural and core strengthening, COPD pt, progress thoracic spine mobility, assess UE/LE strength. Next session begin postural and core theraband activities.   Greig KATHEE Fuse, PTA/CLT [facility] Ph: [phone] 8:49 AM, 07/03/2024    "

## 2024-07-09 ENCOUNTER — Ambulatory Visit (HOSPITAL_COMMUNITY)

## 2024-07-17 ENCOUNTER — Ambulatory Visit (HOSPITAL_COMMUNITY): Admitting: Physical Therapy

## 2024-07-17 DIAGNOSIS — M546 Pain in thoracic spine: Secondary | ICD-10-CM | POA: Diagnosis present

## 2024-07-17 DIAGNOSIS — Z7409 Other reduced mobility: Secondary | ICD-10-CM | POA: Insufficient documentation

## 2024-07-17 DIAGNOSIS — G8929 Other chronic pain: Secondary | ICD-10-CM | POA: Diagnosis present

## 2024-07-17 NOTE — Therapy (Signed)
 " OUTPATIENT PHYSICAL THERAPY THORACOLUMBAR TREATMENT   Patient Name: Sherry [name] MRN: [id number] DOB:01/19/1971, 54 y.o., female Today's Date: 07/17/2024  END OF SESSION:  PT End of Session - 07/17/24 0850     Visit Number 3    Number of Visits 8    Date for Recertification  08/09/24    Authorization Type [place] MEDICAID [facility]    Authorization Time Period 8 visits approved 12/18-2/16/26    Authorization - Visit Number 2    Authorization - Number of Visits 8    Progress Note Due on Visit 10    PT Start Time 0816    PT Stop Time 0855    PT Time Calculation (min) 39 min    Activity Tolerance Patient tolerated treatment well;Patient limited by pain    Behavior During Therapy [facility] for tasks assessed/performed           Past Medical History:  Diagnosis Date   COPD (chronic obstructive pulmonary disease) (HCC)    Depression    High cholesterol    Hypertension    Lung collapse    left   Past Surgical History:  Procedure Laterality Date   CHEST TUBE INSERTION     COLONOSCOPY WITH PROPOFOL  N/A 03/17/2022   Procedure: COLONOSCOPY WITH PROPOFOL ;  Surgeon: Eartha Angelia Sieving, MD;  [facility];  Service: Gastroenterology;  Laterality: N/A;  930 ASA 2   ENDOMETRIAL ABLATION     ESOPHAGOGASTRODUODENOSCOPY N/A 06/01/2024   Procedure: EGD (ESOPHAGOGASTRODUODENOSCOPY);  Surgeon: Eartha Angelia, Sieving, MD;  Location: [facility];  Service: Gastroenterology;  Laterality: N/A;  12:00pm, ASA 1-2   POLYPECTOMY  03/17/2022   Procedure: POLYPECTOMY;  Surgeon: Eartha Angelia Sieving, MD;  Location: [facility];  Service: Gastroenterology;;   Patient Active Problem List   Diagnosis Date Noted   SIRS (systemic inflammatory response syndrome) (HCC) 03/14/2024   Hypokalemia 03/14/2024   Acute encephalopathy 03/14/2024   Acute metabolic encephalopathy 03/14/2024   COPD GOLD 2 02/07/2023   Sepsis (HCC) 02/05/2023   Sepsis due to pneumonia (HCC) 02/04/2023   Acute  respiratory failure with hypoxia (HCC) 02/04/2023   Hypoalbuminemia due to protein-calorie malnutrition 02/04/2023   Thrombocytosis 02/04/2023   Hyponatremia 02/04/2023   Essential hypertension 02/04/2023   Shoulder pain, left 09/21/2022   Acute exacerbation of chronic obstructive pulmonary disease (COPD) (HCC) 09/21/2022   Cigarette smoker 09/21/2022   Recent unexplained weight loss 09/21/2022   Preventative health care 06/21/2019   GERD (gastroesophageal reflux disease) 06/21/2019   Constipation 06/21/2019    PCP: Halbert Mariano SQUIBB, DO   REFERRING PROVIDER: Darnella Dorn SAUNDERS, MD  REFERRING DIAG: S22.060A (ICD-10-CM) - Wedge compression fracture of T7-T8 vertebra, initial encounter for closed fracture  Rationale for Evaluation and Treatment: Rehabilitation  THERAPY DIAG:  Chronic bilateral thoracic back pain  Impaired functional mobility, balance, gait, and endurance  ONSET DATE: 6 months ago  SUBJECTIVE:  SUBJECTIVE STATEMENT: Pt states she wears her CASH brace when she's up moving around.  Pt did not wear it to therapy today.  Reports pain at 3/10 pain currently.  Still not doing her HEP as much as she should.    Evaluation: Pt states doctor said she had 3 fractures in 3 vertebrae, not sure when it happened thinks it was probably when she fell a couple times at work, [employer]. Pt states she is also having some spasm in right shoulder everyday, very annoying. Pt states there is a burning sensation in between the shoulder blades. Pt states she has bene out of pain meds for a couple of days. Pt states she was in the hospital 3 months ago after she passed out and hit toilet pretty hard. Pt states she is getting a back brace fitting next Monday.  PERTINENT HISTORY:  2 falls Hx of  injections Osteoporosis, going through menopause Pt states she has chest pain but has been checked out for heart etc.  Anxiety COPD  PAIN:  Are you having pain? Yes: NPRS scale: 4/10 Pain location: mid back and shoulder blades Pain description: burning, sharp pain Aggravating factors: worse after carrying groceries and climbing steps Relieving factors: laying on left side, pain medications  PRECAUTIONS: None  RED FLAGS: Chest pain, tests checked out on heart   WEIGHT BEARING RESTRICTIONS: No  FALLS:  Has patient fallen in last 6 months? Yes. Number of falls 1, tripped, twisted and landed on her butt  LIVING ENVIRONMENT: Lives with: lives with their spouse Lives in: House/apartment Stairs: Yes: External: 15 steps; on right going up Has following equipment at home: None  OCCUPATION: out of work for 6 months  PLOF: Independent and Independent with basic ADLs  PATIENT GOALS: decrease the back pain, increase overall mobility  NEXT MD VISIT: Jan or Feb  OBJECTIVE:  Note: Objective measures were completed at Evaluation unless otherwise noted.  DIAGNOSTIC FINDINGS:  CLINICAL DATA:  T7 compression fracture   EXAM: MRI THORACIC SPINE WITHOUT CONTRAST   TECHNIQUE: Multiplanar, multisequence MR imaging of the thoracic spine was performed. No intravenous contrast was administered.   COMPARISON:  Plain film 11/17/2025n   FINDINGS: Alignment: Normal   Bone marrow signal: There are mild compression fractures of the superior endplate of T7 in the inferior endplate of T8 with mild edema. There is a compression fracture of the superior endplate of T11 with mild edema with about 20% loss of vertebral height.   Thoracic spinal cord: Normal   Facet joints: No significant abnormality   Intervertebral discs: No significant abnormality   Paraspinal tissues: No significant abnormality   IMPRESSION: Acute or subacute benign compression fractures of the superior endplate of  T7 and the inferior endplate of T8 with minimal loss of vertebral height and the superior endplate of T11 with about 20% loss of vertebral height.  PATIENT SURVEYS:  Modified Oswestry: 24 / 50 = 48.0 %     SENSATION: Light touch: Impaired , LUE N&T on medial 2 digits   POSTURE: rounded shoulders, forward head, and flexed trunk   PALPATION: Increased tenderness on spinous processes, T4-T11, bilateral paraspinals of above spinal segments and increased tone in bilateral upper traps.  THORACOLUMBAR ROM:   AROM eval  Flexion Pain with OP, WFL  Extension Most limited, most pain  Right lateral flexion Slight pull on left side of back, WFL  Left lateral flexion WFL  Right rotation Slight pain, WFL  Left rotation Slight pain, WFL   (  Blank rows = not tested)  FUNCTIONAL TESTS:  5 times sit to stand: 13.66, no increased pain 2 minute walk test: 336 feet, SOB noted    SLS 06/28/24: R: 2.3 seconds L: 1.7 seconds  GAIT: Distance walked: 440 feet Assistive device utilized: None Level of assistance: Complete Independence Comments: Pt demonstrates increased lateral trunk sway and antalgic pattern with decreased rotation of thoracic and lumbar spinal segments.   TREATMENT DATE:  07/17/24 Nustep UE/LE 5 minutes level 4 seat 8 Standing:  lumbar extension 10X  RTB shoulder extensions 10X2  RTB scapular retractions 10X  RTB pallof each direction 10X each  Seated:  scapular Retraction 10X5  Thoracic excursions with UE movements 5X each direction/UE Supine: decompression exercises 1-5, 5X each Abdominal isometrics 10X5  Bridge 10X3  SLR with core engagement 10X each  07/03/24 Goal review Seated:  scapular Retraction 10X5  Thoracic excursions with UE movements 5X each direction/UE Supine: Abdominal isometrics 10X5  Bridge 10X3  SLR with core engagement 10X each Sit to stands eccentric lowering 10X no UE Standing lumbar extension 5X   06/28/2024  Evaluation: -ROM measured,  Strength assessed, HEP prescribed, pt educated on prognosis, findings, and importance of HEP compliance if given.                                                                                                                                 PATIENT EDUCATION:  Education details: Pt was educated on findings of PT evaluation, prognosis, frequency of therapy visits and rationale, attendance policy, and HEP if given.   Person educated: Patient Education method: Explanation, Verbal cues, and Handouts Education comprehension: verbalized understanding, tactile cues required, and needs further education  HOME EXERCISE PROGRAM: Access Code: [email or web address] URL: [email or web address] Date: 06/28/2024 Prepared by: Lang Ada Exercises - Seated Scapular Retraction  - 1 x daily - 7 x weekly - 3 sets - 10 reps - Sidelying Open Book Thoracic Lumbar Rotation and Extension  - 1 x daily - 7 x weekly - 3 sets - 10 reps - Prone Press Up On Elbows  - 1 x daily - 7 x weekly - 1 sets - 3 reps - 30 hold  Access Code: [email or web address] URL: [email or web address] Date: 07/03/2024 Prepared by: Greig Fuse - Supine Transversus Abdominis Bracing - Hands on Stomach  - 2 x daily - 7 x weekly - 10 reps - 5 sec hold - Supine Active Straight Leg Raise  - 2 x daily - 7 x weekly - 10 reps - Sit to Stand  - 2 x daily - 7 x weekly - 10 reps Thoracic excursions with UE movements 5X each direction  ASSESSMENT:  CLINICAL IMPRESSION: Began session with warm up on nustep and progressed on to postural strengthening.  Pallof also added to address weak core.  Pt with general issues with form completing these and required cues to complete slowly/controlled.  Pt with some pain and cramping during session due to  weakness.  Educated on importance of being compliant with HEP as this will improve as she gets stronger.  Pt with questions regarding crunches and instructed not do do these at this time and work more on  isometric core engagement.  No new exercises added today as currently has a solid HEP.  Patient will continue to benefit from skilled physical therapy for decreased mid back pain, increased endurance with ambulation, increased postural and core strength, and balance for improved gait quality, return to higher level of function with ADLs, and progress towards therapy goals.   OBJECTIVE IMPAIRMENTS: Abnormal gait, cardiopulmonary status limiting activity, decreased activity tolerance, decreased balance, decreased endurance, decreased knowledge of condition, decreased knowledge of use of DME, decreased mobility, difficulty walking, decreased ROM, decreased strength, and pain.   ACTIVITY LIMITATIONS: carrying, lifting, bending, sitting, standing, squatting, sleeping, stairs, transfers, and bed mobility  PARTICIPATION LIMITATIONS: meal prep, cleaning, laundry, driving, shopping, community activity, occupation, and yard work  PERSONAL FACTORS: Age, Fitness, Past/current experiences, Time since onset of injury/illness/exacerbation, and 1 comorbidity: COPD are also affecting patient's functional outcome.   REHAB POTENTIAL: Fair chronic in nature  CLINICAL DECISION MAKING: Stable/uncomplicated  EVALUATION COMPLEXITY: Low   GOALS: Goals reviewed with patient? Yes  SHORT TERM GOALS: Target date: 07/19/24  Pt will be independent with HEP in order to demonstrate participation in Physical Therapy POC.  Baseline: Goal status: IN PROGRESS  2.  Pt will report 4/10 pain with mobility in order to demonstrate improved pain with ADLs.  Baseline: 6/10 Goal status: IN PROGRESS  LONG TERM GOALS: Target date: 08/09/24  Pt will improve SLS by 6 seconds in order to demonstrate improved functional balance to return to desired activities.  Baseline: see objective.  Goal status: IN PROGRESS  2.  Pt will improve 2 MWT by 40 feet with improved gait pattern in order to demonstrate improved functional ambulatory  capacity in community setting.  Baseline: see objective.  Goal status: IN PROGRESS  3.  Pt will improve Modified Oswestry score by at least 6 points in order to demonstrate improved pain with functional goals and outcomes. Baseline: see objective.  Goal status: IN PROGRESS  4.  Pt will report 3/10 pain with mobility in order to demonstrate reduced pain with ADLs lasting greater than 30 minutes.  Baseline: see objective.  Goal status: IN PROGRESS   PLAN:  PT FREQUENCY: 1-2x/week  PT DURATION: 6 weeks  PLANNED INTERVENTIONS: 97110-Therapeutic exercises, 97530- Therapeutic activity, 97112- Neuromuscular re-education, 97535- Self Care, [id number]- Manual therapy, [phone]- Gait training, [phone]- Electrical stimulation (unattended), [phone]- Electrical stimulation (manual), [phone] (1-2 muscles), [phone] (3+ muscles)- Dry Needling, Patient/Family education, Balance training, Stair training, Taping, Joint mobilization, Joint manipulation, Spinal manipulation, Spinal mobilization, DME instructions, Cryotherapy, and Moist heat.  PLAN FOR NEXT SESSION:  progress postural and core strengthening, COPD pt, progress thoracic spine mobility, assess UE/LE strength. Progress core.   Greig KATHEE Fuse, PTA/CLT [facility] Ph: [phone] 8:51 AM, 07/17/2024    "

## 2024-07-18 ENCOUNTER — Ambulatory Visit: Admitting: Cardiology

## 2024-07-19 ENCOUNTER — Encounter (HOSPITAL_COMMUNITY): Payer: Self-pay

## 2024-07-19 ENCOUNTER — Ambulatory Visit (HOSPITAL_COMMUNITY)

## 2024-07-19 DIAGNOSIS — Z7409 Other reduced mobility: Secondary | ICD-10-CM

## 2024-07-19 DIAGNOSIS — M546 Pain in thoracic spine: Secondary | ICD-10-CM | POA: Diagnosis not present

## 2024-07-19 DIAGNOSIS — G8929 Other chronic pain: Secondary | ICD-10-CM

## 2024-07-19 NOTE — Therapy (Signed)
 " OUTPATIENT PHYSICAL THERAPY THORACOLUMBAR TREATMENT   Patient Name: Sherry [name] MRN: [id number] DOB:1970-12-11, 54 y.o., female Today's Date: 07/19/2024  END OF SESSION:  PT End of Session - 07/19/24 0903     Visit Number 4    Number of Visits 8    Date for Recertification  08/09/24    Authorization Type [place] MEDICAID [facility]    Authorization Time Period 8 visits approved 12/18-2/16/26    Authorization - Visit Number 3    Authorization - Number of Visits 8    Progress Note Due on Visit 10    PT Start Time 0903    PT Stop Time 0942    PT Time Calculation (min) 39 min    Activity Tolerance Patient tolerated treatment well;Patient limited by pain    Behavior During Therapy [facility] for tasks assessed/performed           Past Medical History:  Diagnosis Date   COPD (chronic obstructive pulmonary disease) (HCC)    Depression    High cholesterol    Hypertension    Lung collapse    left   Past Surgical History:  Procedure Laterality Date   CHEST TUBE INSERTION     COLONOSCOPY WITH PROPOFOL  N/A 03/17/2022   Procedure: COLONOSCOPY WITH PROPOFOL ;  Surgeon: Eartha Angelia Sieving, MD;  [facility]: [facility];  Service: Gastroenterology;  Laterality: N/A;  930 ASA 2   ENDOMETRIAL ABLATION     ESOPHAGOGASTRODUODENOSCOPY N/A 06/01/2024   Procedure: EGD (ESOPHAGOGASTRODUODENOSCOPY);  Surgeon: Eartha Angelia, Sieving, MD;  Location: [facility];  Service: Gastroenterology;  Laterality: N/A;  12:00pm, ASA 1-2   POLYPECTOMY  03/17/2022   Procedure: POLYPECTOMY;  Surgeon: Eartha Angelia Sieving, MD;  Location: [facility];  Service: Gastroenterology;;   Patient Active Problem List   Diagnosis Date Noted   SIRS (systemic inflammatory response syndrome) (HCC) 03/14/2024   Hypokalemia 03/14/2024   Acute encephalopathy 03/14/2024   Acute metabolic encephalopathy 03/14/2024   COPD GOLD 2 02/07/2023   Sepsis (HCC) 02/05/2023   Sepsis due to pneumonia (HCC) 02/04/2023   Acute  respiratory failure with hypoxia (HCC) 02/04/2023   Hypoalbuminemia due to protein-calorie malnutrition 02/04/2023   Thrombocytosis 02/04/2023   Hyponatremia 02/04/2023   Essential hypertension 02/04/2023   Shoulder pain, left 09/21/2022   Acute exacerbation of chronic obstructive pulmonary disease (COPD) (HCC) 09/21/2022   Cigarette smoker 09/21/2022   Recent unexplained weight loss 09/21/2022   Preventative health care 06/21/2019   GERD (gastroesophageal reflux disease) 06/21/2019   Constipation 06/21/2019    PCP: Halbert Mariano SQUIBB, DO   REFERRING PROVIDER: Darnella Dorn SAUNDERS, MD  REFERRING DIAG: S22.060A (ICD-10-CM) - Wedge compression fracture of T7-T8 vertebra, initial encounter for closed fracture  Rationale for Evaluation and Treatment: Rehabilitation  THERAPY DIAG:  Chronic bilateral thoracic back pain  Impaired functional mobility, balance, gait, and endurance  ONSET DATE: 6 months ago  SUBJECTIVE:  SUBJECTIVE STATEMENT: Continues to wear her CASH brace when performing ADLs at home, did not wear to therapy today.  Pain scale 6/10 stabbing pain in mid back.  Evaluation: Pt states doctor said she had 3 fractures in 3 vertebrae, not sure when it happened thinks it was probably when she fell a couple times at work, [employer]. Pt states she is also having some spasm in right shoulder everyday, very annoying. Pt states there is a burning sensation in between the shoulder blades. Pt states she has bene out of pain meds for a couple of days. Pt states she was in the hospital 3 months ago after she passed out and hit toilet pretty hard. Pt states she is getting a back brace fitting next Monday.  PERTINENT HISTORY:  2 falls Hx of injections Osteoporosis, going through menopause Pt states she has  chest pain but has been checked out for heart etc.  Anxiety COPD  PAIN:  Are you having pain? Yes: NPRS scale: 4/10 Pain location: mid back and shoulder blades Pain description: burning, sharp pain Aggravating factors: worse after carrying groceries and climbing steps Relieving factors: laying on left side, pain medications  PRECAUTIONS: None  RED FLAGS: Chest pain, tests checked out on heart   WEIGHT BEARING RESTRICTIONS: No  FALLS:  Has patient fallen in last 6 months? Yes. Number of falls 1, tripped, twisted and landed on her butt  LIVING ENVIRONMENT: Lives with: lives with their spouse Lives in: House/apartment Stairs: Yes: External: 15 steps; on right going up Has following equipment at home: None  OCCUPATION: out of work for 6 months  PLOF: Independent and Independent with basic ADLs  PATIENT GOALS: decrease the back pain, increase overall mobility  NEXT MD VISIT: 08/02/24 or Feb  OBJECTIVE:  Note: Objective measures were completed at Evaluation unless otherwise noted.  DIAGNOSTIC FINDINGS:  CLINICAL DATA:  T7 compression fracture   EXAM: MRI THORACIC SPINE WITHOUT CONTRAST   TECHNIQUE: Multiplanar, multisequence MR imaging of the thoracic spine was performed. No intravenous contrast was administered.   COMPARISON:  Plain film 11/17/2025n   FINDINGS: Alignment: Normal   Bone marrow signal: There are mild compression fractures of the superior endplate of T7 in the inferior endplate of T8 with mild edema. There is a compression fracture of the superior endplate of T11 with mild edema with about 20% loss of vertebral height.   Thoracic spinal cord: Normal   Facet joints: No significant abnormality   Intervertebral discs: No significant abnormality   Paraspinal tissues: No significant abnormality   IMPRESSION: Acute or subacute benign compression fractures of the superior endplate of T7 and the inferior endplate of T8 with minimal loss  of vertebral height and the superior endplate of T11 with about 20% loss of vertebral height.  PATIENT SURVEYS:  Modified Oswestry: 24 / 50 = 48.0 %     SENSATION: Light touch: Impaired , LUE N&T on medial 2 digits   POSTURE: rounded shoulders, forward head, and flexed trunk   PALPATION: Increased tenderness on spinous processes, T4-T11, bilateral paraspinals of above spinal segments and increased tone in bilateral upper traps.  THORACOLUMBAR ROM:   AROM eval  Flexion Pain with OP, WFL  Extension Most limited, most pain  Right lateral flexion Slight pull on left side of back, WFL  Left lateral flexion WFL  Right rotation Slight pain, WFL  Left rotation Slight pain, WFL   (Blank rows = not tested)  FUNCTIONAL TESTS:  5 times sit to stand: 13.66,  no increased pain 2 minute walk test: 336 feet, SOB noted    SLS 06/28/24: R: 2.3 seconds L: 1.7 seconds  GAIT: Distance walked: 440 feet Assistive device utilized: None Level of assistance: Complete Independence Comments: Pt demonstrates increased lateral trunk sway and antalgic pattern with decreased rotation of thoracic and lumbar spinal segments.   TREATMENT DATE:  07/19/24: Nustep UE/LE 5 minutes level 4 seat 8 RTB shoulder extensions 10X2  RTB scapular retractions 10X  RTB pallof each direction 10X each Back against wall UE flexion cueing for posterior pelvic tilt Seated pelvic tilt Instructed log rolling Supine posterior pelvic tilt with tactile cueing  Deep breathing to calm CNS  Abdominal sets with verbal and tactile cueing  Marching with ab set  07/17/24 Nustep UE/LE 5 minutes level 4 seat 8 Standing:  lumbar extension 10X  RTB shoulder extensions 10X2  RTB scapular retractions 10X  RTB pallof each direction 10X each  Seated:  scapular Retraction 10X5  Thoracic excursions with UE movements 5X each direction/UE Supine: decompression exercises 1-5, 5X each Abdominal isometrics 10X5  Bridge 10X3  SLR with  core engagement 10X each  07/03/24 Goal review Seated:  scapular Retraction 10X5  Thoracic excursions with UE movements 5X each direction/UE Supine: Abdominal isometrics 10X5  Bridge 10X3  SLR with core engagement 10X each Sit to stands eccentric lowering 10X no UE Standing lumbar extension 5X   06/28/2024  Evaluation: -ROM measured, Strength assessed, HEP prescribed, pt educated on prognosis, findings, and importance of HEP compliance if given.                                                                                                                                 PATIENT EDUCATION:  Education details: Pt was educated on findings of PT evaluation, prognosis, frequency of therapy visits and rationale, attendance policy, and HEP if given.   Person educated: Patient Education method: Explanation, Verbal cues, and Handouts Education comprehension: verbalized understanding, tactile cues required, and needs further education  HOME EXERCISE PROGRAM: Access Code: [email or web address] URL: [email or web address] Date: 06/28/2024 Prepared by: Lang Ada Exercises - Seated Scapular Retraction  - 1 x daily - 7 x weekly - 3 sets - 10 reps - Sidelying Open Book Thoracic Lumbar Rotation and Extension  - 1 x daily - 7 x weekly - 3 sets - 10 reps - Prone Press Up On Elbows  - 1 x daily - 7 x weekly - 1 sets - 3 reps - 30 hold  Access Code: [email or web address] URL: [email or web address] Date: 07/03/2024 Prepared by: Greig Fuse - Supine Transversus Abdominis Bracing - Hands on Stomach  - 2 x daily - 7 x weekly - 10 reps - 5 sec hold - Supine Active Straight Leg Raise  - 2 x daily - 7 x weekly - 10 reps - Sit to Stand  - 2 x daily - 7 x weekly - 10 reps Thoracic excursions with UE movements 5X each  direction  ASSESSMENT:  CLINICAL IMPRESSION: Pt limited by increased pain today.  Began session on nustep for dynamic warm up.  Presents with weak core and poor pelvic control.   Educated on importance of core strength to support lower back as well as posture for pain control.  Presents with best pelvic control in supine position with verbal and tactile cueing and paired with exhalation to reduce holding breath through session.  Educated on deep breathing to calm CNS to assist with pain control.  No additional exercises added to HEP outside of breathing through all exercises.  EOS pain reduced to 4-5/10.   OBJECTIVE IMPAIRMENTS: Abnormal gait, cardiopulmonary status limiting activity, decreased activity tolerance, decreased balance, decreased endurance, decreased knowledge of condition, decreased knowledge of use of DME, decreased mobility, difficulty walking, decreased ROM, decreased strength, and pain.   ACTIVITY LIMITATIONS: carrying, lifting, bending, sitting, standing, squatting, sleeping, stairs, transfers, and bed mobility  PARTICIPATION LIMITATIONS: meal prep, cleaning, laundry, driving, shopping, community activity, occupation, and yard work  PERSONAL FACTORS: Age, Fitness, Past/current experiences, Time since onset of injury/illness/exacerbation, and 1 comorbidity: COPD are also affecting patient's functional outcome.   REHAB POTENTIAL: Fair chronic in nature  CLINICAL DECISION MAKING: Stable/uncomplicated  EVALUATION COMPLEXITY: Low   GOALS: Goals reviewed with patient? Yes  SHORT TERM GOALS: Target date: 07/19/24  Pt will be independent with HEP in order to demonstrate participation in Physical Therapy POC.  Baseline: Goal status: IN PROGRESS  2.  Pt will report 4/10 pain with mobility in order to demonstrate improved pain with ADLs.  Baseline: 6/10 Goal status: IN PROGRESS  LONG TERM GOALS: Target date: 08/09/24  Pt will improve SLS by 6 seconds in order to demonstrate improved functional balance to return to desired activities.  Baseline: see objective.  Goal status: IN PROGRESS  2.  Pt will improve 2 MWT by 40 feet with improved gait  pattern in order to demonstrate improved functional ambulatory capacity in community setting.  Baseline: see objective.  Goal status: IN PROGRESS  3.  Pt will improve Modified Oswestry score by at least 6 points in order to demonstrate improved pain with functional goals and outcomes. Baseline: see objective.  Goal status: IN PROGRESS  4.  Pt will report 3/10 pain with mobility in order to demonstrate reduced pain with ADLs lasting greater than 30 minutes.  Baseline: see objective.  Goal status: IN PROGRESS   PLAN:  PT FREQUENCY: 1-2x/week  PT DURATION: 6 weeks  PLANNED INTERVENTIONS: 97110-Therapeutic exercises, 97530- Therapeutic activity, 97112- Neuromuscular re-education, 97535- Self Care, 02859- Manual therapy, [phone]- Gait training, [phone]- Electrical stimulation (unattended), [phone]- Electrical stimulation (manual), [phone] (1-2 muscles), [phone] (3+ muscles)- Dry Needling, Patient/Family education, Balance training, Stair training, Taping, Joint mobilization, Joint manipulation, Spinal manipulation, Spinal mobilization, DME instructions, Cryotherapy, and Moist heat.  PLAN FOR NEXT SESSION:  progress postural and core strengthening, COPD pt, progress thoracic spine mobility, assess UE/LE strength. Progress core.   Augustin Mclean, LPTA/CLT; CBIS [phone]  9:52 AM, 07/19/2024    "

## 2024-07-20 ENCOUNTER — Encounter: Payer: Self-pay | Admitting: Internal Medicine

## 2024-07-23 ENCOUNTER — Encounter (HOSPITAL_COMMUNITY): Payer: Self-pay

## 2024-07-23 ENCOUNTER — Ambulatory Visit (HOSPITAL_COMMUNITY)

## 2024-07-23 DIAGNOSIS — G8929 Other chronic pain: Secondary | ICD-10-CM

## 2024-07-23 DIAGNOSIS — M546 Pain in thoracic spine: Secondary | ICD-10-CM | POA: Diagnosis not present

## 2024-07-23 DIAGNOSIS — Z7409 Other reduced mobility: Secondary | ICD-10-CM

## 2024-07-23 NOTE — Therapy (Signed)
 " OUTPATIENT PHYSICAL THERAPY THORACOLUMBAR TREATMENT   Patient Name: Sherry [name] MRN: [id number] DOB:Jan 17, 1971, 54 y.o., female Today's Date: 07/23/2024  END OF SESSION:  PT End of Session - 07/23/24 1118     Visit Number 5    Number of Visits 8    Date for Recertification  08/09/24    Authorization Type [place] MEDICAID [facility]    Authorization Time Period 8 visits approved 12/18-2/16/26    Authorization - Visit Number 4    Authorization - Number of Visits 8    Progress Note Due on Visit 10    PT Start Time 1118    PT Stop Time 1158    PT Time Calculation (min) 40 min    Activity Tolerance Patient tolerated treatment well;Patient limited by pain    Behavior During Therapy WFL for tasks assessed/performed            Past Medical History:  Diagnosis Date   COPD (chronic obstructive pulmonary disease) (HCC)    Depression    High cholesterol    Hypertension    Lung collapse    left   Past Surgical History:  Procedure Laterality Date   CHEST TUBE INSERTION     COLONOSCOPY WITH PROPOFOL  N/A 03/17/2022   Procedure: COLONOSCOPY WITH PROPOFOL ;  Surgeon: Eartha Angelia Sieving, MD;  [facility];  Service: Gastroenterology;  Laterality: N/A;  930 ASA 2   ENDOMETRIAL ABLATION     ESOPHAGOGASTRODUODENOSCOPY N/A 06/01/2024   Procedure: EGD (ESOPHAGOGASTRODUODENOSCOPY);  Surgeon: Eartha Angelia, Sieving, MD;  Location: [facility];  Service: Gastroenterology;  Laterality: N/A;  12:00pm, ASA 1-2   POLYPECTOMY  03/17/2022   Procedure: POLYPECTOMY;  Surgeon: Eartha Angelia Sieving, MD;  Location: [facility];  Service: Gastroenterology;;   Patient Active Problem List   Diagnosis Date Noted   SIRS (systemic inflammatory response syndrome) (HCC) 03/14/2024   Hypokalemia 03/14/2024   Acute encephalopathy 03/14/2024   Acute metabolic encephalopathy 03/14/2024   COPD GOLD 2 02/07/2023   Sepsis (HCC) 02/05/2023   Sepsis due to pneumonia (HCC) 02/04/2023   Acute  respiratory failure with hypoxia (HCC) 02/04/2023   Hypoalbuminemia due to protein-calorie malnutrition 02/04/2023   Thrombocytosis 02/04/2023   Hyponatremia 02/04/2023   Essential hypertension 02/04/2023   Shoulder pain, left 09/21/2022   Acute exacerbation of chronic obstructive pulmonary disease (COPD) (HCC) 09/21/2022   Cigarette smoker 09/21/2022   Recent unexplained weight loss 09/21/2022   Preventative health care 06/21/2019   GERD (gastroesophageal reflux disease) 06/21/2019   Constipation 06/21/2019    PCP: Halbert Mariano SQUIBB, DO   REFERRING PROVIDER: Darnella Dorn SAUNDERS, MD  REFERRING DIAG: S22.060A (ICD-10-CM) - Wedge compression fracture of T7-T8 vertebra, initial encounter for closed fracture  Rationale for Evaluation and Treatment: Rehabilitation  THERAPY DIAG:  Chronic bilateral thoracic back pain  Impaired functional mobility, balance, gait, and endurance  ONSET DATE: 6 months ago  SUBJECTIVE:  SUBJECTIVE STATEMENT: Pt reports back pain is tolerable this date, 5-6/10. Pt asks about wearing CASH brace to therapy because she can not wear it to drive. Pt states HEP is going okay.  Evaluation: Pt states doctor said she had 3 fractures in 3 vertebrae, not sure when it happened thinks it was probably when she fell a couple times at work, [employer]. Pt states she is also having some spasm in right shoulder everyday, very annoying. Pt states there is a burning sensation in between the shoulder blades. Pt states she has bene out of pain meds for a couple of days. Pt states she was in the hospital 3 months ago after she passed out and hit toilet pretty hard. Pt states she is getting a back brace fitting next Monday.  PERTINENT HISTORY:  2 falls Hx of injections Osteoporosis, going through  menopause Pt states she has chest pain but has been checked out for heart etc.  Anxiety COPD  PAIN:  Are you having pain? Yes: NPRS scale: 4/10 Pain location: mid back and shoulder blades Pain description: burning, sharp pain Aggravating factors: worse after carrying groceries and climbing steps Relieving factors: laying on left side, pain medications  PRECAUTIONS: None  RED FLAGS: Chest pain, tests checked out on heart   WEIGHT BEARING RESTRICTIONS: No  FALLS:  Has patient fallen in last 6 months? Yes. Number of falls 1, tripped, twisted and landed on her butt  LIVING ENVIRONMENT: Lives with: lives with their spouse Lives in: House/apartment Stairs: Yes: External: 15 steps; on right going up Has following equipment at home: None  OCCUPATION: out of work for 6 months  PLOF: Independent and Independent with basic ADLs  PATIENT GOALS: decrease the back pain, increase overall mobility  NEXT MD VISIT: 08/02/24 or Feb  OBJECTIVE:  Note: Objective measures were completed at Evaluation unless otherwise noted.  DIAGNOSTIC FINDINGS:  CLINICAL DATA:  T7 compression fracture   EXAM: MRI THORACIC SPINE WITHOUT CONTRAST   TECHNIQUE: Multiplanar, multisequence MR imaging of the thoracic spine was performed. No intravenous contrast was administered.   COMPARISON:  Plain film 11/17/2025n   FINDINGS: Alignment: Normal   Bone marrow signal: There are mild compression fractures of the superior endplate of T7 in the inferior endplate of T8 with mild edema. There is a compression fracture of the superior endplate of T11 with mild edema with about 20% loss of vertebral height.   Thoracic spinal cord: Normal   Facet joints: No significant abnormality   Intervertebral discs: No significant abnormality   Paraspinal tissues: No significant abnormality   IMPRESSION: Acute or subacute benign compression fractures of the superior endplate of T7 and the inferior endplate of T8  with minimal loss of vertebral height and the superior endplate of T11 with about 20% loss of vertebral height.  PATIENT SURVEYS:  Modified Oswestry: 24 / 50 = 48.0 %     SENSATION: Light touch: Impaired , LUE N&T on medial 2 digits   POSTURE: rounded shoulders, forward head, and flexed trunk   PALPATION: Increased tenderness on spinous processes, T4-T11, bilateral paraspinals of above spinal segments and increased tone in bilateral upper traps.  THORACOLUMBAR ROM:   AROM eval  Flexion Pain with OP, WFL  Extension Most limited, most pain  Right lateral flexion Slight pull on left side of back, WFL  Left lateral flexion WFL  Right rotation Slight pain, WFL  Left rotation Slight pain, WFL   (Blank rows = not tested)  FUNCTIONAL TESTS:  5  times sit to stand: 13.66, no increased pain 2 minute walk test: 336 feet, SOB noted    SLS 06/28/24: R: 2.3 seconds L: 1.7 seconds  GAIT: Distance walked: 440 feet Assistive device utilized: None Level of assistance: Complete Independence Comments: Pt demonstrates increased lateral trunk sway and antalgic pattern with decreased rotation of thoracic and lumbar spinal segments.   TREATMENT DATE:  07/23/2024  Manual Therapy: -STM of Thoracic vertebral segments, T9-T12 Paraspinal musculature Therapeutic Exercise: -Nustep, 5 minutes, level 4 resistance, pt cued for 80-90 spm -Side lying open books, 1 set of 15 reps, pt cued for pain free ROM -Supine bridges with RTB at knees, 2 sets of 10 reps, 3 second holds, pt cued for max hip extension -Cat Cow stretch, 1 sets of 15 reps, pt requires tactile cues for proper coordination Neuromuscular Re-education:  -Thread the needle, 1 set of 3 bilaterally, ceased due to pain, pt cued for proper sequencing -Side plank from knee, 1 set of 3 reps of 10 second holds, bilaterally, pt cued for increased hip elevation and proper LE/UE placement, more pain on the  -Bird dog, 2 set of 5 reps, bilaterally, pt  cued for increased hip ROM and neutral spine throughout movement    07/19/24: Nustep UE/LE 5 minutes level 4 seat 8 RTB shoulder extensions 10X2  RTB scapular retractions 10X  RTB pallof each direction 10X each Back against wall UE flexion cueing for posterior pelvic tilt Seated pelvic tilt Instructed log rolling Supine posterior pelvic tilt with tactile cueing  Deep breathing to calm CNS  Abdominal sets with verbal and tactile cueing  Marching with ab set  07/17/24 Nustep UE/LE 5 minutes level 4 seat 8 Standing:  lumbar extension 10X  RTB shoulder extensions 10X2  RTB scapular retractions 10X  RTB pallof each direction 10X each  Seated:  scapular Retraction 10X5  Thoracic excursions with UE movements 5X each direction/UE Supine: decompression exercises 1-5, 5X each Abdominal isometrics 10X5  Bridge 10X3  SLR with core engagement 10X each                                                                                                                                  PATIENT EDUCATION:  Education details: Pt was educated on findings of PT evaluation, prognosis, frequency of therapy visits and rationale, attendance policy, and HEP if given.   Person educated: Patient Education method: Explanation, Verbal cues, and Handouts Education comprehension: verbalized understanding, tactile cues required, and needs further education  HOME EXERCISE PROGRAM: Access Code: [email or web address] URL: [email or web address] Date: 06/28/2024 Prepared by: Lang Ada Exercises - Seated Scapular Retraction  - 1 x daily - 7 x weekly - 3 sets - 10 reps - Sidelying Open Book Thoracic Lumbar Rotation and Extension  - 1 x daily - 7 x weekly - 3 sets - 10 reps - Prone Press Up On Elbows  - 1 x daily - 7 x weekly - 1  sets - 3 reps - 30 hold  Access Code: [email or web address] URL: [email or web address] Date: 07/03/2024 Prepared by: Greig Fuse - Supine Transversus Abdominis Bracing - Hands  on Stomach  - 2 x daily - 7 x weekly - 10 reps - 5 sec hold - Supine Active Straight Leg Raise  - 2 x daily - 7 x weekly - 10 reps - Sit to Stand  - 2 x daily - 7 x weekly - 10 reps Thoracic excursions with UE movements 5X each direction  ASSESSMENT:  CLINICAL IMPRESSION: Patient continues to demonstrate increased thoracic back pain, decreased core/postural strength, increased thoracic kyphotic posture and balance deficits. Patient also demonstrates decreased endurance with exercise during today's session with SOB noted. Patient able to progress dynamic balance and core activation exercises today with side planks and open book variations, good performance with verbal cueing. Pt demonstrates increase in symptoms with rotational movements, manual therapy at conclusion of session for decreased pain. Pt educated on the impact smoking has on bone and overall health. Patient would continue to benefit from skilled physical therapy for increased endurance with ambulation and activity tolerance, increased core and postural strength, and improved balance for improved quality of life, improved independence with management of back pain and continued progress towards therapy goals.    OBJECTIVE IMPAIRMENTS: Abnormal gait, cardiopulmonary status limiting activity, decreased activity tolerance, decreased balance, decreased endurance, decreased knowledge of condition, decreased knowledge of use of DME, decreased mobility, difficulty walking, decreased ROM, decreased strength, and pain.   ACTIVITY LIMITATIONS: carrying, lifting, bending, sitting, standing, squatting, sleeping, stairs, transfers, and bed mobility  PARTICIPATION LIMITATIONS: meal prep, cleaning, laundry, driving, shopping, community activity, occupation, and yard work  PERSONAL FACTORS: Age, Fitness, Past/current experiences, Time since onset of injury/illness/exacerbation, and 1 comorbidity: COPD are also affecting patient's functional outcome.    REHAB POTENTIAL: Fair chronic in nature  CLINICAL DECISION MAKING: Stable/uncomplicated  EVALUATION COMPLEXITY: Low   GOALS: Goals reviewed with patient? Yes  SHORT TERM GOALS: Target date: 07/19/24  Pt will be independent with HEP in order to demonstrate participation in Physical Therapy POC.  Baseline: Goal status: IN PROGRESS  2.  Pt will report 4/10 pain with mobility in order to demonstrate improved pain with ADLs.  Baseline: 6/10 Goal status: IN PROGRESS  LONG TERM GOALS: Target date: 08/09/24  Pt will improve SLS by 6 seconds in order to demonstrate improved functional balance to return to desired activities.  Baseline: see objective.  Goal status: IN PROGRESS  2.  Pt will improve 2 MWT by 40 feet with improved gait pattern in order to demonstrate improved functional ambulatory capacity in community setting.  Baseline: see objective.  Goal status: IN PROGRESS  3.  Pt will improve Modified Oswestry score by at least 6 points in order to demonstrate improved pain with functional goals and outcomes. Baseline: see objective.  Goal status: IN PROGRESS  4.  Pt will report 3/10 pain with mobility in order to demonstrate reduced pain with ADLs lasting greater than 30 minutes.  Baseline: see objective.  Goal status: IN PROGRESS   PLAN:  PT FREQUENCY: 1-2x/week  PT DURATION: 6 weeks  PLANNED INTERVENTIONS: 97110-Therapeutic exercises, 97530- Therapeutic activity, 97112- Neuromuscular re-education, 97535- Self Care, [id number]- Manual therapy, [phone]- Gait training, [phone]- Electrical stimulation (unattended), [phone]- Electrical stimulation (manual), [phone] (1-2 muscles), [phone] (3+ muscles)- Dry Needling, Patient/Family education, Balance training, Stair training, Taping, Joint mobilization, Joint manipulation, Spinal manipulation, Spinal mobilization, DME instructions, Cryotherapy, and Moist heat.  PLAN FOR NEXT SESSION:  progress postural and core strengthening, COPD pt,  progress thoracic spine mobility, assess UE/LE strength. Progress core.  Lang Ada, PT, DPT [facility] Office: [phone] 12:18 PM, 07/23/2024     "

## 2024-07-25 ENCOUNTER — Ambulatory Visit (HOSPITAL_COMMUNITY): Admitting: Physical Therapy

## 2024-07-27 ENCOUNTER — Ambulatory Visit (HOSPITAL_COMMUNITY)

## 2024-08-01 ENCOUNTER — Ambulatory Visit (HOSPITAL_COMMUNITY): Admitting: Physical Therapy

## 2024-08-01 DIAGNOSIS — G8929 Other chronic pain: Secondary | ICD-10-CM

## 2024-08-01 DIAGNOSIS — M546 Pain in thoracic spine: Secondary | ICD-10-CM | POA: Diagnosis not present

## 2024-08-01 DIAGNOSIS — Z7409 Other reduced mobility: Secondary | ICD-10-CM

## 2024-08-01 NOTE — Therapy (Signed)
 " OUTPATIENT PHYSICAL THERAPY THORACOLUMBAR TREATMENT   Patient Name: Sherry [name] MRN: [id number] DOB:1971/04/06, 54 y.o., female Today's Date: 08/01/2024  END OF SESSION:  PT End of Session - 08/01/24 0817     Visit Number 6    Number of Visits 8    Date for Recertification  08/09/24    Authorization Type [place] [employer] [facility]    Authorization Time Period 8 visits approved 12/18-2/16/26    Authorization - Visit Number 5    Authorization - Number of Visits 8    Progress Note Due on Visit 10    PT Start Time 0818    PT Stop Time 0848    PT Time Calculation (min) 30 min    Activity Tolerance Patient tolerated treatment well;Patient limited by pain    Behavior During Therapy WFL for tasks assessed/performed             Past Medical History:  Diagnosis Date   COPD (chronic obstructive pulmonary disease) (HCC)    Depression    High cholesterol    Hypertension    Lung collapse    left   Past Surgical History:  Procedure Laterality Date   CHEST TUBE INSERTION     COLONOSCOPY WITH PROPOFOL  N/A 03/17/2022   Procedure: COLONOSCOPY WITH PROPOFOL ;  Surgeon: Eartha Angelia Sieving, MD;  [facility]: [facility];  Service: Gastroenterology;  Laterality: N/A;  930 ASA 2   ENDOMETRIAL ABLATION     ESOPHAGOGASTRODUODENOSCOPY N/A 06/01/2024   Procedure: EGD (ESOPHAGOGASTRODUODENOSCOPY);  Surgeon: Eartha Angelia, Sieving, MD;  Location: [facility];  Service: Gastroenterology;  Laterality: N/A;  12:00pm, ASA 1-2   POLYPECTOMY  03/17/2022   Procedure: POLYPECTOMY;  Surgeon: Eartha Angelia Sieving, MD;  Location: [facility];  Service: Gastroenterology;;   Patient Active Problem List   Diagnosis Date Noted   SIRS (systemic inflammatory response syndrome) (HCC) 03/14/2024   Hypokalemia 03/14/2024   Acute encephalopathy 03/14/2024   Acute metabolic encephalopathy 03/14/2024   COPD GOLD 2 02/07/2023   Sepsis (HCC) 02/05/2023   Sepsis due to pneumonia (HCC) 02/04/2023   Acute  respiratory failure with hypoxia (HCC) 02/04/2023   Hypoalbuminemia due to protein-calorie malnutrition 02/04/2023   Thrombocytosis 02/04/2023   Hyponatremia 02/04/2023   Essential hypertension 02/04/2023   Shoulder pain, left 09/21/2022   Acute exacerbation of chronic obstructive pulmonary disease (COPD) (HCC) 09/21/2022   Cigarette smoker 09/21/2022   Recent unexplained weight loss 09/21/2022   Preventative health care 06/21/2019   GERD (gastroesophageal reflux disease) 06/21/2019   Constipation 06/21/2019    PCP: Halbert Mariano SQUIBB, DO   REFERRING PROVIDER: Darnella Dorn SAUNDERS, MD  REFERRING DIAG: S22.060A (ICD-10-CM) - Wedge compression fracture of T7-T8 vertebra, initial encounter for closed fracture  Rationale for Evaluation and Treatment: Rehabilitation  THERAPY DIAG:  Chronic bilateral thoracic back pain  Impaired functional mobility, balance, gait, and endurance  ONSET DATE: 6 months ago  SUBJECTIVE:  SUBJECTIVE STATEMENT: Pt reports back pain is around 6/10 today.  States her Rt knee also hurts but believes it is due to the cold weather. States she missed her last appt due to stomach bug.   Evaluation: Pt states doctor said she had 3 fractures in 3 vertebrae, not sure when it happened thinks it was probably when she fell a couple times at work, [employer]. Pt states she is also having some spasm in right shoulder everyday, very annoying. Pt states there is a burning sensation in between the shoulder blades. Pt states she has bene out of pain meds for a couple of days. Pt states she was in the hospital 3 months ago after she passed out and hit toilet pretty hard. Pt states she is getting a back brace fitting next Monday.  PERTINENT HISTORY:  2 falls Hx of injections Osteoporosis, going through  menopause Pt states she has chest pain but has been checked out for heart etc.  Anxiety COPD  PAIN:  Are you having pain? Yes: NPRS scale: 6/10 Pain location: mid back and shoulder blades Pain description: burning, sharp pain Aggravating factors: worse after carrying groceries and climbing steps Relieving factors: laying on left side, pain medications  PRECAUTIONS: None  RED FLAGS: Chest pain, tests checked out on heart   WEIGHT BEARING RESTRICTIONS: No  FALLS:  Has patient fallen in last 6 months? Yes. Number of falls 1, tripped, twisted and landed on her butt  LIVING ENVIRONMENT: Lives with: lives with their spouse Lives in: House/apartment Stairs: Yes: External: 15 steps; on right going up Has following equipment at home: None  OCCUPATION: out of work for 6 months  PLOF: Independent and Independent with basic ADLs  PATIENT GOALS: decrease the back pain, increase overall mobility  NEXT MD VISIT: 08/02/24 or Feb  OBJECTIVE:  Note: Objective measures were completed at Evaluation unless otherwise noted.  DIAGNOSTIC FINDINGS:  CLINICAL DATA:  T7 compression fracture   EXAM: MRI THORACIC SPINE WITHOUT CONTRAST   TECHNIQUE: Multiplanar, multisequence MR imaging of the thoracic spine was performed. No intravenous contrast was administered.   COMPARISON:  Plain film 11/17/2025n   FINDINGS: Alignment: Normal   Bone marrow signal: There are mild compression fractures of the superior endplate of T7 in the inferior endplate of T8 with mild edema. There is a compression fracture of the superior endplate of T11 with mild edema with about 20% loss of vertebral height.   Thoracic spinal cord: Normal   Facet joints: No significant abnormality   Intervertebral discs: No significant abnormality   Paraspinal tissues: No significant abnormality   IMPRESSION: Acute or subacute benign compression fractures of the superior endplate of T7 and the inferior endplate of T8  with minimal loss of vertebral height and the superior endplate of T11 with about 20% loss of vertebral height.  PATIENT SURVEYS:  Modified Oswestry: 24 / 50 = 48.0 %     SENSATION: Light touch: Impaired , LUE N&T on medial 2 digits   POSTURE: rounded shoulders, forward head, and flexed trunk   PALPATION: Increased tenderness on spinous processes, T4-T11, bilateral paraspinals of above spinal segments and increased tone in bilateral upper traps.  THORACOLUMBAR ROM:   AROM eval  Flexion Pain with OP, WFL  Extension Most limited, most pain  Right lateral flexion Slight pull on left side of back, WFL  Left lateral flexion WFL  Right rotation Slight pain, WFL  Left rotation Slight pain, WFL   (Blank rows = not tested)  FUNCTIONAL TESTS:  5 times sit to stand: 13.66, no increased pain 2 minute walk test: 336 feet, SOB noted    SLS 06/28/24: R: 2.3 seconds L: 1.7 seconds  GAIT: Distance walked: 440 feet Assistive device utilized: None Level of assistance: Complete Independence Comments: Pt demonstrates increased lateral trunk sway and antalgic pattern with decreased rotation of thoracic and lumbar spinal segments.   TREATMENT DATE:  08/01/24: Nustep UE/LE 5 minutes level 4 seat 8 Standing:  GTB shoulder extensions 10X2  GTB scapular retractions 10X2  GTB pallof each direction 10X2 each Back against wall UE flexion cueing for posterior pelvic tilt Supine posterior pelvic tilt with tactile cueing  Bridge with Abdominal sets 10X  Marching with ab set 10X  SLR 10X Pt became dizzy, requested EOS  07/23/2024  Manual Therapy: -STM of Thoracic vertebral segments, T9-T12 Paraspinal musculature Therapeutic Exercise: -Nustep, 5 minutes, level 4 resistance, pt cued for 80-90 spm -Side lying open books, 1 set of 15 reps, pt cued for pain free ROM -Supine bridges with RTB at knees, 2 sets of 10 reps, 3 second holds, pt cued for max hip extension -Cat Cow stretch, 1 sets of 15  reps, pt requires tactile cues for proper coordination Neuromuscular Re-education:  -Thread the needle, 1 set of 3 bilaterally, ceased due to pain, pt cued for proper sequencing -Side plank from knee, 1 set of 3 reps of 10 second holds, bilaterally, pt cued for increased hip elevation and proper LE/UE placement, more pain on the  -Bird dog, 2 set of 5 reps, bilaterally, pt cued for increased hip ROM and neutral spine throughout movement   07/19/24: Nustep UE/LE 5 minutes level 4 seat 8 RTB shoulder extensions 10X2  RTB scapular retractions 10X  RTB pallof each direction 10X each Back against wall UE flexion cueing for posterior pelvic tilt Seated pelvic tilt Instructed log rolling Supine posterior pelvic tilt with tactile cueing  Deep breathing to calm CNS  Abdominal sets with verbal and tactile cueing  Marching with ab set  07/17/24 Nustep UE/LE 5 minutes level 4 seat 8 Standing:  lumbar extension 10X  RTB shoulder extensions 10X2  RTB scapular retractions 10X  RTB pallof each direction 10X each  Seated:  scapular Retraction 10X5  Thoracic excursions with UE movements 5X each direction/UE Supine: decompression exercises 1-5, 5X each Abdominal isometrics 10X5  Bridge 10X3  SLR with core engagement 10X each                                                                                                                                  PATIENT EDUCATION:  Education details: Pt was educated on findings of PT evaluation, prognosis, frequency of therapy visits and rationale, attendance policy, and HEP if given.   Person educated: Patient Education method: Explanation, Verbal cues, and Handouts Education comprehension: verbalized understanding, tactile cues required, and needs further education  HOME EXERCISE PROGRAM: Access Code: [email or web address] URL:  [email or web address] Date: 06/28/2024 Prepared by: Lang Ada Exercises - Seated Scapular Retraction  - 1 x daily -  7 x weekly - 3 sets - 10 reps - Sidelying Open Book Thoracic Lumbar Rotation and Extension  - 1 x daily - 7 x weekly - 3 sets - 10 reps - Prone Press Up On Elbows  - 1 x daily - 7 x weekly - 1 sets - 3 reps - 30 hold  Access Code: [email or web address] URL: [email or web address] Date: 07/03/2024 Prepared by: Greig Fuse - Supine Transversus Abdominis Bracing - Hands on Stomach  - 2 x daily - 7 x weekly - 10 reps - 5 sec hold - Supine Active Straight Leg Raise  - 2 x daily - 7 x weekly - 10 reps - Sit to Stand  - 2 x daily - 7 x weekly - 10 reps Thoracic excursions with UE movements 5X each direction  ASSESSMENT:  CLINICAL IMPRESSION: Continued with focus on improving core and postural strength.  Pt requested hold on quadruped activity due to increased Rt knee pain today.  Completed postural strengthening using bands and able to increase to green for more resistance. Pallof remain challenging with good core activation.  Cues needed for general form, posturing and slowing speed of activity.  Supine exercises complete with abdominal recruitment.  Pt completed several activities and sat up reporting dizziness.  States she thinks its from having diarrhea and still being dehydrated.  Pt without any other symptoms, vitals are normal.  Pt request to stop therapy early.  Suggested she get some liquid IV to add to her water to help hydrate her.  Patient will continue to benefit from skilled physical therapy for increased endurance with ambulation and activity tolerance, increased core and postural strength, and improved balance for improved quality of life, improved independence with management of back pain and continued progress towards therapy goals.    OBJECTIVE IMPAIRMENTS: Abnormal gait, cardiopulmonary status limiting activity, decreased activity tolerance, decreased balance, decreased endurance, decreased knowledge of condition, decreased knowledge of use of DME, decreased mobility, difficulty  walking, decreased ROM, decreased strength, and pain.   ACTIVITY LIMITATIONS: carrying, lifting, bending, sitting, standing, squatting, sleeping, stairs, transfers, and bed mobility  PARTICIPATION LIMITATIONS: meal prep, cleaning, laundry, driving, shopping, community activity, occupation, and yard work  PERSONAL FACTORS: Age, Fitness, Past/current experiences, Time since onset of injury/illness/exacerbation, and 1 comorbidity: COPD are also affecting patient's functional outcome.   REHAB POTENTIAL: Fair chronic in nature  CLINICAL DECISION MAKING: Stable/uncomplicated  EVALUATION COMPLEXITY: Low   GOALS: Goals reviewed with patient? Yes  SHORT TERM GOALS: Target date: 07/19/24  Pt will be independent with HEP in order to demonstrate participation in Physical Therapy POC.  Baseline: Goal status: IN PROGRESS  2.  Pt will report 4/10 pain with mobility in order to demonstrate improved pain with ADLs.  Baseline: 6/10 Goal status: IN PROGRESS  LONG TERM GOALS: Target date: 08/09/24  Pt will improve SLS by 6 seconds in order to demonstrate improved functional balance to return to desired activities.  Baseline: see objective.  Goal status: IN PROGRESS  2.  Pt will improve 2 MWT by 40 feet with improved gait pattern in order to demonstrate improved functional ambulatory capacity in community setting.  Baseline: see objective.  Goal status: IN PROGRESS  3.  Pt will improve Modified Oswestry score by at least 6 points in order to demonstrate improved pain with functional goals and outcomes. Baseline: see objective.  Goal status: IN PROGRESS  4.  Pt will report 3/10 pain with mobility in order to demonstrate reduced pain with ADLs lasting greater than 30 minutes.  Baseline: see objective.  Goal status: IN PROGRESS   PLAN:  PT FREQUENCY: 1-2x/week  PT DURATION: 6 weeks  PLANNED INTERVENTIONS: 97110-Therapeutic exercises, 97530- Therapeutic activity, 97112- Neuromuscular  re-education, 97535- Self Care, [id number]- Manual therapy, [phone]- Gait training, [phone]- Electrical stimulation (unattended), [phone]- Electrical stimulation (manual), [phone] (1-2 muscles), [phone] (3+ muscles)- Dry Needling, Patient/Family education, Balance training, Stair training, Taping, Joint mobilization, Joint manipulation, Spinal manipulation, Spinal mobilization, DME instructions, Cryotherapy, and Moist heat.  PLAN FOR NEXT SESSION:  progress postural and core strengthening, COPD pt, progress thoracic spine mobility, assess UE/LE strength. Progress core.  Greig KATHEE Fuse, PTA/CLT [facility] Ph: [phone] 9:03 AM, 08/01/24     "

## 2024-08-02 ENCOUNTER — Ambulatory Visit: Admitting: Neuroradiology

## 2024-08-08 ENCOUNTER — Ambulatory Visit (HOSPITAL_COMMUNITY)

## 2024-08-09 ENCOUNTER — Other Ambulatory Visit (HOSPITAL_COMMUNITY): Payer: Self-pay | Admitting: Neuroradiology

## 2024-08-09 ENCOUNTER — Ambulatory Visit: Admitting: Neuroradiology

## 2024-08-09 ENCOUNTER — Encounter: Payer: Self-pay | Admitting: Neuroradiology

## 2024-08-09 VITALS — BP 116/76 | HR 86 | Temp 97.7°F | Ht 65.0 in | Wt 154.4 lb

## 2024-08-09 DIAGNOSIS — S22000A Wedge compression fracture of unspecified thoracic vertebra, initial encounter for closed fracture: Secondary | ICD-10-CM

## 2024-08-09 DIAGNOSIS — S22080D Wedge compression fracture of T11-T12 vertebra, subsequent encounter for fracture with routine healing: Secondary | ICD-10-CM

## 2024-08-09 DIAGNOSIS — S22060D Wedge compression fracture of T7-T8 vertebra, subsequent encounter for fracture with routine healing: Secondary | ICD-10-CM | POA: Diagnosis not present

## 2024-08-09 DIAGNOSIS — S22060A Wedge compression fracture of T7-T8 vertebra, initial encounter for closed fracture: Secondary | ICD-10-CM

## 2024-08-09 DIAGNOSIS — S22080A Wedge compression fracture of T11-T12 vertebra, initial encounter for closed fracture: Secondary | ICD-10-CM

## 2024-08-09 NOTE — Progress Notes (Signed)
 I had the pleasure of seeing Sherry [name] in the office today for follow-up.  I initially saw her on 06/27/2024 for benign compression fractures at T7, T8 and T11.  I recommended back bracing and physical therapy.  She comes today wearing the TLSO back brace, which she is wearing correctly.  She wears it most of the time.  She feels like the brace does help, but she is still having significant pain that she describes as 7/10.  She does not feel like the physical therapy has helped, and feels like it may make things worse.  On physical exam she is comfortable.  Blood pressure is 116/76. Lungs are clear. Heart sounds are normal. Mallampati is 2, ASA score is 2.  Assessment:  Benign compression fractures of T7, T8 and T11 with continued significant pain despite a period of conservative therapy with back bracing and physical therapy.  Recommendation:  I have explained that the fractures are benign, and it is reasonable to continue with the current course as most fractures do eventually heal.  We did talk about vertebral augmentation with vertebroplasty.  I have again explained to the very uncommon but serious risks of the procedure such as infection or leakage of the cement in the spinal canal.  I have also talked about the more common complication of adjacent level fractures, which may occur 5 to 10% of the time in cases such as hers with severe osteoporosis and multiple fractures.  She understands the above and would like to proceed with the vertebroplasty.  We will schedule this in the near future.

## 2024-08-09 NOTE — Addendum Note (Signed)
 Addended by: Jeane Cashatt on: 08/09/2024 01:58 PM   Modules accepted: Orders

## 2024-08-10 ENCOUNTER — Telehealth: Payer: Self-pay

## 2024-08-10 ENCOUNTER — Other Ambulatory Visit: Payer: Self-pay | Admitting: Neuroradiology

## 2024-08-10 ENCOUNTER — Other Ambulatory Visit: Payer: Self-pay

## 2024-08-10 ENCOUNTER — Encounter (INDEPENDENT_AMBULATORY_CARE_PROVIDER_SITE_OTHER): Payer: Self-pay | Admitting: Gastroenterology

## 2024-08-10 MED ORDER — OXYCODONE HCL 5 MG PO TABS: 5.0000 mg | ORAL_TABLET | Freq: Three times a day (TID) | ORAL | 0 refills | Status: DC | PRN

## 2024-08-10 NOTE — Telephone Encounter (Signed)
 Patient called the answering service last night about her pain medication.   Dr. Lester placed the order today but unfortunately the order never went through Epic.   I attempted to fax the order but the pharmacy would not take it through fax due to it being a controlled substance.   I sent Dr. Lester a message to see if he can change the order from print status to Normal so that it will transmit to the pharmacy.   Patient notified to wait for a message from pharmacy before attempting to pick up.  Order will be modified so that secure transmission to the pharmacy will occur and patient can pick up the medication.

## 2024-08-14 ENCOUNTER — Other Ambulatory Visit: Payer: Self-pay | Admitting: Neuroradiology

## 2024-08-14 ENCOUNTER — Telehealth: Payer: Self-pay

## 2024-08-14 MED ORDER — OXYCODONE HCL 5 MG PO TABS: 5.0000 mg | ORAL_TABLET | Freq: Three times a day (TID) | ORAL | 0 refills | Status: AC | PRN

## 2024-08-14 NOTE — Telephone Encounter (Signed)
 Order fixed by Dr. Lester.

## 2024-08-14 NOTE — Telephone Encounter (Addendum)
 Patient called to follow up on Medication.  Order was edited and sent to pharmacy.  I called the pharmacy and checked it was received.   I left the patient a voicemail to let her know it has been received by the pharmacy.

## 2024-08-15 ENCOUNTER — Ambulatory Visit (HOSPITAL_COMMUNITY): Payer: Self-pay

## 2024-08-16 ENCOUNTER — Telehealth: Payer: Self-pay | Admitting: Neuroradiology

## 2024-08-16 ENCOUNTER — Encounter (HOSPITAL_COMMUNITY): Payer: Self-pay | Admitting: *Deleted

## 2024-08-16 ENCOUNTER — Other Ambulatory Visit: Payer: Self-pay

## 2024-08-16 ENCOUNTER — Ambulatory Visit (HOSPITAL_COMMUNITY)
Admission: RE | Admit: 2024-08-16 | Discharge: 2024-08-16 | Disposition: A | Source: Ambulatory Visit | Attending: Neuroradiology | Admitting: Neuroradiology

## 2024-08-16 DIAGNOSIS — S22080A Wedge compression fracture of T11-T12 vertebra, initial encounter for closed fracture: Secondary | ICD-10-CM

## 2024-08-16 DIAGNOSIS — S22060A Wedge compression fracture of T7-T8 vertebra, initial encounter for closed fracture: Secondary | ICD-10-CM

## 2024-08-16 MED ORDER — FENTANYL CITRATE (PF) 100 MCG/2ML IJ SOLN
INTRAMUSCULAR | Status: AC | PRN
  Administered 2024-08-16: 50 ug via INTRAVENOUS
  Administered 2024-08-16 (×5): 25 ug via INTRAVENOUS

## 2024-08-16 MED ORDER — MIDAZOLAM HCL (PF) 2 MG/2ML IJ SOLN
INTRAMUSCULAR | Status: AC | PRN
  Administered 2024-08-16 (×3): 1 mg via INTRAVENOUS

## 2024-08-16 MED ORDER — MIDAZOLAM HCL 2 MG/2ML IJ SOLN
INTRAMUSCULAR | Status: AC
  Filled 2024-08-16: qty 2

## 2024-08-16 MED ORDER — LIDOCAINE HCL (PF) 1 % IJ SOLN
INTRAMUSCULAR | Status: AC
  Filled 2024-08-16: qty 60

## 2024-08-16 MED ORDER — FENTANYL CITRATE (PF) 100 MCG/2ML IJ SOLN
INTRAMUSCULAR | Status: AC
  Filled 2024-08-16: qty 2

## 2024-08-16 MED ORDER — CEFAZOLIN SODIUM-DEXTROSE 2-4 GM/100ML-% IV SOLN
INTRAVENOUS | Status: AC | PRN
  Administered 2024-08-16: 2 g via INTRAVENOUS

## 2024-08-16 MED ORDER — DIPHENHYDRAMINE HCL 50 MG/ML IJ SOLN
INTRAMUSCULAR | Status: AC
  Filled 2024-08-16: qty 1

## 2024-08-16 MED ORDER — CEFAZOLIN SODIUM-DEXTROSE 2-4 GM/100ML-% IV SOLN
INTRAVENOUS | Status: AC
  Filled 2024-08-16: qty 100

## 2024-08-16 MED ORDER — LIDOCAINE HCL (PF) 1 % IJ SOLN
30.0000 mL | Freq: Once | INTRAMUSCULAR | Status: AC
  Administered 2024-08-16: 30 mL via INTRADERMAL

## 2024-08-16 MED ORDER — DIPHENHYDRAMINE HCL 50 MG/ML IJ SOLN
INTRAMUSCULAR | Status: AC | PRN
  Administered 2024-08-16 (×2): 25 mg via INTRAVENOUS

## 2024-08-16 NOTE — Telephone Encounter (Signed)
 Review of chart shows that patient arrived at hospital for appointment with Dr. Lester this morning.

## 2024-08-16 NOTE — Progress Notes (Signed)
 Feels well this morning other than anxiety about the procedure. Targeted pre sedation physical WNL

## 2024-08-16 NOTE — Brief Op Note (Signed)
" °  NEUROSURGERY BRIEF OP NOTE   PREOP DX: T7, T8, T11 compression fractures  POSTOP DX: Same  PROCEDURE: Vertebroplasty  SURGEON: Nancyann LULLA Burns   ANESTHESIA: IV Sedation with Local, 3 mg Versed , 170 mcg fentanyl , 50 mg Benadryl   EBL: Minimal  COMPLICATIONS: No immediate  CONDITION: Stable  FINDINGS:  T7, T8, T11 vertebroplasty without complication  Nancyann LULLA Burns  @today @ 11:34 AM  "

## 2024-08-16 NOTE — Telephone Encounter (Signed)
 Pt spoke with triage nurse. She has questions regarding surgery, She is concerned about weather and wants to confirm surgery at 8 am pt states that she has been at the hospital since 6 am.

## 2024-08-16 NOTE — Telephone Encounter (Signed)
 Patient spoke to triage nurse. She has questions regarding her surgery. She is concerned about the weather and wants to confirm her surgery.
